# Patient Record
Sex: Male | Born: 1935 | Race: Black or African American | Hispanic: No | Marital: Married | Smoking: Never smoker
Health system: Southern US, Community
[De-identification: ages and names within clinical notes are randomized; demographics above are authoritative.]

## PROBLEM LIST (undated history)

## (undated) DIAGNOSIS — E119 Type 2 diabetes mellitus without complications: Secondary | ICD-10-CM

## (undated) DIAGNOSIS — K439 Ventral hernia without obstruction or gangrene: Secondary | ICD-10-CM

## (undated) DIAGNOSIS — E782 Mixed hyperlipidemia: Secondary | ICD-10-CM

## (undated) DIAGNOSIS — R809 Proteinuria, unspecified: Secondary | ICD-10-CM

## (undated) DIAGNOSIS — F329 Major depressive disorder, single episode, unspecified: Secondary | ICD-10-CM

## (undated) DIAGNOSIS — I2510 Atherosclerotic heart disease of native coronary artery without angina pectoris: Secondary | ICD-10-CM

## (undated) DIAGNOSIS — I639 Cerebral infarction, unspecified: Secondary | ICD-10-CM

## (undated) DIAGNOSIS — E039 Hypothyroidism, unspecified: Secondary | ICD-10-CM

## (undated) DIAGNOSIS — N400 Enlarged prostate without lower urinary tract symptoms: Secondary | ICD-10-CM

## (undated) DIAGNOSIS — F32A Depression, unspecified: Secondary | ICD-10-CM

## (undated) DIAGNOSIS — I10 Essential (primary) hypertension: Secondary | ICD-10-CM

## (undated) DIAGNOSIS — K279 Peptic ulcer, site unspecified, unspecified as acute or chronic, without hemorrhage or perforation: Secondary | ICD-10-CM

## (undated) DIAGNOSIS — K420 Umbilical hernia with obstruction, without gangrene: Secondary | ICD-10-CM

## (undated) HISTORY — DX: Ventral hernia without obstruction or gangrene: K43.9

## (undated) HISTORY — DX: Depression, unspecified: F32.A

## (undated) HISTORY — DX: Mixed hyperlipidemia: E78.2

## (undated) HISTORY — DX: Proteinuria, unspecified: R80.9

## (undated) HISTORY — DX: Essential (primary) hypertension: I10

## (undated) HISTORY — DX: Major depressive disorder, single episode, unspecified: F32.9

## (undated) HISTORY — DX: Benign prostatic hyperplasia without lower urinary tract symptoms: N40.0

## (undated) HISTORY — DX: Umbilical hernia with obstruction, without gangrene: K42.0

## (undated) HISTORY — DX: Peptic ulcer, site unspecified, unspecified as acute or chronic, without hemorrhage or perforation: K27.9

## (undated) HISTORY — PX: MULTIPLE TOOTH EXTRACTIONS: SHX2053

## (undated) HISTORY — DX: Type 2 diabetes mellitus without complications: E11.9

## (undated) HISTORY — DX: Atherosclerotic heart disease of native coronary artery without angina pectoris: I25.10

## (undated) HISTORY — DX: Cerebral infarction, unspecified: I63.9

## (undated) HISTORY — DX: Hypothyroidism, unspecified: E03.9

---

## 2007-01-15 ENCOUNTER — Encounter: Payer: Self-pay | Admitting: Cardiology

## 2008-09-16 ENCOUNTER — Encounter (INDEPENDENT_AMBULATORY_CARE_PROVIDER_SITE_OTHER): Payer: Self-pay | Admitting: Emergency Medicine

## 2008-09-16 ENCOUNTER — Emergency Department (HOSPITAL_COMMUNITY): Admission: EM | Admit: 2008-09-16 | Discharge: 2008-09-16 | Payer: Self-pay | Admitting: Emergency Medicine

## 2008-09-16 ENCOUNTER — Ambulatory Visit: Payer: Self-pay | Admitting: Surgery

## 2008-09-29 ENCOUNTER — Ambulatory Visit: Payer: Self-pay | Admitting: Family Medicine

## 2008-09-29 DIAGNOSIS — E1122 Type 2 diabetes mellitus with diabetic chronic kidney disease: Secondary | ICD-10-CM

## 2008-09-29 DIAGNOSIS — I10 Essential (primary) hypertension: Secondary | ICD-10-CM | POA: Insufficient documentation

## 2008-09-29 DIAGNOSIS — N400 Enlarged prostate without lower urinary tract symptoms: Secondary | ICD-10-CM | POA: Insufficient documentation

## 2008-09-29 DIAGNOSIS — Z794 Long term (current) use of insulin: Secondary | ICD-10-CM

## 2008-09-29 DIAGNOSIS — N183 Chronic kidney disease, stage 3 (moderate): Secondary | ICD-10-CM

## 2008-09-29 DIAGNOSIS — E6609 Other obesity due to excess calories: Secondary | ICD-10-CM

## 2008-09-29 DIAGNOSIS — Z6833 Body mass index (BMI) 33.0-33.9, adult: Secondary | ICD-10-CM

## 2008-09-29 DIAGNOSIS — M109 Gout, unspecified: Secondary | ICD-10-CM | POA: Insufficient documentation

## 2008-09-29 HISTORY — DX: Other obesity due to excess calories: E66.09

## 2008-09-29 HISTORY — DX: Type 2 diabetes mellitus with diabetic chronic kidney disease: E11.22

## 2008-09-29 LAB — CONVERTED CEMR LAB
Hgb A1c MFr Bld: 7.9 %
Protein, U semiquant: 30
Urobilinogen, UA: 1
pH: 5.5

## 2008-10-04 ENCOUNTER — Encounter (INDEPENDENT_AMBULATORY_CARE_PROVIDER_SITE_OTHER): Payer: Self-pay | Admitting: Family Medicine

## 2008-10-06 ENCOUNTER — Encounter (INDEPENDENT_AMBULATORY_CARE_PROVIDER_SITE_OTHER): Payer: Self-pay | Admitting: Family Medicine

## 2008-10-09 LAB — CONVERTED CEMR LAB
BUN: 24 mg/dL — ABNORMAL HIGH (ref 6–23)
CO2: 23 meq/L (ref 19–32)
Calcium: 9.1 mg/dL (ref 8.4–10.5)
Chloride: 105 meq/L (ref 96–112)
Cholesterol: 216 mg/dL — ABNORMAL HIGH (ref 0–200)
Creatinine, Ser: 1.26 mg/dL (ref 0.40–1.50)
Eosinophils Absolute: 0.1 10*3/uL (ref 0.0–0.7)
Eosinophils Relative: 2 % (ref 0–5)
Glucose, Bld: 159 mg/dL — ABNORMAL HIGH (ref 70–99)
HCT: 39.3 % (ref 39.0–52.0)
HDL: 56 mg/dL (ref >39)
Lymphs Abs: 1.2 10*3/uL (ref 0.7–4.0)
MCV: 86.4 fL (ref 78.0–100.0)
Monocytes Absolute: 0.2 10*3/uL (ref 0.1–1.0)
Monocytes Relative: 5 % (ref 3–12)
Platelets: 195 10*3/uL (ref 150–400)
RBC: 4.55 M/uL (ref 4.22–5.81)
Total CHOL/HDL Ratio: 3.9
WBC: 3.8 10*3/uL — ABNORMAL LOW (ref 4.0–10.5)

## 2008-10-13 ENCOUNTER — Ambulatory Visit: Payer: Self-pay | Admitting: Family Medicine

## 2008-10-13 DIAGNOSIS — E782 Mixed hyperlipidemia: Secondary | ICD-10-CM | POA: Insufficient documentation

## 2008-10-13 LAB — CONVERTED CEMR LAB: HDL goal, serum: 40 mg/dL

## 2008-10-30 ENCOUNTER — Ambulatory Visit: Payer: Self-pay | Admitting: Internal Medicine

## 2008-10-30 LAB — CONVERTED CEMR LAB
Bilirubin Urine: NEGATIVE
Blood Glucose, Fingerstick: 147
Ketones, urine, test strip: NEGATIVE
Nitrite: NEGATIVE
Specific Gravity, Urine: 1.025

## 2008-11-09 ENCOUNTER — Encounter (INDEPENDENT_AMBULATORY_CARE_PROVIDER_SITE_OTHER): Payer: Self-pay | Admitting: Family Medicine

## 2008-11-21 ENCOUNTER — Ambulatory Visit: Payer: Self-pay | Admitting: Family Medicine

## 2008-11-21 DIAGNOSIS — E1149 Type 2 diabetes mellitus with other diabetic neurological complication: Secondary | ICD-10-CM | POA: Insufficient documentation

## 2008-11-21 LAB — CONVERTED CEMR LAB: Glucose, Bld: 223 mg/dL

## 2008-11-22 ENCOUNTER — Encounter (INDEPENDENT_AMBULATORY_CARE_PROVIDER_SITE_OTHER): Payer: Self-pay | Admitting: Family Medicine

## 2008-11-22 LAB — CONVERTED CEMR LAB: TSH: 8.147 microintl units/mL — ABNORMAL HIGH (ref 0.350–4.500)

## 2008-11-29 ENCOUNTER — Ambulatory Visit: Payer: Self-pay | Admitting: Family Medicine

## 2008-11-30 ENCOUNTER — Encounter (INDEPENDENT_AMBULATORY_CARE_PROVIDER_SITE_OTHER): Payer: Self-pay | Admitting: *Deleted

## 2008-12-02 ENCOUNTER — Inpatient Hospital Stay (HOSPITAL_COMMUNITY): Admission: EM | Admit: 2008-12-02 | Discharge: 2008-12-04 | Payer: Self-pay | Admitting: Emergency Medicine

## 2008-12-02 ENCOUNTER — Telehealth (INDEPENDENT_AMBULATORY_CARE_PROVIDER_SITE_OTHER): Payer: Self-pay | Admitting: Family Medicine

## 2008-12-10 ENCOUNTER — Emergency Department (HOSPITAL_COMMUNITY): Admission: EM | Admit: 2008-12-10 | Discharge: 2008-12-10 | Payer: Self-pay

## 2008-12-13 ENCOUNTER — Encounter (INDEPENDENT_AMBULATORY_CARE_PROVIDER_SITE_OTHER): Payer: Self-pay | Admitting: Family Medicine

## 2008-12-21 ENCOUNTER — Telehealth (INDEPENDENT_AMBULATORY_CARE_PROVIDER_SITE_OTHER): Payer: Self-pay | Admitting: *Deleted

## 2008-12-25 ENCOUNTER — Telehealth (INDEPENDENT_AMBULATORY_CARE_PROVIDER_SITE_OTHER): Payer: Self-pay | Admitting: *Deleted

## 2009-01-02 ENCOUNTER — Ambulatory Visit: Payer: Self-pay | Admitting: Family Medicine

## 2009-01-11 ENCOUNTER — Encounter (INDEPENDENT_AMBULATORY_CARE_PROVIDER_SITE_OTHER): Payer: Self-pay | Admitting: *Deleted

## 2009-01-11 LAB — CONVERTED CEMR LAB
Albumin: 4 g/dL (ref 3.5–5.2)
Alkaline Phosphatase: 67 units/L (ref 39–117)
BUN: 20 mg/dL (ref 6–23)
CO2: 26 meq/L (ref 19–32)
Cholesterol: 156 mg/dL (ref 0–200)
Glucose, Bld: 117 mg/dL — ABNORMAL HIGH (ref 70–99)
HDL: 52 mg/dL (ref >39)
LDL Cholesterol: 94 mg/dL (ref 0–99)
Potassium: 5.7 meq/L — ABNORMAL HIGH (ref 3.5–5.3)
Triglycerides: 52 mg/dL (ref <150)

## 2009-01-16 ENCOUNTER — Ambulatory Visit: Payer: Self-pay | Admitting: Family Medicine

## 2009-01-16 ENCOUNTER — Ambulatory Visit (HOSPITAL_COMMUNITY): Admission: RE | Admit: 2009-01-16 | Discharge: 2009-01-16 | Payer: Self-pay | Admitting: Family Medicine

## 2009-01-16 LAB — CONVERTED CEMR LAB
Cholesterol, target level: 200 mg/dL
HDL goal, serum: 40 mg/dL
LDL Goal: 70 mg/dL

## 2009-01-17 ENCOUNTER — Encounter (INDEPENDENT_AMBULATORY_CARE_PROVIDER_SITE_OTHER): Payer: Self-pay | Admitting: Family Medicine

## 2009-01-17 LAB — CONVERTED CEMR LAB
Potassium: 5.3 meq/L (ref 3.5–5.3)
Sed Rate: 24 mm/hr — ABNORMAL HIGH (ref 0–16)

## 2009-01-22 ENCOUNTER — Telehealth (INDEPENDENT_AMBULATORY_CARE_PROVIDER_SITE_OTHER): Payer: Self-pay | Admitting: *Deleted

## 2009-01-23 ENCOUNTER — Encounter (INDEPENDENT_AMBULATORY_CARE_PROVIDER_SITE_OTHER): Payer: Self-pay | Admitting: Family Medicine

## 2009-01-24 ENCOUNTER — Telehealth (INDEPENDENT_AMBULATORY_CARE_PROVIDER_SITE_OTHER): Payer: Self-pay | Admitting: *Deleted

## 2009-01-29 ENCOUNTER — Telehealth (INDEPENDENT_AMBULATORY_CARE_PROVIDER_SITE_OTHER): Payer: Self-pay | Admitting: *Deleted

## 2009-02-15 ENCOUNTER — Encounter (INDEPENDENT_AMBULATORY_CARE_PROVIDER_SITE_OTHER): Payer: Self-pay | Admitting: Family Medicine

## 2009-02-19 ENCOUNTER — Ambulatory Visit: Payer: Self-pay | Admitting: Family Medicine

## 2009-02-27 ENCOUNTER — Ambulatory Visit: Payer: Self-pay | Admitting: Family Medicine

## 2009-02-27 ENCOUNTER — Telehealth (INDEPENDENT_AMBULATORY_CARE_PROVIDER_SITE_OTHER): Payer: Self-pay | Admitting: *Deleted

## 2009-02-28 ENCOUNTER — Encounter (INDEPENDENT_AMBULATORY_CARE_PROVIDER_SITE_OTHER): Payer: Self-pay | Admitting: Family Medicine

## 2009-02-28 LAB — CONVERTED CEMR LAB
Calcium: 8.7 mg/dL (ref 8.4–10.5)
Creatinine, Ser: 1.34 mg/dL (ref 0.40–1.50)
Sodium: 140 meq/L (ref 135–145)

## 2009-03-01 LAB — CONVERTED CEMR LAB: TSH: 2.161 microintl units/mL (ref 0.350–4.500)

## 2009-03-20 ENCOUNTER — Ambulatory Visit (HOSPITAL_COMMUNITY): Admission: RE | Admit: 2009-03-20 | Discharge: 2009-03-20 | Payer: Self-pay | Admitting: Ophthalmology

## 2009-04-25 ENCOUNTER — Encounter: Payer: Self-pay | Admitting: Cardiology

## 2010-04-15 ENCOUNTER — Encounter: Payer: Self-pay | Admitting: Cardiology

## 2010-04-16 ENCOUNTER — Ambulatory Visit: Payer: Self-pay | Admitting: Cardiology

## 2010-07-17 ENCOUNTER — Encounter: Payer: Self-pay | Admitting: Cardiology

## 2010-08-08 NOTE — Letter (Signed)
Summary: External Correspondence/ [facility]  External Correspondence/ [facility]   Imported By: Bartholomew Boards 07/25/2010 16:09:29  _____________________________________________________________________  External Attachment:    Type:   Image     Comment:   External Document

## 2010-08-30 ENCOUNTER — Encounter: Payer: Self-pay | Admitting: Physician Assistant

## 2010-08-30 ENCOUNTER — Ambulatory Visit (INDEPENDENT_AMBULATORY_CARE_PROVIDER_SITE_OTHER): Payer: MEDICARE | Admitting: Cardiology

## 2010-08-30 DIAGNOSIS — R0609 Other forms of dyspnea: Secondary | ICD-10-CM | POA: Insufficient documentation

## 2010-08-30 DIAGNOSIS — R0602 Shortness of breath: Secondary | ICD-10-CM

## 2010-08-30 DIAGNOSIS — I10 Essential (primary) hypertension: Secondary | ICD-10-CM

## 2010-08-30 DIAGNOSIS — R0989 Other specified symptoms and signs involving the circulatory and respiratory systems: Secondary | ICD-10-CM | POA: Insufficient documentation

## 2010-09-03 ENCOUNTER — Encounter (INDEPENDENT_AMBULATORY_CARE_PROVIDER_SITE_OTHER): Payer: MEDICARE

## 2010-09-03 DIAGNOSIS — R002 Palpitations: Secondary | ICD-10-CM

## 2010-09-03 NOTE — Assessment & Plan Note (Signed)
Summary: np6 hypertension cardiomy low et/ac   Visit Type:  Initial Consult Primary Provider:  Judd Lien, MD   History of Present Illness: 77 year old male presents for initial consultation, for evaluation of chronic DOE.  Patient has never had a formal cardiac evaluation, but did have an exercise stress Cardiolite, here at [facility], in October 2011, per Dr. Lizbeth Bark request. This was reviewed by Dr. Dannielle Burn, who concluded that this was most likely consistent with hypertensive, dilated cardio myopathy. The study yielded moderate LVD (EF 40%), with moderate global HK; a small, fixed inferobasal defect, associated with moderate HK, consistent with attenuation. there was also severe hypertensive response (233/108), at peak stress.  Clinically, the patient denies any history of exertional chest discomfort. What he has is significant DOE with prolonged walking, which is then associated with nausea and vomiting. He denies any shortness of breath at rest, or orthopnea, PND, or significant lower extremity edema. He also denies any significant progression of these symptoms, over the past 6 months.  Preventive Screening-Counseling & Management  Alcohol-Tobacco     Smoking Status: never  Current Medications (verified): 1)  Metformin Hcl 1000 Mg Tabs (Metformin Hcl) .... Take 1 Tablet By Mouth Two Times A Day 2)  Citalopram Hydrobromide 20 Mg Tabs (Citalopram Hydrobromide) .... One Daily For Anxiety/depression 3)  Lisinopril 20 Mg Tabs (Lisinopril) .... One Daily 4)  Multivitamins  Tabs (Multiple Vitamin) .... Once Daily 5)  Aspirin 81 Mg Tbec (Aspirin) .... Once Daily 6)  Norvasc 10 Mg Tabs (Amlodipine Besylate) .... One Daily 7)  Levothyroxine Sodium 50 Mcg Tabs (Levothyroxine Sodium) .... Take 1 Tablet By Mouth Once A Day 8)  Allopurinol 300 Mg Tabs (Allopurinol) .... One Daily 9)  Lantus Solostar 100 Unit/ml Soln (Insulin Glargine) .... 20 Units Subcutaneously At Bedtime 10)  Pen Needles  29g X 80mm Misc (Insulin Pen Needle) .... Use As Directed 11)  Pravachol 20 Mg Tabs (Pravastatin Sodium) .... One Daily 12)  Colchicine 0.6 Mg Tabs (Colchicine) .... Two Times A Day 13)  Tamsulosin Hcl 0.4 Mg Caps (Tamsulosin Hcl) .... Take 1 Tablet By Mouth Once A Day  Allergies (verified): No Known Drug Allergies  Past History:  Past Medical History: Last updated: 10/30/2008 UNSPECIFIED HYPOTHYROIDISM (ICD-244.9) HYPERLIPIDEMIA (ICD-272.4) OBESITY (ICD-278.00) MEMORY LOSS (ICD-780.93) VENTRAL HERNIA (ICD-553.20) HEARING LOSS (ICD-389.9) PROTEINURIA, MILD (ICD-791.0) GOUT (ICD-274.9) HYPERTROPHY PROSTATE W/O UR OBST & OTH LUTS (ICD-600.00) PUD (ICD-533.90) CEREBROVASCULAR ACCIDENT, HX OF 2008 (ICD-V12.50) DEPRESSION (ICD-311) HYPERTENSION (ICD-401.9) DIABETES MELLITUS, TYPE II, UNCONTROLLED (ICD-250.02)  Review of Systems       No fevers, chills, hemoptysis, dysphagia, melena, hematocheezia, hematuria, rash, orthopnea, pnd, pedal edema. Complains of occasional palpitations. Denies intermittent claudication. All other systems negative.   Vital Signs:  Patient profile:   77 year old male Height:      74 inches Weight:      275.50 pounds BMI:     35.50 Pulse rate:   69 / minute BP sitting:   179 / 83  (left arm) Cuff size:   regular  Vitals Entered By: Lovina Reach, LPN (February 24, [id number] 1:10 PM) Is Patient Diabetic? Yes   Physical Exam  Additional Exam:  GEN: 38er old male, morbidly obese, no distress HEENT: NCAT,PERRLA,EOMI NECK: palpable pulses, no bruits; unable to assess JVD, secondary to neck girth LUNGS: CTA bilaterally HEART: RRR (S1S2); no significant murmurs; premature beats ABD: soft, NT; intact BS EXT: nonpalpable peripheral pulses; no significant edema MUSC: no obvious deformity NEURO: A/O (x3)  EKG  Procedure date:  08/30/2010  Findings:      a 12-lead EKG, per Dr. Lizbeth Bark office, indicates normal sinus rhythm with frequent single PVCs;  LAD; borderline first degree AV block  Impression & Recommendations:  Problem # 1:  DYSPNEA ON EXERTION (ICD-786.09)  we'll order a 2-D echocardiogram for reassessment of LV function, and rule out underlying structural abnormalities. We suspected that based on his previous stress test result, he may, in fact, have a nonischemic cardiomyopathy, secondary to hypertension. he will ultimately require coronary angiography to exclude significant CAD, and will address this issue when he returns to [place] for early followup. With respect to medications, we will start carvedilol 3.125 mg b.i.d., for aggressive BP control, as well as initial treatment for his cardiomyopathy. Of note, he is already on an ACE inhibitor.  Orders: T-TSH [phone]) Holter Monitor (Holter Monitor) 2-D Echocardiogram (2D Echo)  Problem # 2:  PALPITATIONS (ICD-785.1)  we'll further evaluate with a 48 hour Holter monitor. Check TSH level.  Orders: T-TSH [phone]) Holter Monitor (Holter Monitor)  Problem # 3:  HYPERTENSION (ICD-401.9)  have initiated carvedilol for aggressive management.  Problem # 4:  DIABETES MELLITUS, TYPE II, UNCONTROLLED (ICD-250.02) Assessment: Comment Only  Problem # 5:  HYPERLIPIDEMIA (ICD-272.4) Assessment: Comment Only  His updated medication list for this problem includes:    Pravachol 20 Mg Tabs (Pravastatin sodium) ..... One daily  His updated medication list for this problem includes:    Pravachol 20 Mg Tabs (Pravastatin sodium) ..... One daily  Patient Instructions: 1)  Follow up with Dr. Domenic Polite on  FRIDAY October 04, 2010 AT [place]. 2)  Your physician has requested that you have an echocardiogram.  Echocardiography is a painless test that uses sound waves to create images of your heart. It provides your doctor with information about the size and shape of your heart and how well your heart's chambers and valves are working.  This procedure takes approximately one hour. There are no  restrictions for this procedure. 3)  Your physician has recommended that you wear a holter monitor.  Holter monitors are medical devices that record the heart's electrical activity. Doctors most often use these monitors to diagnose arrhythmias. Arrhythmias are problems with the speed or rhythm of the heartbeat. The monitor is a small, portable device. You can wear one while you do your normal daily activities. This is usually used to diagnose what is causing palpitations/syncope (passing out). 4)  Your physician recommends that you go to the [facility] for lab work: TSH. 5)  Start Coreg (carvedilol) 3.125mg  by mouth two times a day. Prescriptions: CARVEDILOL 3.125 MG TABS (CARVEDILOL) Take one tablet by mouth twice a day  #60 x 6   Entered by:   Gurney Maxin, RN, BSN   Authorized by:   Maryjane Hurter, PA-C   Signed by:   Gurney Maxin, RN, BSN on 08/30/2010   Method used:   Electronically to        [facility] (retail)       [address]       [place], [place]  [address]       Ph: [phone]       Fax: [phone]   RxIDGO:[id number]   Handout requested.  I have reviewed and approved all prescriptions at the time of the office visit. Maryjane Hurter, PA-C  August 30, 2010 3:12 PM

## 2010-09-05 ENCOUNTER — Encounter: Payer: Self-pay | Admitting: Physician Assistant

## 2010-09-05 DIAGNOSIS — R0602 Shortness of breath: Secondary | ICD-10-CM

## 2010-09-09 ENCOUNTER — Encounter: Payer: Self-pay | Admitting: Physician Assistant

## 2010-09-12 NOTE — Letter (Signed)
Summary: Internal Other/ PATIENT HISTORY FORM  Internal Other/ PATIENT HISTORY FORM   Imported By: Bartholomew Boards 09/02/2010 15:18:05  _____________________________________________________________________  External Attachment:    Type:   Image     Comment:   External Document

## 2010-09-17 NOTE — Procedures (Addendum)
Summary: Holter Final Summary  Holter Final Summary   Imported By: Gurney Maxin, RN, BSN 09/11/2010 11:21:13  _____________________________________________________________________  External Attachment:    Type:   Image     Comment:   External Document  Appended Document: Holter Final Summary Left message to call back on voicemail.  Appended Document: Holter Final Summary Pt notified of results and verbalized understanding.

## 2010-09-20 ENCOUNTER — Encounter: Payer: Self-pay | Admitting: Physician Assistant

## 2010-09-23 ENCOUNTER — Encounter: Payer: Self-pay | Admitting: *Deleted

## 2010-10-02 ENCOUNTER — Encounter: Payer: Self-pay | Admitting: *Deleted

## 2010-10-03 ENCOUNTER — Encounter: Payer: Self-pay | Admitting: Cardiology

## 2010-10-03 NOTE — Letter (Signed)
Summary: Risk analyst at [facility]. [address]   [place], [place] [address]   Phone: [phone]  Fax: [phone]        September 23, 2010 MRN: [id number]    [facility] [address] [place], [place]  [address]    Dear Mr. Rodick,  Your test ordered by Rande Lawman has been reviewed by your physician (or physician assistant) and was found to be normal or stable. Your physician (or physician assistant) felt no changes were needed at this time.  ____ Echocardiogram  ____ Cardiac Stress Test  __X__ Lab Work-T3 NORMAL-FURTHER THYROID F/U WITH DR. Pleas Koch  ____ Peripheral vascular study of arms, legs or neck  ____ CT scan or X-ray  ____ Lung or Breathing test  ____ Other:   Thank you.   Gurney Maxin, RN, BSN    Bryon Lions, M.D., F.A.C.C. Maceo Pro, M.D., F.A.C.C. Cammy Copa, M.D., F.A.C.C. Vonda Antigua, M.D., F.A.C.C. Vita Barley, M.D., F.A.C.C. Mare Ferrari, M.D., F.A.C.C. Emi Belfast, PA-C

## 2010-10-04 ENCOUNTER — Encounter: Payer: Self-pay | Admitting: Cardiology

## 2010-10-04 ENCOUNTER — Encounter: Payer: Self-pay | Admitting: *Deleted

## 2010-10-04 ENCOUNTER — Ambulatory Visit (INDEPENDENT_AMBULATORY_CARE_PROVIDER_SITE_OTHER): Payer: MEDICARE | Admitting: Cardiology

## 2010-10-04 VITALS — BP 161/82 | HR 71 | Ht 74.0 in | Wt 271.0 lb

## 2010-10-04 DIAGNOSIS — R0989 Other specified symptoms and signs involving the circulatory and respiratory systems: Secondary | ICD-10-CM

## 2010-10-04 DIAGNOSIS — I10 Essential (primary) hypertension: Secondary | ICD-10-CM

## 2010-10-04 DIAGNOSIS — R0602 Shortness of breath: Secondary | ICD-10-CM

## 2010-10-04 DIAGNOSIS — R002 Palpitations: Secondary | ICD-10-CM

## 2010-10-04 DIAGNOSIS — R9430 Abnormal result of cardiovascular function study, unspecified: Secondary | ICD-10-CM

## 2010-10-04 NOTE — Assessment & Plan Note (Signed)
Holter monitor reviewed, showing both ventricular and atrial ectopy, some frequent ventricular ectopy with bigeminy and couplets. No sustained events.

## 2010-10-04 NOTE — Assessment & Plan Note (Signed)
Blood pressure trend is better, although not yet optimal. Coreg was most recent addition to therapy.

## 2010-10-04 NOTE — Progress Notes (Signed)
Clinical Summary Paul Wood is a 77 y.o.male presenting for routine followup. He was seen recently by Paul Wood in late February with history of chronic dyspnea on exertion. Prior exercise Cardiolite from October 2011 had indicated possible nonischemic cardiomyopathy with LVEF 40% and global hypokinesis. Followup echocardiogram however revealed an LVEF of 55-60% with mild LVH, mild left atrial enlargement, normal right ventricular function, and mild aortic root dilatation.  Followup lab work included a TSH found to be 0.10 and normal T3 uptake of 45. Holter monitor showed sinus rhythm ranging from 45 beats per minute to 124 beats per minute, frequent PVCs with bigeminy and some couplets, brief PAT.  He continues to report NYHA class III dyspnea on exertion. This has been functionally limiting over the last several months. He denies any exertional chest pain at this point. No dizziness or syncope. He states that these symptoms have been getting worse over the last few years, and that he used to walk regularly, up to several miles at a time with no limitation.   No Known Allergies   Current outpatient prescriptions:allopurinol (ZYLOPRIM) 300 MG tablet, Take 300 mg by mouth daily.  , Disp: , Rfl: ;  amLODipine (NORVASC) 10 MG tablet, Take 10 mg by mouth daily.  , Disp: , Rfl: ;  aspirin 81 MG tablet, Take 81 mg by mouth daily.  , Disp: , Rfl: ;  carvedilol (COREG) 3.125 MG tablet, Take 3.125 mg by mouth 2 (two) times daily with a meal.  , Disp: , Rfl:  citalopram (CELEXA) 20 MG tablet, Take 20 mg by mouth daily.  , Disp: , Rfl: ;  colchicine 0.6 MG tablet, Take one by mouth twice daily  , Disp: , Rfl: ;  insulin glargine (LANTUS SOLOSTAR) 100 UNIT/ML injection, Inject into the skin at bedtime.  , Disp: , Rfl: ;  levothyroxine (SYNTHROID, LEVOTHROID) 50 MCG tablet, Take 50 mcg by mouth daily.  , Disp: , Rfl: ;  lisinopril (PRINIVIL,ZESTRIL) 20 MG tablet, Take 20 mg by mouth daily.  , Disp: , Rfl:  metFORMIN  (GLUCOPHAGE) 1000 MG tablet, Take 1,000 mg by mouth 2 (two) times daily with a meal.  , Disp: , Rfl: ;  Multiple Vitamin (MULTIVITAMIN) tablet, Take 1 tablet by mouth daily.  , Disp: , Rfl: ;  pravastatin (PRAVACHOL) 20 MG tablet, Take 20 mg by mouth daily.  , Disp: , Rfl: ;  Tamsulosin HCl (FLOMAX) 0.4 MG CAPS, Take one by mouth daily  , Disp: , Rfl:    Past Medical History  Diagnosis Date  . Hypothyroidism   . Mixed hyperlipidemia   . Ventral hernia   . Hearing loss   . Proteinuria     Mild  . Gout   . Hypertrophy of prostate without urinary obstruction and other lower urinary tract symptoms (LUTS)   . PUD (peptic ulcer disease)   . Stroke     2008  . Depression   . Essential hypertension, benign   . Type 2 diabetes mellitus     Past Surgical History  Procedure Date  . Multiple tooth extractions     HX DENTAL CARIES/ALMOST ALL REMOVED    Family History  Problem Relation Age of Onset  . Gout Father     DIED AGE 87-HOUSE FIRE  . Alzheimer's disease Mother     DIED AGE 65/uncertain hx  . Other Brother     2 brothers deceased:1-gun shot wound & 1-hypothermia(froze to death)  . Colon cancer Sister  56 sisters:1-colon cancer age 46's  . Gout Son     age 20  . Hypertension Son     age 14  . Hyperlipidemia Son     age 68  . Other Daughter     age 59:leg problems,Knee OA    Social History Paul Wood reports that he has never smoked. He has never used smokeless tobacco. Paul Wood reports that he does not drink alcohol.  Review of Systems No fevers, chills, or unusual weight change. No chest pain, cough, hemoptysis, or wheezing. No syncope. No dysphasia or odynophagia. Stable appetite with no abdominal pain, melena, or hematochezia. No orthopnea, PND, or lower extremity edema. No focal motor weakness, memory problems, or speech deficits. Otherwise systems reviewed and negative except as already outlined.   Physical Examination Filed Vitals:   10/04/10 1104  BP:  161/82  Pulse: 71  GEN: 78er old male, morbidly obese, no distress HEENT: NCAT,PERRLA,EOMI NECK: palpable pulses, no bruits; unable to assess JVD, secondary to neck girth LUNGS: CTA bilaterally HEART: RRR (S1S2); no significant murmurs; premature beats ABD: soft, NT; intact BS EXT: nonpalpable peripheral pulses; no significant edema MUSC: no obvious deformity NEURO: A/O (x3)    Problem List and Plan

## 2010-10-04 NOTE — Assessment & Plan Note (Signed)
As noted below, plan is to pursue further testing, specifically a left and right heart catheterization for definitive ischemic evaluation and hemodynamic workup. LVEF is normal by followup echocardiogram.

## 2010-10-04 NOTE — Patient Instructions (Signed)
Follow up as scheduled. Your physician recommends that you go to the [facility] for lab work and a chest x-ray. Do on Monday or Tuesday (4/2-4/3). Your physician has requested that you have a cardiac catheterization. Cardiac catheterization is used to diagnose and/or treat various heart conditions. Doctors may recommend this procedure for a number of different reasons. The most common reason is to evaluate chest pain. Chest pain can be a symptom of coronary artery disease (CAD), and cardiac catheterization can show whether plaque is narrowing or blocking your heart's arteries. This procedure is also used to evaluate the valves, as well as measure the blood flow and oxygen levels in different parts of your heart. For further information please visit [email or web address]. Please follow instruction sheet, as given.

## 2010-10-04 NOTE — Assessment & Plan Note (Signed)
Exercise Cardiolite from October 2011 suggested the possibility of a dilated cardiomyopathy with LVEF 40%, however recent echocardiogram shows normal function. Fixed inferobasal defect was also described, possibly attenuation. Patient was significantly hypertensive at that point. TID Ratio was somewhat increased at 1.17. In light of continued dyspnea on exertion, plan is to proceed with a left and right heart catheterization to clearly assess coronary anatomy, exclude potential for balanced ischemia, and also assess hemodynamics. Risks and benefits were discussed, and patient is in agreement to proceed.

## 2010-10-07 LAB — PROTIME-INR

## 2010-10-11 LAB — BASIC METABOLIC PANEL
Calcium: 9.2 mg/dL (ref 8.4–10.5)
Creatinine, Ser: 1.4 mg/dL (ref 0.4–1.5)
GFR calc Af Amer: 60 mL/min — ABNORMAL LOW (ref >60)
GFR calc non Af Amer: 49 mL/min — ABNORMAL LOW (ref >60)
Sodium: 143 mEq/L (ref 135–145)

## 2010-10-11 LAB — GLUCOSE, CAPILLARY: Glucose-Capillary: 86 mg/dL (ref 70–99)

## 2010-10-11 LAB — HEMOGLOBIN AND HEMATOCRIT, BLOOD: HCT: 34.2 % — ABNORMAL LOW (ref 39.0–52.0)

## 2010-10-14 ENCOUNTER — Inpatient Hospital Stay (HOSPITAL_BASED_OUTPATIENT_CLINIC_OR_DEPARTMENT_OTHER)
Admission: RE | Admit: 2010-10-14 | Discharge: 2010-10-14 | Disposition: A | Discharge status: Other Acute Inpatient Hospital | Payer: MEDICARE | Source: Ambulatory Visit | Attending: Cardiovascular Disease | Admitting: Cardiovascular Disease

## 2010-10-14 ENCOUNTER — Observation Stay (HOSPITAL_COMMUNITY)
Admission: RE | Admit: 2010-10-14 | Discharge: 2010-10-15 | Disposition: A | Discharge status: Home / Self Care | Payer: MEDICARE | Source: Ambulatory Visit | Attending: Cardiovascular Disease | Admitting: Cardiovascular Disease

## 2010-10-14 DIAGNOSIS — E785 Hyperlipidemia, unspecified: Secondary | ICD-10-CM | POA: Insufficient documentation

## 2010-10-14 DIAGNOSIS — Z8711 Personal history of peptic ulcer disease: Secondary | ICD-10-CM | POA: Insufficient documentation

## 2010-10-14 DIAGNOSIS — E119 Type 2 diabetes mellitus without complications: Secondary | ICD-10-CM | POA: Insufficient documentation

## 2010-10-14 DIAGNOSIS — R0609 Other forms of dyspnea: Secondary | ICD-10-CM | POA: Insufficient documentation

## 2010-10-14 DIAGNOSIS — N289 Disorder of kidney and ureter, unspecified: Secondary | ICD-10-CM | POA: Insufficient documentation

## 2010-10-14 DIAGNOSIS — Z01810 Encounter for preprocedural cardiovascular examination: Secondary | ICD-10-CM | POA: Insufficient documentation

## 2010-10-14 DIAGNOSIS — F3289 Other specified depressive episodes: Secondary | ICD-10-CM | POA: Insufficient documentation

## 2010-10-14 DIAGNOSIS — I2510 Atherosclerotic heart disease of native coronary artery without angina pectoris: Secondary | ICD-10-CM

## 2010-10-14 DIAGNOSIS — I428 Other cardiomyopathies: Secondary | ICD-10-CM | POA: Insufficient documentation

## 2010-10-14 DIAGNOSIS — R0989 Other specified symptoms and signs involving the circulatory and respiratory systems: Secondary | ICD-10-CM | POA: Insufficient documentation

## 2010-10-14 DIAGNOSIS — F329 Major depressive disorder, single episode, unspecified: Secondary | ICD-10-CM | POA: Insufficient documentation

## 2010-10-14 DIAGNOSIS — I10 Essential (primary) hypertension: Secondary | ICD-10-CM | POA: Insufficient documentation

## 2010-10-14 DIAGNOSIS — E039 Hypothyroidism, unspecified: Secondary | ICD-10-CM | POA: Insufficient documentation

## 2010-10-14 DIAGNOSIS — M109 Gout, unspecified: Secondary | ICD-10-CM | POA: Insufficient documentation

## 2010-10-14 DIAGNOSIS — Z8673 Personal history of transient ischemic attack (TIA), and cerebral infarction without residual deficits: Secondary | ICD-10-CM | POA: Insufficient documentation

## 2010-10-14 DIAGNOSIS — R9439 Abnormal result of other cardiovascular function study: Secondary | ICD-10-CM | POA: Insufficient documentation

## 2010-10-15 DIAGNOSIS — I200 Unstable angina: Secondary | ICD-10-CM

## 2010-10-15 LAB — GLUCOSE, CAPILLARY
Glucose-Capillary: 102 mg/dL — ABNORMAL HIGH (ref 70–99)
Glucose-Capillary: 145 mg/dL — ABNORMAL HIGH (ref 70–99)
Glucose-Capillary: 184 mg/dL — ABNORMAL HIGH (ref 70–99)
Glucose-Capillary: 216 mg/dL — ABNORMAL HIGH (ref 70–99)
Glucose-Capillary: 228 mg/dL — ABNORMAL HIGH (ref 70–99)
Glucose-Capillary: 241 mg/dL — ABNORMAL HIGH (ref 70–99)
Glucose-Capillary: 286 mg/dL — ABNORMAL HIGH (ref 70–99)

## 2010-10-15 LAB — COMPREHENSIVE METABOLIC PANEL
ALT: 16 U/L (ref 0–53)
AST: 18 U/L (ref 0–37)
CO2: 28 mEq/L (ref 19–32)
Calcium: 9.2 mg/dL (ref 8.4–10.5)
Creatinine, Ser: 1.18 mg/dL (ref 0.4–1.5)
GFR calc Af Amer: >60 mL/min (ref >60)
GFR calc non Af Amer: >60 mL/min (ref >60)
Sodium: 138 mEq/L (ref 135–145)
Total Protein: 7.4 g/dL (ref 6.0–8.3)

## 2010-10-15 LAB — DIFFERENTIAL
Basophils Absolute: 0 10*3/uL (ref 0.0–0.1)
Basophils Relative: 0 % (ref 0–1)
Eosinophils Absolute: 0 10*3/uL (ref 0.0–0.7)
Eosinophils Absolute: 0 10*3/uL (ref 0.0–0.7)
Eosinophils Absolute: 0.1 10*3/uL (ref 0.0–0.7)
Eosinophils Relative: 1 % (ref 0–5)
Eosinophils Relative: 1 % (ref 0–5)
Lymphocytes Relative: 22 % (ref 12–46)
Lymphs Abs: 1.2 10*3/uL (ref 0.7–4.0)
Lymphs Abs: 1.3 10*3/uL (ref 0.7–4.0)
Lymphs Abs: 1.4 10*3/uL (ref 0.7–4.0)
Monocytes Absolute: 0.3 10*3/uL (ref 0.1–1.0)
Monocytes Absolute: 0.4 10*3/uL (ref 0.1–1.0)
Monocytes Relative: 6 % (ref 3–12)
Monocytes Relative: 6 % (ref 3–12)
Monocytes Relative: 6 % (ref 3–12)
Neutro Abs: 4.8 10*3/uL (ref 1.7–7.7)
Neutrophils Relative %: 65 % (ref 43–77)
Neutrophils Relative %: 72 % (ref 43–77)

## 2010-10-15 LAB — BASIC METABOLIC PANEL
CO2: 25 mEq/L (ref 19–32)
CO2: 28 mEq/L (ref 19–32)
Calcium: 8.6 mg/dL (ref 8.4–10.5)
Chloride: 102 mEq/L (ref 96–112)
Chloride: 105 mEq/L (ref 96–112)
Creatinine, Ser: 1.69 mg/dL — ABNORMAL HIGH (ref 0.4–1.5)
GFR calc Af Amer: 48 mL/min — ABNORMAL LOW (ref >60)
GFR calc Af Amer: >60 mL/min (ref >60)
GFR calc Af Amer: >60 mL/min (ref >60)
Potassium: 4.7 mEq/L (ref 3.5–5.1)
Potassium: 5 mEq/L (ref 3.5–5.1)

## 2010-10-15 LAB — CBC
HCT: 36.3 % — ABNORMAL LOW (ref 39.0–52.0)
HCT: 36.7 % — ABNORMAL LOW (ref 39.0–52.0)
HCT: 37.7 % — ABNORMAL LOW (ref 39.0–52.0)
Hemoglobin: 11.1 g/dL — ABNORMAL LOW (ref 13.0–17.0)
MCH: 27.5 pg (ref 26.0–34.0)
MCHC: 32.8 g/dL (ref 30.0–36.0)
MCHC: 34.2 g/dL (ref 30.0–36.0)
MCV: 85.1 fL (ref 78.0–100.0)
MCV: 85.2 fL (ref 78.0–100.0)
MCV: 85.4 fL (ref 78.0–100.0)
Platelets: 195 10*3/uL (ref 150–400)
RBC: 4.27 MIL/uL (ref 4.22–5.81)
RBC: 4.29 MIL/uL (ref 4.22–5.81)
RDW: 14.1 % (ref 11.5–15.5)
RDW: 14.6 % (ref 11.5–15.5)
WBC: 4.6 10*3/uL (ref 4.0–10.5)
WBC: 5.6 10*3/uL (ref 4.0–10.5)

## 2010-10-15 LAB — HEMOGLOBIN A1C
Hgb A1c MFr Bld: 9.2 % — ABNORMAL HIGH (ref 4.6–6.1)
Mean Plasma Glucose: 217 mg/dL

## 2010-10-15 LAB — CULTURE, BLOOD (ROUTINE X 2)
Culture: NO GROWTH
Report Status: 6032010

## 2010-10-15 LAB — POCT I-STAT 3, VENOUS BLOOD GAS (G3P V)
pCO2, Ven: 48.7 mmHg (ref 45.0–50.0)
pH, Ven: 7.362 — ABNORMAL HIGH (ref 7.250–7.300)

## 2010-10-15 LAB — POCT I-STAT 3, ART BLOOD GAS (G3+)
Bicarbonate: 25.7 mEq/L — ABNORMAL HIGH (ref 20.0–24.0)
O2 Saturation: 96 %
TCO2: 27 mmol/L (ref 0–100)
pCO2 arterial: 43.1 mmHg (ref 35.0–45.0)
pH, Arterial: 7.382 (ref 7.350–7.450)
pO2, Arterial: 85 mmHg (ref 80.0–100.0)

## 2010-10-15 LAB — URIC ACID: Uric Acid, Serum: 4.2 mg/dL (ref 4.0–7.8)

## 2010-10-15 LAB — POCT I-STAT GLUCOSE: Operator id: 221371

## 2010-10-15 NOTE — Procedures (Signed)
NAME:  Paul Wood, Paul Wood                ACCOUNT NO.:  [id number]  MEDICAL RECORD NO.:  [id number]           PATIENT TYPE:  O  LOCATION:  [id number]                         FACILITY:  [facility]  PHYSICIAN:  Lauree Chandler, MDDATE OF BIRTH:  16-Aug-1931  DATE OF PROCEDURE:  10/14/2010 DATE OF DISCHARGE:                           CARDIAC CATHETERIZATION   PROCEDURE PERFORMED: 1. Left heart catheterization. 2. Right heart catheterization. 3. Selective coronary angiography. 4. Left ventricular angiogram.  OPERATOR:  Lauree Chandler, MD  INDICATIONS:  This is a 77 year old African American male with a history of hypertension, diabetes mellitus, and hyperlipidemia, who was recently evaluated by Dr. Domenic Polite in the cardiology office with complaints of dyspnea.  The patient had nonischemic cardiomyopathy diagnosed last year; however, recent echocardiogram showed normal left ventricular systolic function.  Exercise Cardiolite in October 2011 showed the possibility of a dilated cardiomyopathy with EF of 40%.  There was a fixed inferobasal defect.  The patient has continued to have dyspnea, and because of this a diagnostic right and left heart catheterization was arranged.  PROCEDURE IN DETAIL:  The patient was brought to the outpatient cardiac catheterization laboratory after signing informed consent for the procedure.  The right groin was prepped and draped in sterile fashion. Lidocaine 1% was used for local anesthesia.  A 4-French sheath was inserted into the right femoral artery without difficulty.  A 6-French sheath was inserted into the right femoral vein without difficulty.  A right heart catheterization was performed with a multipurpose catheter. Selective coronary angiography was then performed.  Initially, a JL-4 catheter was used to engage the left main; however, this was not a good fit for the vessel.  I upsized to a JL-5 catheter which fit much better into the left main;  however, I felt that the catheter size was too small.  We then imaged the right coronary artery with a 3D RC catheter. We then upsized the sheath in the right femoral artery to a 5-French sheath.  We then used a 5-French JL-5 catheter to selectively engage and inject the left coronary system.  A pigtail catheter was used to perform a left ventricular angiogram.  The patient tolerated the procedure well.  HEMODYNAMIC FINDINGS:  Central aortic pressure 199/80, left ventricular pressure 197/21/32, right atrial pressure 4, right ventricular pressure 36/104, pulmonary artery pressure 41/11 with a mean of 24, pulmonary capillary wedge pressure mean of 10, central aortic saturation 96%, pulmonary artery saturation 61%, cardiac output 5.5 L per minute, cardiac index 2.2 L per minute per meter squared.  ANGIOGRAPHIC FINDINGS: 1. The left main artery was free of disease. 2. The left anterior descending was a large vessel that coursed to the     apex and gave off several diagonal branches.  The midportion of the     vessel appeared to have 90% stenosis. 3. The circumflex artery had moderate plaque throughout with a 60%     stenosis in the distal portion of an obtuse marginal branch. 4. The right coronary artery is a large dominant vessel with a 99% mid     stenosis. 5. Left ventricular angiogram was performed in the  RAO projection, it     showed global hypokinesis with ejection fraction of 40% to 45%.  IMPRESSION: 1. Severe double-vessel coronary artery disease. 2. Mild-to-moderate left ventricular systolic dysfunction.  RECOMMENDATIONS:  We will move the patient upstairs to the main cath lab.  We will perform percutaneous coronary intervention of the native right coronary artery as well as the native mid left anterior descending artery.  The patient will be loaded with 600 mg of Plavix here in the outpatient cath lab.  We will treat him with 20 mg of intravenous hydralazine for his elevated  blood pressure.  We will also give 3500 units of intravenous heparin since the patient has a sheath in place and will be waiting several hours for his intervention.     Lauree Chandler, MD     CM/MEDQ  D:  10/14/2010  T:  10/14/2010  Job:  [id number]  Electronically Signed by Lauree Chandler MD on 10/15/2010 10:01:31 AM

## 2010-10-15 NOTE — Procedures (Signed)
NAME:  Paul Wood, Paul Wood NO.:  [id number]  MEDICAL RECORD NO.:  [id number]           PATIENT TYPE:  O  LOCATION:  [facility]                         FACILITY:  [facility]  PHYSICIAN:  Lauree Chandler, MDDATE OF BIRTH:  March 27, 1932  DATE OF PROCEDURE:  10/14/2010 DATE OF DISCHARGE:                           CARDIAC CATHETERIZATION   PRIMARY CARDIOLOGIST:  Satira Sark, MD  PROCEDURES PERFORMED: 1. Percutaneous transluminal coronary angioplasty with placement of     drug-eluting stent in the mid right coronary artery. 2. Percutaneous transluminal coronary angioplasty with placement of a     drug-eluting stent in the distal left anterior descending artery. 3. Percutaneous transluminal coronary angioplasty with placement of     two overlapping drug-eluting stents in the mid left anterior     descending artery.  OPERATOR:  Lauree Chandler, MD  INDICATIONS:  This is a 77 year old African American male with hypertension, hyperlipidemia, diabetes mellitus who underwent a diagnostic catheterization earlier this morning in the outpatient cath lab.  The patient has recently had dyspnea and had an abnormal Myoview. The diagnostic catheterization revealed two areas of severe disease in the left anterior descending artery and severe disease in the mid right coronary artery.  He was brought to the main cath lab for the intervention.  PROCEDURE IN DETAIL:  The patient arrived in the main cath lab with 5- French sheath present in the right femoral artery and a 6-French sheath present in the right femoral vein.  Both of these sheath were changed out for fresh 6-French sheaths.  The area was prepped and draped in a sterile fashion.  The patient was given additional sedation.  The patient was then given a bolus of Angiomax and drip was started.  He had been loaded with 600 mg of Plavix downstairs in the outpatient cath lab. When the ACT was greater than 200, we used a  6-French JR-4 guiding catheter to selectively engage the native right coronary artery.  A Cougar intracoronary wire was passed down the length of the right coronary artery without difficulty.  A 2.5 x 12 mm balloon was used to predilate the lesion.  A 2.5 x 16 mm Promus element drug-eluting stent was carefully positioned in the midvessel and was deployed without difficulty.  A 2.75 x 12 mm noncompliant balloon was inflated inside the stent.  There was an excellent angiographic result.  The stenosis was taken from 99% down to 0%.  At this point, we turned our attention toward the severely diseased left anterior descending artery.  This vessel had a tight stenosis in the midportion followed by a long tubular segment of disease.  Further away past in an area of healthy vessel and the distal vessel, there was another severe stenosis.  I felt that these areas were far enough of a part that we would not be to place overlapping stents between the mid and distal vessel.  Initially, we took a Cougar wire down the LAD into the distal vessel.  We then used a 2.5 x 12 mm balloon to predilate the mid lesion.  Two balloon inflations were performed in the mid lesion.  We then took the balloon down to the distal lesion and predilated this lesion.  A 2.25 x 16 mm Promus Element drug-eluting stent was deployed in the distal lesion.  A 2.25 x 12 mm noncompliant balloon was carefully positioned inside the stent and was inflated.  The stenosis was taken from 90% down to 0%.  We then attempted to pass a long 28-mm stent into the mid vessel.  However, secondary to tortuosity of the vessel calcification and the disease, we were unable to deliver the stent.  We did place a second Cougar wire in an attempt to gain more support in the vessel.  We were unable to pass the stent.  After excessive fluoro time and multiple attempts, we finally elected to place two shorter stents. A 2.75 x 16 mm Promus element  drug-eluting stent was deployed in the distal portion of the mid vessel in the area of severe disease.  A 3.0 x 12 mm Promus element drug-eluting stent was overlapped in the proximal segment of the mid vessel.  Once again, both of these stents were overlapped.  A 3.25 x 20 mm noncompliant balloon was inflated twice inside the stented segments including an area of overlap.  The stenosis was taken from 90% to 0%.  The patient tolerated the procedure well.  Of note, the patient was agitated on the table secondary to oversedation. There were no immediate complications with the procedure.  IMPRESSION: 1. Successful percutaneous transluminal coronary angioplasty with     placement of a drug-eluting stent in the mid right coronary artery. 2. Successful percutaneous transluminal coronary angioplasty with     placement of two overlapping drug-eluting stents in the mid left     anterior descending artery and one stent in the distal left     anterior descending artery.  RECOMMENDATIONS:  The patient will be continued on beta-blocker and a statin.  We will continue his aspirin and Plavix for at least 1 year. He will be watched closely tonight and discharged home tomorrow if he is stable.     Lauree Chandler, MD     CM/MEDQ  D:  10/14/2010  T:  10/15/2010  Job:  [id number]  cc:   Satira Sark, MD  Electronically Signed by Lauree Chandler MD on 10/15/2010 10:01:37 AM

## 2010-10-17 LAB — URINALYSIS, ROUTINE W REFLEX MICROSCOPIC
Bilirubin Urine: NEGATIVE
Nitrite: NEGATIVE
Specific Gravity, Urine: 1.02 (ref 1.005–1.030)
pH: 5.5 (ref 5.0–8.0)

## 2010-10-17 LAB — POCT I-STAT, CHEM 8
Chloride: 107 mEq/L (ref 96–112)
Glucose, Bld: 124 mg/dL — ABNORMAL HIGH (ref 70–99)
HCT: 42 % (ref 39.0–52.0)
Potassium: 4.8 mEq/L (ref 3.5–5.1)
Sodium: 140 mEq/L (ref 135–145)

## 2010-10-18 ENCOUNTER — Encounter: Payer: Self-pay | Admitting: Cardiovascular Disease

## 2010-10-23 NOTE — Discharge Summary (Signed)
Paul Wood, Paul Wood                ACCOUNT NO.:  [id number]  MEDICAL RECORD NO.:  [id number]           PATIENT TYPE:  O  LOCATION:  [id number]                         FACILITY:  [facility]  PHYSICIAN:  Lauree Chandler, MDDATE OF BIRTH:  [birth year]  DATE OF ADMISSION:  10/14/2010 DATE OF DISCHARGE:  10/15/2010                              DISCHARGE SUMMARY   PRIMARY CARDIOLOGIST:  Satira Sark, MD  PRIMARY CARE PROVIDER:  Dr. Penni Bombard.  DISCHARGE DIAGNOSIS:  Unstable angina.  SECONDARY DIAGNOSES: 1. Coronary artery disease, status post percutaneous coronary     intervention and drug-eluting stent placement to the mid and distal     left anterior descending along with the right coronary artery this     admission. 2. Hypertension. 3. Hyperlipidemia. 4. Type 2 diabetes mellitus. 5. Acute renal insufficiency. 6. History of cerebrovascular accident in 2008. 7. Peptic ulcer disease. 8. Gout. 9. Hypothyroidism. 10.Depression. 11.Status post multiple tooth extractions.  ALLERGIES:  No known drug allergies.  PROCEDURES: 1. Left heart cardiac catheterization on October 14, 2010, left main     normal, left anterior descending 90% mid, left circumflex 60% mid,     right coronary artery 99% mid, ejection fraction 40-45% with global     hypokinesis. 2. Successful percutaneous coronary intervention and stenting of the     right coronary artery with placement of a 2.5 x 16 mm Promus     Element Plus drug-eluting stent.  Percutaneous coronary     intervention of the distal left anterior descending with placement     of 2.25 x 16 mm Promus Element Plus drug-eluting stent.     Percutaneous coronary intervention of the mid left anterior     descending with placement of 2.75 x 16 mm and also a 3.0 x 12 mm     Promus Element Plus drug-eluting stent.  HISTORY OF PRESENT ILLNESS:  This is a 77 year old male who was recently seen in clinic by Dr. Domenic Polite on October 04, 2010, with  complaints of ongoing class 3 dyspnea on exertion.  The patient had previously had a nonischemic Myoview in October 2011 and was being treated for nonischemic cardiomyopathy.  Because of persistent progression of symptoms, decision was made to perform cardiac catheterization.  HOSPITAL COURSE:  The patient presented to [facility] on October 14, 2010, for diagnostic catheterization.  Left heart cath revealed a 90% mid stenosis in the LAD as well as a 99% mid RCA stenosis.  Otherwise, he had nonobstructive disease with an EF of 40-45% and global hypokinesis.  Right heart catheterization revealed an elevated pulmonary arterial pressure 41/11 with a mean of 24 and a wedge of 10.  His cardiac output index was preserved at 5.5 liters per minute and 2.2 liters per minute per meter squared respectively.  Decision was made to pursue PCI of his RCA and LAD.  The RCA was successfully stented with a 2.75 x 16 mm Promus Element Plus drug-eluting stent.  The distal LAD received a 2.25 x 16 mm Promus Plus drug-eluting stent while the mid LAD was treated with 2 additional Promus Element Plus  drug-eluting stents as outlined above.  The patient has been maintained on aspirin, Plavix, beta-blocker, and statin therapy and was hypertensive on the day of his catheterization as well as this morning requiring titration of his beta-blocker.  The patient's creatinine had bumped from 1.32 on October 07, 2010, to 1.7 following the cath and PCI.  As such, we are holding his ACE inhibitor and plan to follow up with basic metabolic panel in 3 days.  He is follow up with Dr. Domenic Polite in approximately 2 weeks.  DISCHARGE LABORATORY DATA:  Hemoglobin 11.1, hematocrit 33.8, WBC 7.8, and platelets 218.  Sodium 140, potassium 4.1, chloride 107, CO2 of 25, BUN 25, creatinine 1.69, glucose 135, and calcium 8.6.  DISPOSITION:  The patient will be discharged home today in good condition.  FOLLOWUP PLANS AND  APPOINTMENTS:  The patient will have a basic metabolic panel checked at the [facility] in [place] on October 18, 2010. He will follow up with Dr. Domenic Polite on November 01, 2010, at 8:40 a.m.  He will follow up with Dr. Encarnacion Slates as previously scheduled.  DISCHARGE MEDICATIONS: 1. Amlodipine 10 mg daily. 2. Plavix 75 mg daily. 3. Lipitor 80 mg nightly. 4. Nitroglycerin 0.4 mg sublingual p.r.n. chest pain. 5. Carvedilol 12.5 mg b.i.d. 6. Allopurinol 300 mg daily. 7. Aspirin 81 mg daily. 8. Citalopram 20 mg daily. 9. Colchicine 0.6 mg daily. 10.Lantus 15 units nightly. 11.Levothyroxine 88 mcg daily. 12.Metformin 1000 mg b.i.d. to be resumed on October 17, 2010. 13.Tamsulosin 0.4 mg daily. 14.Lisinopril 20 mg daily is currently on hold.  OUTSTANDING LABORATORY DATA AND STUDIES:  Followup basic metabolic panel on October 18, 2010.  DURATION OF DISCHARGE ENCOUNTER:  45 minutes including physician time.     Murray Hodgkins, ANP   ______________________________ Lauree Chandler, MD    CB/MEDQ  D:  10/15/2010  T:  10/15/2010  Job:  [id number]  cc:   Dr. Penni Bombard  Electronically Signed by Murray Hodgkins ANP on 10/21/2010 03:04:23 PM Electronically Signed by Lauree Chandler MD on 10/23/2010 01:58:20 PM

## 2010-10-30 ENCOUNTER — Telehealth: Payer: Self-pay | Admitting: *Deleted

## 2010-10-30 NOTE — Telephone Encounter (Signed)
Pt notified of lab results and verbalized understanding.

## 2010-10-30 NOTE — Telephone Encounter (Signed)
Message copied by Gurney Maxin on Wed Oct 30, 2010  4:53 PM ------      Message from: MCDOWELL, Mikeal Hawthorne      Created: Fri Oct 25, 2010  4:08 PM       Labs were forwarded to me following patient's discharge from the hospital. His creatinine was 1.6 at discharge, now down to 1.3.

## 2010-10-31 ENCOUNTER — Encounter: Payer: Self-pay | Admitting: Cardiology

## 2010-11-01 ENCOUNTER — Encounter: Payer: Self-pay | Admitting: Cardiology

## 2010-11-01 ENCOUNTER — Ambulatory Visit (INDEPENDENT_AMBULATORY_CARE_PROVIDER_SITE_OTHER): Payer: MEDICARE | Admitting: Cardiology

## 2010-11-01 VITALS — BP 148/78 | HR 56 | Ht 74.0 in | Wt 271.0 lb

## 2010-11-01 DIAGNOSIS — I10 Essential (primary) hypertension: Secondary | ICD-10-CM

## 2010-11-01 DIAGNOSIS — E785 Hyperlipidemia, unspecified: Secondary | ICD-10-CM

## 2010-11-01 DIAGNOSIS — N189 Chronic kidney disease, unspecified: Secondary | ICD-10-CM | POA: Insufficient documentation

## 2010-11-01 DIAGNOSIS — R0989 Other specified symptoms and signs involving the circulatory and respiratory systems: Secondary | ICD-10-CM

## 2010-11-01 DIAGNOSIS — I2510 Atherosclerotic heart disease of native coronary artery without angina pectoris: Secondary | ICD-10-CM | POA: Insufficient documentation

## 2010-11-01 DIAGNOSIS — IMO0001 Reserved for inherently not codable concepts without codable children: Secondary | ICD-10-CM

## 2010-11-01 DIAGNOSIS — N289 Disorder of kidney and ureter, unspecified: Secondary | ICD-10-CM

## 2010-11-01 HISTORY — DX: Chronic kidney disease, unspecified: N18.9

## 2010-11-01 MED ORDER — PRAVASTATIN SODIUM 20 MG PO TABS: 20.0000 mg | ORAL_TABLET | Freq: Every evening | ORAL | Status: DC

## 2010-11-01 MED ORDER — LISINOPRIL 20 MG PO TABS: 20.0000 mg | ORAL_TABLET | Freq: Every day | ORAL | Status: DC

## 2010-11-01 NOTE — Assessment & Plan Note (Signed)
Continue followup with Dr. Pleas Koch.

## 2010-11-01 NOTE — Assessment & Plan Note (Signed)
Blood pressure is up. Plan to resume lisinopril at prior dose of 20 mg daily, followup [facility]ET for his next office visit.

## 2010-11-01 NOTE — Assessment & Plan Note (Signed)
Documented above, now status post coronary revascularization with drug-eluting stents. Symptomatically he has improved. I discussed the critical importance of uninterrupted dual anti-platelet therapy, at least for a year. I encouraged him to continue walking. Followup arranged.

## 2010-11-01 NOTE — Assessment & Plan Note (Signed)
This has improved following coronary revascularization with recent stent placement.

## 2010-11-01 NOTE — Assessment & Plan Note (Signed)
Creatinine improved by followup lab work as noted above. Plan to resume ACE inhibitor for renal protective effects particularly in light of diabetes, and the fact that he tolerated this previously, and needs additional blood pressure control.

## 2010-11-01 NOTE — Progress Notes (Signed)
Clinical Summary [name] is a 77 y.o.male presenting for followup. He was seen in late March for further evaluation of progressive exertional shortness of breath. Prior nonischemic testing suggested the possibility of a nonischemic cardiomyopathy with a fixed inferobasal defect, and TID ratio of 1.17, however followup echocardiography demonstrated LVEF of 55-60%. He was ultimately referred for a diagnostic cardiac catheterization, noted below. He ultimately underwent placement of a DES to the mid RCA, DES in the distal LAD, and overlapping DES x2 in the mid LAD by Dr. Angelena Form. Creatinine did increase to 1.7 following the procedure.  Followup labs from April 13 showed BUN 26, creatinine 1.3, potassium 4.6, sodium 141.  He states that he is feeling better, shortness of breath has improved. He is going back to walking regularly, plans to start preaching again. He is not reporting any chest pain, has no reported bleeding problems at this point on dual antiplatelet therapy. We discussed the importance of continuing aspirin and Plavix uninterrupted, at least for a year. We also reviewed his medications. He was taken off of ACE inhibitor, Norvasc, also Pravachol it seems. Blood pressure has increased.   No Known Allergies  Current outpatient prescriptions:allopurinol (ZYLOPRIM) 300 MG tablet, Take 300 mg by mouth daily.  , Disp: , Rfl: ;  aspirin 81 MG tablet, Take 81 mg by mouth daily.  , Disp: , Rfl: ;  carvedilol (COREG) 12.5 MG tablet, Take 12.5 mg by mouth 2 (two) times daily with a meal.  , Disp: , Rfl: ;  citalopram (CELEXA) 20 MG tablet, Take 20 mg by mouth daily.  , Disp: , Rfl: ;  clopidogrel (PLAVIX) 75 MG tablet, Take 75 mg by mouth daily.  , Disp: , Rfl:  colchicine 0.6 MG tablet, Take one by mouth twice daily  , Disp: , Rfl: ;  insulin glargine (LANTUS SOLOSTAR) 100 UNIT/ML injection, Inject into the skin at bedtime.  , Disp: , Rfl: ;  levothyroxine (SYNTHROID, LEVOTHROID) 88 MCG tablet, Take 88  mcg by mouth daily.  , Disp: , Rfl: ;  metFORMIN (GLUCOPHAGE) 1000 MG tablet, Take 1,000 mg by mouth 2 (two) times daily with a meal.  , Disp: , Rfl:  Multiple Vitamin (MULTIVITAMIN) tablet, Take 1 tablet by mouth daily.  , Disp: , Rfl: ;  Tamsulosin HCl (FLOMAX) 0.4 MG CAPS, Take one by mouth daily  , Disp: , Rfl: ;  DISCONTD: amLODipine (NORVASC) 10 MG tablet, Take 10 mg by mouth daily.  , Disp: , Rfl: ;  DISCONTD: carvedilol (COREG) 3.125 MG tablet, Take 3.125 mg by mouth 2 (two) times daily with a meal.  , Disp: , Rfl:  DISCONTD: levothyroxine (SYNTHROID, LEVOTHROID) 50 MCG tablet, Take 50 mcg by mouth daily.  , Disp: , Rfl: ;  DISCONTD: lisinopril (PRINIVIL,ZESTRIL) 20 MG tablet, Take 20 mg by mouth daily.  , Disp: , Rfl: ;  DISCONTD: pravastatin (PRAVACHOL) 20 MG tablet, Take 20 mg by mouth daily.  , Disp: , Rfl:   Past Medical History  Diagnosis Date  . Hypothyroidism   . Mixed hyperlipidemia   . Ventral hernia   . Hearing loss   . Proteinuria     Mild  . Gout   . Hypertrophy of prostate without urinary obstruction and other lower urinary tract symptoms (LUTS)   . PUD (peptic ulcer disease)   . Stroke     2008  . Depression   . Essential hypertension, benign   . Type 2 diabetes mellitus   . Coronary  atherosclerosis of native coronary artery     4/12 - DES mid RCA, DES distal LAD, DES x2 mid LAD    Social History Paul Wood reports that he has never smoked. He has never used smokeless tobacco. Paul Wood reports that he does not drink alcohol.  Review of Systems Shortness of breath improved, no chest pain. No bleeding problems. Reports compliance with medications. Otherwise reviewed and negative.  Physical Examination Filed Vitals:   11/01/10 0857  BP: 148/78  Pulse: 56   GEN: Morbidly obese, no distress  HEENT: NCAT,PERRLA,EOMI  NECK: palpable pulses, no bruits; unable to assess JVD, secondary to neck girth  LUNGS: CTA bilaterally  HEART: RRR (S1S2); no significant  murmurs; premature beats  ABD: soft, NT; intact BS  EXT: nonpalpable peripheral pulses; no significant edema. MUSC: no obvious deformity  NEURO: A/O (x3)   Studies Cardiac catheterization 10/14/2010: ANGIOGRAPHIC FINDINGS: 1. The left main artery was free of disease. 2. The left anterior descending was a large vessel that coursed to the     apex and gave off several diagonal branches.  The midportion of the     vessel appeared to have 90% stenosis. 3. The circumflex artery had moderate plaque throughout with a 60%     stenosis in the distal portion of an obtuse marginal branch. 4. The right coronary artery is a large dominant vessel with a 99% mid     stenosis. 5. Left ventricular angiogram was performed in the RAO projection, it     showed global hypokinesis with ejection fraction of 40% to 45%.  Central aortic pressure 199/80, left ventricular pressure 197/21/32, right atrial pressure 4, right ventricular pressure 36/104, pulmonary artery pressure 41/11 with a mean of 24, pulmonary capillary wedge pressure mean of 10, central aortic saturation 96%, pulmonary artery saturation 61%, cardiac output 5.5 L per minute, cardiac index 2.2 L per minute per meter squared.  Problem List and Plan

## 2010-11-01 NOTE — Assessment & Plan Note (Signed)
Resume Pravachol at prior dose. Will followup lipid profile down the road.

## 2010-11-01 NOTE — Patient Instructions (Addendum)
   Follow up in 1 month as scheduled. Your physician recommends that you go to the [facility] for lab work: [employer]. DO LABWORK A FEW DAYS BEFORE FOLLOW UP APPT IN MAY. Restart Lisinopril 20mg  daily. Restart Pravachol 20mg  daily.

## 2010-11-19 NOTE — H&P (Signed)
Paul Wood, Paul Wood                ACCOUNT NO.:  [id number]   MEDICAL RECORD NO.:  [id number]          PATIENT TYPE:  INP   LOCATION:  [id number]                          FACILITY:  [facility]   PHYSICIAN:  Bonnielee Haff, MD     DATE OF BIRTH:  1931-08-22   DATE OF ADMISSION:  12/02/2008  DATE OF DISCHARGE:  LH                              HISTORY & PHYSICAL   PRIMARY CARE PHYSICIAN:  Dr. Weston Settle.   ADMISSION DIAGNOSIS:  1. Acute gout versus osteomyelitis of the right first      metatarsophalangeal joint.  2. History of gout.  3. History of insulin dependent diabetes.  4. History of hypertension.  5. History of stroke.   CHIEF COMPLAINT:  Pain in the right foot.   HISTORY OF PRESENT ILLNESS:  The patient is a 77 year old African  American male who otherwise despite his medical problems is in a usually  good state of health who about two weeks ago started experiencing pain  in the right foot, specifically in the right first toe.  This pain  gradually worsened.  He went to his doctor and was given an injection of  some sort and was prescribed some other medications that the patient is  not able to tell me.  He denied any fever or chills.  He had one episode  of nausea and vomiting in the last two weeks.  Pain was 5/5 in  intensity, throbbing pain, aching type, worsening in the night,  worsening with sleep, worsening with some kind of activity.  It also  became warm to touch and became red and his foot started swelling as  well.  These symptoms were not improving; so, he decided to come into  the hospital today.  He says he usually experiences gout attacks quite  frequently.  He is unable to tell me how frequently.   MEDICATIONS AT HOME:  Unfortunately, he does not remember his home  medications.  Family is going to bring in a list later tonight.   ALLERGIES:  No known drug allergies.   PAST MEDICAL HISTORY:  Gout for 10 years, stroke in 2009 for which he  was admitted at  [facility], diabetes for the past 15 years, hypertension.  No history of coronary artery disease or any heart disease.  No surgical  history.   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  He used to drink quite heavily but quit 15 years ago.  No smoking use currently.  No illicit drug use.  Independent with his  daily activities.   FAMILY HISTORY:  Unremarkable per the patient.   REVIEW OF SYSTEMS:  GENERAL:  Positive for weakness.  HEENT:  Unremarkable.  CARDIOVASCULAR:  Unremarkable.  RESPIRATORY:  Unremarkable.  GI:  Unremarkable.  GU:  Unremarkable.  NEUROLOGICAL:  Unremarkable.  PSYCHIATRIC:  Unremarkable.  DERMATOLOGIC:  Unremarkable.  MUSCULOSKELETAL:  As in HPI.  Other systems unremarkable.   PHYSICAL EXAMINATION:  When he was evaluated in the ED, he was found to  have a temperature of 98.3, blood pressure  was 147/78, heart rate 75,  respiratory rate 20, saturation 100% on room air.  Blood pressure here  at 4:15 was 168/101.  GENERAL:  This is an obese, elderly African American male, pleasant to  talk to, in no distress.  HEENT:  There is no pallor and no icterus.  Oral mucosal membrane is  moist.  No oral lesions are noted.  NECK:  Soft and supple.  No thyromegaly is appreciated.  LUNGS:  Clear to auscultation bilaterally.  No wheezing, rales or  rhonchi.  CARDIOVASCULAR:  S1, S2 normal, regular.  No S3, S4, no rubs, no bruits.  No pedal edema.  Peripheral pulses are palpable.  ABDOMEN:  Soft, obese, nontender, nondistended.  Bowel sounds are  present.  No masses or organomegaly is appreciated.  GU:  Examination deferred.  NEUROLOGICAL:  He is alert and oriented times three.  No focal  neurological deficits are present.  Examination of the right foot reveals swelling.  Slight erythema is  noted.  No definite warmth is present.  Range of motion is within normal  limits but the swelling is definitely more pronounced near  the first toe  area.  DERMATOLOGICAL:  Apart from the above, no other rashes are noted.   LABORATORY DATA:  His white count is normal at 6.6.  Hemoglobin is 12.8,  MCV 85, platelet count 195.  ESR is 31.  Glucose 241.  The rest of the  electrolytes are normal.  Blood cultures are pending.  X-ray of the  right foot reveals soft tissue swelling about the first MTP joint and  sclerosis of the distal metatarsal raising a question of osteomyelitis.  No radiographic evidence for gout was noted.   ASSESSMENT:  This is a 77 year old African American male with medical  problems as stated earlier who presents with pain in his right foot.  This appears to be acute gouty inflammation.  Even through x-ray looks  suspicious for osteomyelitis, I do not believe that is what is going on  here.  His other medical issues appear to be stable.  His diabetes  appears to be poorly controlled as is his hypertension.   PLAN:  1. Right foot swelling most likely gout.  We will check a uric acid      level, put him on colchicine and since his renal function is okay,      we will put him on some Indocin.  The patient tells me that he is      on prednisone at home but he is not sure.  So, I will wait for his      medication list to come by.  I have discussed this case with Dr.      Luna Glasgow who will evaluate the patient in the morning.  I do not      want to subject the patient to an MRI because he has severe      claustrophobia until he is seen by Dr. Luna Glasgow.  For now, I will      continue with antibiotics but I suspect this can be discontinued if      the uric acid level comes out high.  Also we will get a PT      evaluation.  Pain control will be provided.  Since he is getting      frequent episodes of gout, he probably is a candidate for      allopurinol if he is not on it already.  2. Diabetes, insulin dependent,  poorly controlled.  Hemoglobin A1c      will be checked.  CBGs will be checked.  Sliding scale  will be      provided and we will await his dose of Lantus and other medications      that he may be on.  3. Hypertension.  Will await his medication list.  We will recheck his      blood pressure and bring his blood pressure down quickly.  4. DVT prophylaxis will be provided.  5. He is a full code.   Further management decisions will depend on results of further testing  and the patient's response to treatment.      Bonnielee Haff, MD  Electronically Signed     GK/MEDQ  D:  12/02/2008  T:  12/02/2008  Job:  [id number]   cc:   Weston Settle, M.D.   Iona Hansen, M.D.  Fax: [phone]

## 2010-11-19 NOTE — Consult Note (Signed)
Paul Wood, Paul Wood                ACCOUNT NO.:  [id number]   MEDICAL RECORD NO.:  [id number]          PATIENT TYPE:  INP   LOCATION:  A312                          FACILITY:  [facility]   PHYSICIAN:  J. Sanjuana Kava, M.D. DATE OF BIRTH:  08/24/1931   DATE OF CONSULTATION:  12/03/2008  DATE OF DISCHARGE:                                 CONSULTATION   The patient was seen at the request of [facility].   The patient is a 77 year old male with pain and tenderness in his right  great toe.  It began slowly over the last 10 days to 2 weeks.  It has  been painful.  It has been sore.  Denies any trauma.  X-rays were done,  which showed changes to the great toe, consider possible early  osteomyelitis.  The patient's uric acid was drawn and was 4.2.  His sed  rate was drawn, it is 31.  Rest of the labs are negative.  He was placed  on antibiotics.  He was concerned about gout.   The patient gives a history of gout over the last 20-30 years.  His  father had gout ruled out, his grandfather had gout ruled out, and he  understands that other relatives had it and his family members, brother  did have it.   The patient denies again any direct trauma to the foot.  He has had pain  and tenderness.  He has had pain, just minimal trauma below and over his  foot.  I have talked to Dr. Maryland Pink yesterday about this.  I asked the  uric acid to be drawn.  Again it is 4.2, sed rate is normal.  I reviewed  the x-rays and it is really nonconclusive.  I do not think he has  osteomyelitis.  It is not consistent with it.  It is more persistent  with mild degenerative joint disease of the great toe area.  There are  no bone erosions and there is no cutout consistent with gout either.  The patient's foot is swollen.  It is tender.  It is not warm.  He has  been on colchicine, used yesterday per my request with Dr. Maryland Pink.  He  is also placed on Indocin.  The patient's kidney functions are normal,  motion of the great toe is tender, but you can touch the foot, yesterday  we could barely touch it.  There is diffuse swelling over the great toe.  It is not red.  Other extremities are negative.   IMPRESSION:  Acute gout, chronic great gout.   I do not think this is an osteomyelitis infection.  I think, his  antibiotic can be stopped.  He responded well to the colchicine.  His  sed rate is 31.  If this was an infection, his sed rate would be much  higher.  There is really no fluid there to aspirate and if we did so, it  would be very painful.  Uric acid level can be normal and this did have  gout.  He has a long history of gout.  He has got a familial history of  gout.  He has been on allopurinol in the past, but stopped taking it and  as most people do, they stop taking it after a while and they will get  an another attack.   I spent some time talking to the patient and his wife about gout, about  the hereditary nature, and the need to be on allopurinol on a regular  basis.  We will start him on 100 mg a day for a week and then 200 mg a  day for a week and  then 300 mg a day thereafter.  I can see him in the office in 3 weeks.  I have written a prescription and placed it on the chart.  He has a son  also that has gout.   I will see him in the office as stated.  Continue the medication.           ______________________________  Lenna Sciara. Sanjuana Kava, M.D.     JWK/MEDQ  D:  12/03/2008  T:  12/04/2008  Job:  [id number]

## 2010-11-28 ENCOUNTER — Encounter: Payer: Self-pay | Admitting: Cardiology

## 2010-12-03 ENCOUNTER — Ambulatory Visit: Payer: MEDICARE | Admitting: Cardiology

## 2011-01-10 ENCOUNTER — Ambulatory Visit (INDEPENDENT_AMBULATORY_CARE_PROVIDER_SITE_OTHER): Payer: Medicare Other | Admitting: Cardiology

## 2011-01-10 ENCOUNTER — Encounter: Payer: Self-pay | Admitting: Cardiology

## 2011-01-10 DIAGNOSIS — I2510 Atherosclerotic heart disease of native coronary artery without angina pectoris: Secondary | ICD-10-CM

## 2011-01-10 DIAGNOSIS — E782 Mixed hyperlipidemia: Secondary | ICD-10-CM

## 2011-01-10 DIAGNOSIS — N189 Chronic kidney disease, unspecified: Secondary | ICD-10-CM

## 2011-01-10 DIAGNOSIS — Z79899 Other long term (current) drug therapy: Secondary | ICD-10-CM

## 2011-01-10 MED ORDER — PRAVASTATIN SODIUM 20 MG PO TABS: 20.0000 mg | ORAL_TABLET | Freq: Every evening | ORAL | Status: DC

## 2011-01-10 NOTE — Patient Instructions (Signed)
Your physician wants you to follow-up in: 3 months. You will receive a reminder letter in the mail one-two months in advance. If you don't receive a letter, please call our office to schedule the follow-up appointment. Stop Lipitor. Resume Pravachol 20 mg every night. Your physician recommends that you go to the [facility] for a FASTING lipid profile and liver function labs. Do not eat or drink after midnight. DO IN 4 WEEKS.

## 2011-01-10 NOTE — Assessment & Plan Note (Signed)
Followup creatinine stable, tolerating ACE inhibitor therapy.

## 2011-01-10 NOTE — Assessment & Plan Note (Signed)
Stable symptomatically on medical therapy. No changes made today.

## 2011-01-10 NOTE — Progress Notes (Signed)
Please clarify which cholesterol med he needs to be taking.  Has Pravastatin & Atorvastatin.

## 2011-01-10 NOTE — Progress Notes (Signed)
Clinical Summary Mr. Baray is a 77 y.o.male presenting for followup. He was seen in April following coronary intervention, noted below. He is here with his wife.  Followup lab work from May 25 showed BUN 20, creatinine 1.1, sodium 144, potassium 4.5. He has tolerated higher dose ACE inhibitor therapy. Back on Pravachol for approximately one month.  Reports no angina, stable dyspnea on exertion. Has more stamina in general. Reports compliance with dual antiplatelet therapy. Denies any unusual bleeding problems.   No Known Allergies  Current outpatient prescriptions:allopurinol (ZYLOPRIM) 300 MG tablet, Take 300 mg by mouth daily.  , Disp: , Rfl: ;  aspirin 81 MG tablet, Take 81 mg by mouth daily.  , Disp: , Rfl: ;  carvedilol (COREG) 12.5 MG tablet, Take 12.5 mg by mouth 2 (two) times daily with a meal.  , Disp: , Rfl: ;  citalopram (CELEXA) 20 MG tablet, Take 20 mg by mouth daily.  , Disp: , Rfl: ;  clopidogrel (PLAVIX) 75 MG tablet, Take 75 mg by mouth daily.  , Disp: , Rfl:  colchicine 0.6 MG tablet, Take one by mouth twice daily  , Disp: , Rfl: ;  insulin glargine (LANTUS SOLOSTAR) 100 UNIT/ML injection, Inject 12 Units into the skin at bedtime. , Disp: , Rfl: ;  levothyroxine (SYNTHROID, LEVOTHROID) 75 MCG tablet, Take 75 mcg by mouth daily.  , Disp: , Rfl: ;  lisinopril (PRINIVIL,ZESTRIL) 20 MG tablet, Take 1 tablet (20 mg total) by mouth daily., Disp: 30 tablet, Rfl: 6 metFORMIN (GLUCOPHAGE) 1000 MG tablet, Take 1,000 mg by mouth 2 (two) times daily with a meal.  , Disp: , Rfl: ;  Multiple Vitamin (MULTIVITAMIN) tablet, Take 1 tablet by mouth daily.  , Disp: , Rfl: ;  Tamsulosin HCl (FLOMAX) 0.4 MG CAPS, Take one by mouth daily  , Disp: , Rfl: ;  DISCONTD: atorvastatin (LIPITOR) 80 MG tablet, Take 80 mg by mouth daily.  , Disp: , Rfl:  DISCONTD: levothyroxine (SYNTHROID, LEVOTHROID) 88 MCG tablet, Take 88 mcg by mouth daily.  , Disp: , Rfl: ;  pravastatin (PRAVACHOL) 20 MG tablet, Take 1 tablet  (20 mg total) by mouth every evening., Disp: 30 tablet, Rfl: 6;  DISCONTD: pravastatin (PRAVACHOL) 20 MG tablet, Take 1 tablet (20 mg total) by mouth every evening., Disp: 30 tablet, Rfl: 6  Past Medical History  Diagnosis Date  . Hypothyroidism   . Mixed hyperlipidemia   . Ventral hernia   . Hearing loss   . Proteinuria     Mild  . Gout   . Hypertrophy of prostate without urinary obstruction and other lower urinary tract symptoms (LUTS)   . PUD (peptic ulcer disease)   . Stroke     2008  . Depression   . Essential hypertension, benign   . Type 2 diabetes mellitus   . Coronary atherosclerosis of native coronary artery     4/12 - DES mid RCA, DES distal LAD, DES x2 mid LAD    Past Surgical History  Procedure Date  . Multiple tooth extractions     HX DENTAL CARIES/ALMOST ALL REMOVED    Family History  Problem Relation Age of Onset  . Gout Father     DIED AGE 53-HOUSE FIRE  . Alzheimer's disease Mother     DIED AGE 87/uncertain hx  . Other Brother     2 brothers deceased:1-gun shot wound & 1-hypothermia(froze to death)  . Colon cancer Sister     71 sisters:1-colon cancer age 22's  .  Gout Son     age 42  . Hypertension Son     age 88  . Hyperlipidemia Son     age 40  . Other Daughter     age 60:leg problems,Knee OA    Social History Mr. Fouty reports that he has never smoked. He has never used smokeless tobacco. Mr. Litterio reports that he does not drink alcohol.  Review of Systems Uses cane to ambulate. No falls. Otherwise negative.  Physical Examination Filed Vitals:   01/10/11 1421  BP: 149/76  Pulse: 60   GEN: Morbidly obese, no distress  HEENT: NCAT,PERRLA,EOMI  NECK: palpable pulses, no bruits; unable to assess JVD, secondary to neck girth  LUNGS: CTA bilaterally  HEART: RRR (S1S2); no significant murmurs; premature beats  ABD: soft, NT; intact BS  EXT: nonpalpable peripheral pulses; no significant edema.  MUSC: no obvious deformity  NEURO: A/O  (x3)    Problem List and Plan

## 2011-01-10 NOTE — Assessment & Plan Note (Signed)
Plan followup fasting lipid profile and liver function tests over the next 4 weeks on Pravachol.

## 2011-02-13 ENCOUNTER — Telehealth: Payer: Self-pay | Admitting: *Deleted

## 2011-02-13 DIAGNOSIS — E785 Hyperlipidemia, unspecified: Secondary | ICD-10-CM

## 2011-02-13 DIAGNOSIS — Z79899 Other long term (current) drug therapy: Secondary | ICD-10-CM

## 2011-02-13 MED ORDER — PRAVASTATIN SODIUM 40 MG PO TABS: 40.0000 mg | ORAL_TABLET | Freq: Every evening | ORAL | Status: DC

## 2011-02-13 NOTE — Telephone Encounter (Signed)
Patient informed of the below. New prescription sent to [facility] for pravastatin 40mg . Patient advised to take 2 of his 20mg  until they are finished. Lab order faxed to [facility] for FLP/LFT. Patient verbalized understanding of plan.

## 2011-02-13 NOTE — Telephone Encounter (Signed)
Message copied by [name] on Thu Feb 13, 2011  9:40 AM ------      Message from: Donney Dice      Created: Mon Feb 10, 2011  4:24 PM       LDL 84 ...increase Pravachol to 40 mg qhs. Repeat FLP/LFTs in 12 weeks

## 2011-05-21 ENCOUNTER — Encounter: Payer: Self-pay | Admitting: Cardiology

## 2011-05-27 ENCOUNTER — Ambulatory Visit (INDEPENDENT_AMBULATORY_CARE_PROVIDER_SITE_OTHER): Payer: Medicare Other | Admitting: Cardiology

## 2011-05-27 ENCOUNTER — Encounter: Payer: Self-pay | Admitting: Cardiology

## 2011-05-27 VITALS — BP 180/84 | HR 60 | Ht 74.0 in | Wt 267.0 lb

## 2011-05-27 DIAGNOSIS — I2510 Atherosclerotic heart disease of native coronary artery without angina pectoris: Secondary | ICD-10-CM

## 2011-05-27 DIAGNOSIS — E785 Hyperlipidemia, unspecified: Secondary | ICD-10-CM

## 2011-05-27 DIAGNOSIS — E669 Obesity, unspecified: Secondary | ICD-10-CM

## 2011-05-27 DIAGNOSIS — I10 Essential (primary) hypertension: Secondary | ICD-10-CM

## 2011-05-27 NOTE — Assessment & Plan Note (Signed)
Blood pressure elevated today, although patient had not yet taken his medications. Discussed with Paul Wood and his wife.

## 2011-05-27 NOTE — Assessment & Plan Note (Signed)
LDL control is reasonable on current dose of Pravachol. No changes made.

## 2011-05-27 NOTE — Patient Instructions (Signed)
Your physician wants you to follow-up in: 6 months. You will receive a reminder letter in the mail one-two months in advance. If you don't receive a letter, please call our office to schedule the follow-up appointment. Your physician recommends that you continue on your current medications as directed. Please refer to the Current Medication list given to you today. 

## 2011-05-27 NOTE — Assessment & Plan Note (Signed)
Weight loss indicated. We have discussed diet and exercise.

## 2011-05-27 NOTE — Progress Notes (Signed)
Clinical Summary [name] is a 77 y.o.male presenting for followup. He was seen in July. He is here with his wife. Reports no angina or progressive shortness of breath. States that he is able to ambulate stairs at his home, also has done some wood splitting outside.  Recent followup lab work showed AST 36, ALT 58, cholesterol 145, triglycerides 36, HDL 56, LDL 82. We reviewed these today.  He reports compliance with medications, although did not bring them in for review, has not yet taken them today. We discussed diet, sodium restriction, weight loss, and exercise.  Followup ECG is reviewed.  No Known Allergies  Medication list reviewed.  Past Medical History  Diagnosis Date  . Hypothyroidism   . Mixed hyperlipidemia   . Ventral hernia   . Hearing loss   . Proteinuria     Mild  . Gout   . Hypertrophy of prostate without urinary obstruction and other lower urinary tract symptoms (LUTS)   . PUD (peptic ulcer disease)   . Stroke     2008  . Depression   . Essential hypertension, benign   . Type 2 diabetes mellitus   . Coronary atherosclerosis of native coronary artery     4/12 - DES mid RCA, DES distal LAD, DES x2 mid LAD    Past Surgical History  Procedure Date  . Multiple tooth extractions     HX DENTAL CARIES/ALMOST ALL REMOVED    Family History  Problem Relation Age of Onset  . Gout Father     DIED AGE 46-HOUSE FIRE  . Alzheimer's disease Mother     DIED AGE 68/uncertain hx  . Other Brother     2 brothers deceased:1-gun shot wound & 1-hypothermia(froze to death)  . Colon cancer Sister     71 sisters:1-colon cancer age 37's  . Gout Son     age 19  . Hypertension Son     age 3  . Hyperlipidemia Son     age 65  . Other Daughter     age 16:leg problems,Knee OA    Social History [name] reports that he has never smoked. He has never used smokeless tobacco. [name] reports that he does not drink alcohol.  Review of Systems No palpitations or syncope. No  orthopnea or PND. No reported bleeding problems. Otherwise negative.  Physical Examination Filed Vitals:   05/27/11 1306  BP: 180/84  Pulse: 75   Morbidly obese male in no acute distress. HEENT: Conjunctiva and lids normal, oropharynx clear with moist mucosa. Neck: Supple, increased girth, no elevated JVP or carotid bruits. Lungs: Clear to auscultation, nonlabored breathing at rest. Cardiac: Regular rate and rhythm with ectopy, no S3 or significant systolic murmur, no pericardial rub. Abdomen: Obese, nontender, bowel sounds present. Extremities: No pitting edema, distal pulses 2+. Skin: Warm and dry. Musculoskeletal: No kyphosis. Neuropsychiatric: Alert and oriented x3, affect grossly appropriate.  ECG Sinus bradycardia with ventricular bigeminy.   Problem List and Plan

## 2011-05-27 NOTE — Assessment & Plan Note (Signed)
Symptomatically stable. Plan to continue medical therapy and observation. Discussed warning signs. Also reviewed diet and exercise.

## 2011-07-10 ENCOUNTER — Other Ambulatory Visit: Payer: Self-pay | Admitting: *Deleted

## 2011-07-10 MED ORDER — LISINOPRIL 20 MG PO TABS: 20.0000 mg | ORAL_TABLET | Freq: Every day | ORAL | Status: DC

## 2011-07-28 ENCOUNTER — Telehealth: Payer: Self-pay | Admitting: *Deleted

## 2011-07-28 NOTE — Telephone Encounter (Signed)
Received notice from pt's insurance [facility]) polypharmacy program that pt was having both atorvastatin and pravastatin filled. According to our medication list from most recent OV, pt was only taking pravastatin. Spoke with pt's pharmacy [facility]) who states pt has been having them both filled. Spoke with pt's wife who confirms pt has both bottles and is taking them both. Discussed with Gene who states pt should d/c atorvastatin, continue pravastatin 40 mg every night and have FLP/LFT done in 12 weeks. Pt's wife and pharmacy notified.

## 2011-09-05 ENCOUNTER — Other Ambulatory Visit: Payer: Self-pay | Admitting: Cardiovascular Disease

## 2011-09-05 NOTE — Telephone Encounter (Signed)
Refilled generic plavix

## 2011-09-06 ENCOUNTER — Other Ambulatory Visit: Payer: Self-pay | Admitting: Emergency Medicine

## 2011-10-07 ENCOUNTER — Encounter: Payer: Self-pay | Admitting: *Deleted

## 2011-10-07 ENCOUNTER — Other Ambulatory Visit: Payer: Self-pay | Admitting: *Deleted

## 2011-10-07 DIAGNOSIS — Z79899 Other long term (current) drug therapy: Secondary | ICD-10-CM

## 2011-10-07 DIAGNOSIS — E785 Hyperlipidemia, unspecified: Secondary | ICD-10-CM

## 2011-10-16 ENCOUNTER — Telehealth: Payer: Self-pay | Admitting: *Deleted

## 2011-10-16 MED ORDER — PRAVASTATIN SODIUM 80 MG PO TABS: 80.0000 mg | ORAL_TABLET | Freq: Every evening | ORAL | Status: DC

## 2011-10-16 NOTE — Telephone Encounter (Signed)
Pt notified of results and verbalized understanding  

## 2011-10-16 NOTE — Telephone Encounter (Signed)
Message copied by Marcille Buffy on Thu Oct 16, 2011  4:41 PM ------      Message from: Donney Dice      Created: Tue Oct 14, 2011 10:58 AM       LDL increase to 143. Therefore, increase Pravachol to 80 mg daily, and repeat FLP/LFTs in 12 weeks.

## 2011-10-31 ENCOUNTER — Other Ambulatory Visit: Payer: Self-pay | Admitting: Cardiology

## 2011-12-03 ENCOUNTER — Ambulatory Visit: Payer: Medicare Other | Admitting: Cardiology

## 2011-12-11 ENCOUNTER — Other Ambulatory Visit: Payer: Self-pay | Admitting: Cardiovascular Disease

## 2011-12-26 ENCOUNTER — Telehealth: Payer: Self-pay | Admitting: *Deleted

## 2011-12-26 ENCOUNTER — Encounter: Payer: Self-pay | Admitting: *Deleted

## 2011-12-26 DIAGNOSIS — Z79899 Other long term (current) drug therapy: Secondary | ICD-10-CM

## 2011-12-26 DIAGNOSIS — E785 Hyperlipidemia, unspecified: Secondary | ICD-10-CM

## 2011-12-26 NOTE — Telephone Encounter (Signed)
Message copied by [name] on Fri Dec 26, 2011  2:58 PM ------      Message from: Marcille Buffy      Created: Thu Oct 16, 2011  4:42 PM      Regarding: MAIL REMINDER LETTER       PT NEEDS LETTER MAILED FOR FLP/LFT 272.4,V58.69---WAS TO REPEAT LABS 12 WKS AFTER INCREASING PRAVACHOL TO 80

## 2011-12-27 ENCOUNTER — Other Ambulatory Visit: Payer: Self-pay | Admitting: Physician Assistant

## 2012-01-02 ENCOUNTER — Ambulatory Visit (INDEPENDENT_AMBULATORY_CARE_PROVIDER_SITE_OTHER): Payer: Medicare Other | Admitting: Cardiology

## 2012-01-02 ENCOUNTER — Encounter: Payer: Self-pay | Admitting: Cardiology

## 2012-01-02 VITALS — BP 136/73 | HR 60 | Ht 75.0 in | Wt 261.0 lb

## 2012-01-02 DIAGNOSIS — I2510 Atherosclerotic heart disease of native coronary artery without angina pectoris: Secondary | ICD-10-CM

## 2012-01-02 DIAGNOSIS — Z136 Encounter for screening for cardiovascular disorders: Secondary | ICD-10-CM

## 2012-01-02 DIAGNOSIS — I10 Essential (primary) hypertension: Secondary | ICD-10-CM

## 2012-01-02 DIAGNOSIS — E669 Obesity, unspecified: Secondary | ICD-10-CM

## 2012-01-02 DIAGNOSIS — E785 Hyperlipidemia, unspecified: Secondary | ICD-10-CM

## 2012-01-02 NOTE — Assessment & Plan Note (Signed)
Symptomatically stable without active angina. Followup ECG is also stable. Continue medical therapy and observation.

## 2012-01-02 NOTE — Progress Notes (Signed)
Clinical Summary Paul Wood is a 78 y.o.male presenting for followup. He was seen in November 2012. He is here with his wife. He reports decreased energy, but no angina or progressive shortness of breath. He admits that he has not been exercising. We discussed a walking regimen today.  Lab work from March reviewed finding BUN 24, creatinine 1.3, potassium 4.0, hemoglobin 12.2, platelets 157. Recent lipid panel shows cholesterol 108, HDL 45, LDL 54, triglycerides 45, ALT 25, AST 27. He has been tolerating Lipitor.  We reviewed his medication today. His wife keeps track of these.   No Known Allergies  Current Outpatient Prescriptions  Medication Sig Dispense Refill  . allopurinol (ZYLOPRIM) 300 MG tablet Take 300 mg by mouth daily.        Marland Kitchen aspirin 81 MG tablet Take 81 mg by mouth daily.        Marland Kitchen atorvastatin (LIPITOR) 80 MG tablet TAKE 1 TABLET AT BEDTIME  30 tablet  6  . carvedilol (COREG) 12.5 MG tablet Take 12.5 mg by mouth 2 (two) times daily with a meal.        . citalopram (CELEXA) 20 MG tablet Take 20 mg by mouth daily.        . clopidogrel (PLAVIX) 75 MG tablet TAKE 1 TABLET BY MOUTH DAILY  30 tablet  6  . colchicine 0.6 MG tablet Take one by mouth twice daily        . insulin glargine (LANTUS SOLOSTAR) 100 UNIT/ML injection Inject 12 Units into the skin at bedtime.       Marland Kitchen levothyroxine (SYNTHROID, LEVOTHROID) 75 MCG tablet Take 75 mcg by mouth daily.        Marland Kitchen lisinopril (PRINIVIL,ZESTRIL) 20 MG tablet Take 1 tablet (20 mg total) by mouth daily.  30 tablet  6  . metFORMIN (GLUCOPHAGE) 1000 MG tablet Take 1,000 mg by mouth 2 (two) times daily with a meal.        . Tamsulosin HCl (FLOMAX) 0.4 MG CAPS Take one by mouth daily        . traMADol (ULTRAM) 50 MG tablet Take 50 mg by mouth 2 (two) times daily as needed.        Past Medical History  Diagnosis Date  . Hypothyroidism   . Mixed hyperlipidemia   . Ventral hernia   . Hearing loss   . Proteinuria     Mild  . Gout   .  Hypertrophy of prostate without urinary obstruction and other lower urinary tract symptoms (LUTS)   . PUD (peptic ulcer disease)   . Stroke     2008  . Depression   . Essential hypertension, benign   . Type 2 diabetes mellitus   . Coronary atherosclerosis of native coronary artery     4/12 - DES mid RCA, DES distal LAD, DES x2 mid LAD    Social History Paul Wood reports that he has never smoked. He has never used smokeless tobacco. Paul Wood reports that he does not drink alcohol.  Review of Systems No palpitations, falls, syncope. Stable appetite. No bleeding problems. Otherwise negative.  Physical Examination Filed Vitals:   01/02/12 0913  BP: 136/73  Pulse: 17    Morbidly obese male in no acute distress.  HEENT: Conjunctiva and lids normal, oropharynx clear with moist mucosa.  Neck: Supple, increased girth, no elevated JVP or carotid bruits.  Lungs: Clear to auscultation, nonlabored breathing at rest.  Cardiac: Regular rate and rhythm with ectopy, no S3 or  significant systolic murmur, no pericardial rub.  Abdomen: Obese, nontender, bowel sounds present.  Extremities: No pitting edema, distal pulses 2+.  Skin: Warm and dry.  Musculoskeletal: No kyphosis.  Neuropsychiatric: Alert and oriented x3, affect grossly appropriate.   ECG Reviewed in EMR.  Problem List and Plan   Coronary atherosclerosis of native coronary artery Symptomatically stable without active angina. Followup ECG is also stable. Continue medical therapy and observation.  Essential hypertension, benign Blood pressure is reasonable today. No changes made.  HYPERLIPIDEMIA Lipid numbers look good, LDL much better controlled on Lipitor. Followup FLP and LFT for next visit.  OBESITY Discussed diet and a walking regimen.    Satira Sark, M.D., F.A.C.C.

## 2012-01-02 NOTE — Assessment & Plan Note (Signed)
Lipid numbers look good, LDL much better controlled on Lipitor. Followup FLP and LFT for next visit.

## 2012-01-02 NOTE — Assessment & Plan Note (Signed)
Discussed diet and a walking regimen.

## 2012-01-02 NOTE — Patient Instructions (Addendum)
Your physician recommends that you schedule a follow-up appointment in: 6 months with Dr. Domenic Polite. You will receive a reminder letter in the mail in about 4 months reminding you to call and schedule your appointment. If you don't receive this letter, please contact our office.   Your physician recommends that you continue on your current medications as directed. Please refer to the Current Medication list given to you today.   Your physician recommends that you return for a FASTING lipid/liver profile: in 6 months just before your next visit at [place] will receive a reminder letter in the mail in about 17months reminding you to have this done. If you don't receive this letter, please contact our office.

## 2012-01-02 NOTE — Assessment & Plan Note (Signed)
Blood pressure is reasonable today. No changes made. 

## 2012-02-23 ENCOUNTER — Other Ambulatory Visit: Payer: Self-pay | Admitting: Cardiovascular Disease

## 2012-03-30 ENCOUNTER — Other Ambulatory Visit: Payer: Self-pay | Admitting: *Deleted

## 2012-03-30 ENCOUNTER — Telehealth: Payer: Self-pay | Admitting: *Deleted

## 2012-03-30 DIAGNOSIS — Z79899 Other long term (current) drug therapy: Secondary | ICD-10-CM

## 2012-03-30 DIAGNOSIS — E785 Hyperlipidemia, unspecified: Secondary | ICD-10-CM

## 2012-03-30 NOTE — Telephone Encounter (Signed)
Called and informed son and patient that lab orders will be sent in mail for fasting lab work that's due before his appointment.

## 2012-03-30 NOTE — Telephone Encounter (Signed)
Message copied by [name] on Tue Mar 30, 2012  9:25 AM ------      Message from: Delfino Lovett T      Created: Thu Mar 25, 2012 12:57 PM      Regarding: NEEDS LAB ORDERS WRITTEN AND MAILED TO PATIENT       Patient has appt. In Dec. Needs labs before she sees Doctor.      Thank you.

## 2012-05-12 ENCOUNTER — Other Ambulatory Visit: Payer: Self-pay | Admitting: Cardiology

## 2012-06-17 ENCOUNTER — Ambulatory Visit (INDEPENDENT_AMBULATORY_CARE_PROVIDER_SITE_OTHER): Payer: Medicare Other | Admitting: Cardiology

## 2012-06-17 ENCOUNTER — Encounter: Payer: Self-pay | Admitting: Cardiology

## 2012-06-17 VITALS — BP 184/75 | HR 54 | Ht 74.0 in | Wt 259.0 lb

## 2012-06-17 DIAGNOSIS — I10 Essential (primary) hypertension: Secondary | ICD-10-CM

## 2012-06-17 DIAGNOSIS — E785 Hyperlipidemia, unspecified: Secondary | ICD-10-CM

## 2012-06-17 DIAGNOSIS — I2510 Atherosclerotic heart disease of native coronary artery without angina pectoris: Secondary | ICD-10-CM

## 2012-06-17 NOTE — Assessment & Plan Note (Signed)
Symptomatically stable on medical therapy. No changes made today. I recommended he continue his walking regimen, continue to work on weight loss and his sodium intake. Routine followup arranged.

## 2012-06-17 NOTE — Assessment & Plan Note (Signed)
On statin therapy, followed by Dr. Pleas Koch. Goal LDL should be close to 70 if possible.

## 2012-06-17 NOTE — Progress Notes (Signed)
Clinical Summary Mr. Loo is a 78 y.o.male presenting for followup. He was last seen in June. He is here with his wife. Moses no specific complaints. Feels his breathing status is stable, he has not had any angina. Reports compliance with his medications. Lipids are followed by Dr. Pleas Koch.  Echocardiogram from last year showed LVEF 0000000 with diastolic dysfunction, mild left atrial enlargement.   Blood pressure is elevated today. We did discuss this. It has been up and down over time, was previously much better controlled. We discussed his sodium intake which is not optimal at this point. He does state that he is walking up to 3 miles at a time for exercise. His wife has an automated blood pressure cuff at home, but needs to get new batteries.  No Known Allergies  Current Outpatient Prescriptions  Medication Sig Dispense Refill  . allopurinol (ZYLOPRIM) 300 MG tablet Take 300 mg by mouth daily.        Marland Kitchen aspirin 81 MG tablet Take 81 mg by mouth daily.        Marland Kitchen atorvastatin (LIPITOR) 80 MG tablet TAKE 1 TABLET AT BEDTIME  30 tablet  6  . carvedilol (COREG) 12.5 MG tablet TAKE 1 TABLET TWICE DAILY EMERGENCY REFILL FAXED DR  60 tablet  6  . citalopram (CELEXA) 20 MG tablet Take 20 mg by mouth daily.        . clopidogrel (PLAVIX) 75 MG tablet TAKE 1 TABLET BY MOUTH DAILY  30 tablet  6  . colchicine 0.6 MG tablet Take one by mouth twice daily        . insulin glargine (LANTUS SOLOSTAR) 100 UNIT/ML injection Inject 12 Units into the skin at bedtime.       Marland Kitchen levothyroxine (SYNTHROID, LEVOTHROID) 75 MCG tablet Take 75 mcg by mouth daily.        Marland Kitchen lisinopril (PRINIVIL,ZESTRIL) 20 MG tablet TAKE 1 TABLET BY MOUTH EVERY DAY  30 tablet  6  . metFORMIN (GLUCOPHAGE) 1000 MG tablet Take 1,000 mg by mouth 2 (two) times daily with a meal.        . Tamsulosin HCl (FLOMAX) 0.4 MG CAPS Take one by mouth daily        . traMADol (ULTRAM) 50 MG tablet Take 50 mg by mouth 2 (two) times daily as needed.         Past Medical History  Diagnosis Date  . Hypothyroidism   . Mixed hyperlipidemia   . Ventral hernia   . Hearing loss   . Proteinuria     Mild  . Gout   . Hypertrophy of prostate without urinary obstruction and other lower urinary tract symptoms (LUTS)   . PUD (peptic ulcer disease)   . Stroke     2008  . Depression   . Essential hypertension, benign   . Type 2 diabetes mellitus   . Coronary atherosclerosis of native coronary artery     4/12 - DES mid RCA, DES distal LAD, DES x2 mid LAD    Social History Mr. Reville reports that he has never smoked. He has never used smokeless tobacco. Mr. Cortez reports that he does not drink alcohol.  Review of Systems No palpitations. No reported bleeding problems. No orthopnea or PND. Otherwise negative  Physical Examination Filed Vitals:   06/17/12 1024  BP: 184/75  Pulse: 54   Filed Weights   06/17/12 1024  Weight: 259 lb (117.482 kg)    No acute distress.  HEENT: Conjunctiva and  lids normal, oropharynx clear with moist mucosa.  Neck: Supple, increased girth, no elevated JVP or carotid bruits.  Lungs: Clear to auscultation, nonlabored breathing at rest.  Cardiac: Regular rate and rhythm, no S3 or significant systolic murmur, no pericardial rub.  Abdomen: Obese, nontender, bowel sounds present.  Extremities: No pitting edema, distal pulses 2+.  Skin: Warm and dry.  Musculoskeletal: No kyphosis.  Neuropsychiatric: Alert and oriented x3, affect grossly appropriate.   Problem List and Plan   Coronary atherosclerosis of native coronary artery Symptomatically stable on medical therapy. No changes made today. I recommended he continue his walking regimen, continue to work on weight loss and his sodium intake. Routine followup arranged.  Essential hypertension, benign Blood pressure up significantly today. He reports compliance with medications, although salt intake seems excessive. Reviewed diet, exercise, also checking blood  pressure at home with there automated cuff. If trend is truly remaining elevated, he will need further medication adjustments, and can followup with Dr. Pleas Koch in the interim.  HYPERLIPIDEMIA On statin therapy, followed by Dr. Pleas Koch. Goal LDL should be close to 70 if possible.    Satira Sark, M.D., F.A.C.C.

## 2012-06-17 NOTE — Assessment & Plan Note (Signed)
Blood pressure up significantly today. He reports compliance with medications, although salt intake seems excessive. Reviewed diet, exercise, also checking blood pressure at home with there automated cuff. If trend is truly remaining elevated, he will need further medication adjustments, and can followup with Dr. Pleas Koch in the interim.

## 2012-06-17 NOTE — Patient Instructions (Addendum)

## 2012-07-09 ENCOUNTER — Other Ambulatory Visit: Payer: Self-pay | Admitting: Cardiology

## 2012-09-03 ENCOUNTER — Other Ambulatory Visit: Payer: Self-pay | Admitting: Cardiovascular Disease

## 2012-11-02 ENCOUNTER — Encounter: Payer: Self-pay | Admitting: Cardiology

## 2013-01-10 ENCOUNTER — Ambulatory Visit (INDEPENDENT_AMBULATORY_CARE_PROVIDER_SITE_OTHER): Payer: Medicare Other | Admitting: Cardiology

## 2013-01-10 ENCOUNTER — Encounter: Payer: Self-pay | Admitting: Cardiology

## 2013-01-10 VITALS — BP 170/77 | HR 46 | Ht 74.0 in | Wt 253.0 lb

## 2013-01-10 DIAGNOSIS — I10 Essential (primary) hypertension: Secondary | ICD-10-CM

## 2013-01-10 DIAGNOSIS — E785 Hyperlipidemia, unspecified: Secondary | ICD-10-CM

## 2013-01-10 DIAGNOSIS — I2510 Atherosclerotic heart disease of native coronary artery without angina pectoris: Secondary | ICD-10-CM

## 2013-01-10 NOTE — Patient Instructions (Signed)
Your physician recommends that you continue on your current medications as directed. Please refer to the Current Medication list given to you today.  Your physician wants you to follow-up in: 6 month. You will receive a reminder letter in the mail two months in advance. If you don't receive a letter, please call our office to schedule the follow-up appointment.  

## 2013-01-10 NOTE — Progress Notes (Signed)
Clinical Summary Mr. Branton is a 79 y.o.male last seen in December 2013.  He is here with his wife. Reports no angina symptoms, stable dyspnea on exertion. ECG shows sinus rhythm with left anterior fascicular block.  CYP2C19 testing was done to the patient's pharmacy, adequate metabolizer of Plavix. Lab work from last June showed AST 27, ALT 25, triglycerides 45, cholesterol 108, HDL 45, LDL 54.  He reports compliance with his medications including antihypertensives. Due for a physical and lab work with Dr. Pleas Koch in the next month.  No Known Allergies  Current Outpatient Prescriptions  Medication Sig Dispense Refill  . allopurinol (ZYLOPRIM) 300 MG tablet Take 300 mg by mouth daily.        Marland Kitchen aspirin 81 MG tablet Take 81 mg by mouth daily.        Marland Kitchen atorvastatin (LIPITOR) 80 MG tablet TAKE 1 TABLET AT BEDTIME EMERGENCY REFILL FAXED DR  30 tablet  3  . carvedilol (COREG) 12.5 MG tablet TAKE 1 TABLET TWICE DAILY EMERGENCY REFILL FAXED DR  60 tablet  6  . citalopram (CELEXA) 20 MG tablet Take 20 mg by mouth daily.        . clopidogrel (PLAVIX) 75 MG tablet TAKE 1 TABLET BY MOUTH DAILY  30 tablet  6  . colchicine 0.6 MG tablet Take one by mouth twice daily        . insulin glargine (LANTUS SOLOSTAR) 100 UNIT/ML injection Inject 12 Units into the skin at bedtime.       Marland Kitchen levothyroxine (SYNTHROID, LEVOTHROID) 88 MCG tablet Take 88 mcg by mouth daily before breakfast.      . lisinopril (PRINIVIL,ZESTRIL) 20 MG tablet TAKE 1 TABLET BY MOUTH EVERY DAY  30 tablet  6  . metFORMIN (GLUCOPHAGE) 1000 MG tablet Take 1,000 mg by mouth 2 (two) times daily with a meal.        . traMADol (ULTRAM) 50 MG tablet Take 50 mg by mouth 2 (two) times daily as needed.       No current facility-administered medications for this visit.    Past Medical History  Diagnosis Date  . Hypothyroidism   . Mixed hyperlipidemia   . Ventral hernia   . Hearing loss   . Proteinuria     Mild  . Gout   . Hypertrophy of  prostate without urinary obstruction and other lower urinary tract symptoms (LUTS)   . PUD (peptic ulcer disease)   . Stroke     2008  . Depression   . Essential hypertension, benign   . Type 2 diabetes mellitus   . Coronary atherosclerosis of native coronary artery     4/12 - DES mid RCA, DES distal LAD, DES x2 mid LAD    Social History Mr. Kuehler reports that he has never smoked. He has never used smokeless tobacco. Mr. Pleva reports that he does not drink alcohol.  Review of Systems No palpitations, no syncope, no claudication. Otherwise negative.  Physical Examination Filed Vitals:   01/10/13 0906  BP: 170/77  Pulse: 46   Filed Weights   01/10/13 0906  Weight: 253 lb (114.76 kg)    No acute distress.  HEENT: Conjunctiva and lids normal, oropharynx clear with moist mucosa.  Neck: Supple, increased girth, no elevated JVP or carotid bruits.  Lungs: Clear to auscultation, nonlabored breathing at rest.  Cardiac: Regular rate and rhythm, no S3 or significant systolic murmur, no pericardial rub.  Abdomen: Obese, nontender, bowel sounds present.  Extremities: No  pitting edema, distal pulses 2+.  Skin: Warm and dry.  Musculoskeletal: No kyphosis.  Neuropsychiatric: Alert and oriented x3, affect grossly appropriate.   Problem List and Plan   Coronary atherosclerosis of native coronary artery Symptomatically stable medical therapy, ECG reviewed. No changes made today.  Essential hypertension, benign Blood pressure is elevated. He reports medication compliance. We discussed sodium restriction. He will be following up with lab work (history of renal insufficiency) and office visit with Dr. Pleas Koch soon. May need to have lisinopril advanced, or possibly the addition of a diuretic.  HYPERLIPIDEMIA Continues on Lipitor, has had good control of LDL over time.    Satira Sark, M.D., F.A.C.C. \

## 2013-01-10 NOTE — Assessment & Plan Note (Signed)
Continues on Lipitor, has had good control of LDL over time.

## 2013-01-10 NOTE — Assessment & Plan Note (Signed)
Blood pressure is elevated. He reports medication compliance. We discussed sodium restriction. He will be following up with lab work (history of renal insufficiency) and office visit with Dr. Pleas Koch soon. May need to have lisinopril advanced, or possibly the addition of a diuretic.

## 2013-01-10 NOTE — Assessment & Plan Note (Signed)
Symptomatically stable medical therapy, ECG reviewed. No changes made today.

## 2013-01-13 ENCOUNTER — Other Ambulatory Visit: Payer: Self-pay | Admitting: Cardiology

## 2013-02-04 ENCOUNTER — Other Ambulatory Visit: Payer: Self-pay | Admitting: Cardiovascular Disease

## 2013-02-04 NOTE — Telephone Encounter (Signed)
RX request, Carvedilol

## 2013-02-17 ENCOUNTER — Other Ambulatory Visit: Payer: Self-pay | Admitting: Cardiology

## 2013-04-25 ENCOUNTER — Other Ambulatory Visit: Payer: Self-pay | Admitting: Cardiology

## 2013-04-25 ENCOUNTER — Other Ambulatory Visit: Payer: Self-pay | Admitting: *Deleted

## 2013-04-25 MED ORDER — CLOPIDOGREL BISULFATE 75 MG PO TABS: 75.0000 mg | ORAL_TABLET | Freq: Every day | ORAL | Status: DC

## 2013-05-30 ENCOUNTER — Other Ambulatory Visit: Payer: Self-pay | Admitting: Cardiology

## 2013-06-14 ENCOUNTER — Other Ambulatory Visit: Payer: Self-pay | Admitting: Cardiology

## 2013-08-01 ENCOUNTER — Encounter: Payer: Self-pay | Admitting: Cardiology

## 2013-08-01 ENCOUNTER — Ambulatory Visit (INDEPENDENT_AMBULATORY_CARE_PROVIDER_SITE_OTHER): Payer: Medicare Other | Admitting: Cardiology

## 2013-08-01 VITALS — BP 181/78 | HR 52 | Ht 74.0 in | Wt 257.0 lb

## 2013-08-01 DIAGNOSIS — E785 Hyperlipidemia, unspecified: Secondary | ICD-10-CM

## 2013-08-01 DIAGNOSIS — I10 Essential (primary) hypertension: Secondary | ICD-10-CM

## 2013-08-01 DIAGNOSIS — I2510 Atherosclerotic heart disease of native coronary artery without angina pectoris: Secondary | ICD-10-CM

## 2013-08-01 NOTE — Patient Instructions (Signed)

## 2013-08-01 NOTE — Assessment & Plan Note (Signed)
Continues on high-dose Lipitor. Interval lab work being requested.

## 2013-08-01 NOTE — Assessment & Plan Note (Signed)
Continue medical therapy and observation. Followup arranged in 6 months.

## 2013-08-01 NOTE — Progress Notes (Signed)
Clinical Summary Paul Wood is a 80 y.o.male last seen in July 2014. He reports no angina symptoms on medical therapy. States that he is walking 3 miles at a time in the mornings with his son. Reports compliance with his medications.  Blood pressure is elevated today. I asked him to followup with Dr. Pleas Koch as he may need further medication adjustments. He did have interval lab work which will be requested for review.   No Known Allergies  Current Outpatient Prescriptions  Medication Sig Dispense Refill  . allopurinol (ZYLOPRIM) 300 MG tablet Take 300 mg by mouth daily.        Marland Kitchen aspirin 81 MG tablet Take 81 mg by mouth daily.        Marland Kitchen atorvastatin (LIPITOR) 80 MG tablet TAKE 1 TABLET BY MOUTH AT BEDTIME  30 tablet  3  . carvedilol (COREG) 12.5 MG tablet TAKE 1 TABLET TWICE DAILY  60 tablet  6  . citalopram (CELEXA) 20 MG tablet Take 20 mg by mouth daily.        . clopidogrel (PLAVIX) 75 MG tablet Take 1 tablet (75 mg total) by mouth daily.  30 tablet  6  . colchicine 0.6 MG tablet Take one by mouth twice daily        . insulin glargine (LANTUS SOLOSTAR) 100 UNIT/ML injection Inject 12 Units into the skin at bedtime.       Marland Kitchen levothyroxine (SYNTHROID, LEVOTHROID) 88 MCG tablet Take 88 mcg by mouth daily before breakfast.      . lisinopril (PRINIVIL,ZESTRIL) 20 MG tablet TAKE 1 TABLET BY MOUTH EVERY DAY  30 tablet  6  . metFORMIN (GLUCOPHAGE) 1000 MG tablet Take 1,000 mg by mouth 2 (two) times daily with a meal.        . traMADol (ULTRAM) 50 MG tablet Take 50 mg by mouth 2 (two) times daily as needed.      . tamsulosin (FLOMAX) 0.4 MG CAPS capsule Take 0.4 mg by mouth daily.       No current facility-administered medications for this visit.    Past Medical History  Diagnosis Date  . Hypothyroidism   . Mixed hyperlipidemia   . Ventral hernia   . Hearing loss   . Proteinuria     Mild  . Gout   . Hypertrophy of prostate without urinary obstruction and other lower urinary tract  symptoms (LUTS)   . PUD (peptic ulcer disease)   . Stroke     2008  . Depression   . Essential hypertension, benign   . Type 2 diabetes mellitus   . Coronary atherosclerosis of native coronary artery     4/12 - DES mid RCA, DES distal LAD, DES x2 mid LAD    Social History Paul Wood reports that he has never smoked. He has never used smokeless tobacco. Paul Wood reports that he does not drink alcohol.  Review of Systems No palpitations, dizziness, syncope. No bleeding problems.  Physical Examination Filed Vitals:   08/01/13 1605  BP: 181/78  Pulse: 52   Filed Weights   08/01/13 1605  Weight: 257 lb (116.574 kg)    No acute distress.  HEENT: Conjunctiva and lids normal, oropharynx clear with moist mucosa.  Neck: Supple, increased girth, no elevated JVP or carotid bruits.  Lungs: Clear to auscultation, nonlabored breathing at rest.  Cardiac: Regular rate and rhythm, no S3 or significant systolic murmur, no pericardial rub.  Abdomen: Obese, nontender, bowel sounds present.  Extremities: No  pitting edema, distal pulses 2+.  Skin: Warm and dry.  Musculoskeletal: No kyphosis.  Neuropsychiatric: Alert and oriented x3, affect grossly appropriate.   Problem List and Plan   Coronary atherosclerosis of native coronary artery Continue medical therapy and observation. Followup arranged in 6 months.  Essential hypertension, benign Blood pressure remains elevated. Requesting interval lab work done by Dr. Pleas Koch. I recommended that he followup with primary care for further medication adjustments.  HYPERLIPIDEMIA Continues on high-dose Lipitor. Interval lab work being requested.    Satira Sark, M.D., F.A.C.C.

## 2013-08-01 NOTE — Assessment & Plan Note (Signed)
Blood pressure remains elevated. Requesting interval lab work done by Dr. Pleas Koch. I recommended that he followup with primary care for further medication adjustments.

## 2013-11-01 ENCOUNTER — Other Ambulatory Visit: Payer: Self-pay | Admitting: Cardiology

## 2013-11-05 ENCOUNTER — Other Ambulatory Visit: Payer: Self-pay | Admitting: Cardiology

## 2013-11-18 ENCOUNTER — Other Ambulatory Visit: Payer: Self-pay | Admitting: Cardiology

## 2013-12-01 ENCOUNTER — Other Ambulatory Visit: Payer: Self-pay | Admitting: Cardiology

## 2013-12-01 ENCOUNTER — Other Ambulatory Visit: Payer: Self-pay | Admitting: Cardiovascular Disease

## 2013-12-17 ENCOUNTER — Encounter: Payer: Self-pay | Admitting: Cardiology

## 2013-12-28 ENCOUNTER — Other Ambulatory Visit: Payer: Self-pay | Admitting: Cardiology

## 2013-12-29 ENCOUNTER — Encounter: Payer: Medicare Other | Admitting: Cardiology

## 2014-01-04 HISTORY — PX: HERNIA REPAIR: SHX51

## 2014-01-19 ENCOUNTER — Encounter: Payer: Self-pay | Admitting: *Deleted

## 2014-01-20 ENCOUNTER — Ambulatory Visit (INDEPENDENT_AMBULATORY_CARE_PROVIDER_SITE_OTHER): Payer: Medicare Other | Admitting: Cardiology

## 2014-01-20 ENCOUNTER — Other Ambulatory Visit: Payer: Self-pay | Admitting: *Deleted

## 2014-01-20 ENCOUNTER — Encounter: Payer: Self-pay | Admitting: Cardiology

## 2014-01-20 VITALS — BP 143/70 | HR 59 | Ht 74.0 in | Wt 256.0 lb

## 2014-01-20 DIAGNOSIS — I2510 Atherosclerotic heart disease of native coronary artery without angina pectoris: Secondary | ICD-10-CM

## 2014-01-20 DIAGNOSIS — N182 Chronic kidney disease, stage 2 (mild): Secondary | ICD-10-CM

## 2014-01-20 DIAGNOSIS — R0602 Shortness of breath: Secondary | ICD-10-CM

## 2014-01-20 HISTORY — DX: Shortness of breath: R06.02

## 2014-01-20 MED ORDER — FUROSEMIDE 20 MG PO TABS: 20.0000 mg | ORAL_TABLET | Freq: Every day | ORAL | Status: DC

## 2014-01-20 NOTE — Patient Instructions (Signed)
Your physician recommends that you schedule a follow-up appointment in: 1 month. Your physician has recommended you make the following change in your medication:  Start furosemide 20 mg daily. Continue all other medications the same. Your physician has requested that you have an echocardiogram. Echocardiography is a painless test that uses sound waves to create images of your heart. It provides your doctor with information about the size and shape of your heart and how well your heart's chambers and valves are working. This procedure takes approximately one hour. There are no restrictions for this procedure. Your physician recommends that you have lab work in 1 month just before your next visit to check your BMET.

## 2014-01-20 NOTE — Assessment & Plan Note (Signed)
Creatinine 1.1 in June.

## 2014-01-20 NOTE — Progress Notes (Signed)
Clinical Summary [name] is an 80 y.o.male last seen in January. He is here with his wife today. He tells me that he underwent hernia surgery approximately one month ago. From a cardiac perspective he reports no angina symptoms. He does describe a sense of orthopnea and shortness of breath at nighttime, also anxiety. Feels like he is "closed in." He has also had leg edema.  Lab work from June showed BUN 19, creatinine 1.1, potassium 4.0, normal AST and ALT, cholesterol 102, triglycerides 34, HDL 47, LDL 48, hemoglobin A1c 6.5.  Echocardiogram from March 2012 showed mild LVH with LVEF 0000000, diastolic dysfunction with increased left atrial pressure, mildly dilated left atrium, normal RV function, trace aortic regurgitation.   No Known Allergies  Current Outpatient Prescriptions  Medication Sig Dispense Refill  . allopurinol (ZYLOPRIM) 300 MG tablet Take 300 mg by mouth daily.        Marland Kitchen aspirin 81 MG tablet Take 81 mg by mouth daily.        Marland Kitchen atorvastatin (LIPITOR) 80 MG tablet TAKE 1 TABLET BY MOUTH AT BEDTIME (PATIENT NEEDS OFFICE VISIT)  30 tablet  0  . carvedilol (COREG) 12.5 MG tablet TAKE 1 TABLET BY MOUTH TWICE DAILY  60 tablet  0  . citalopram (CELEXA) 20 MG tablet Take 20 mg by mouth daily.        . clopidogrel (PLAVIX) 75 MG tablet TAKE 1 TABLET BY MOUTH EVERY DAY (PATIENT NEEDS OFFICE VISIT)  30 tablet  0  . insulin glargine (LANTUS SOLOSTAR) 100 UNIT/ML injection Inject 12 Units into the skin at bedtime.       Marland Kitchen levothyroxine (SYNTHROID, LEVOTHROID) 88 MCG tablet Take 88 mcg by mouth daily before breakfast.      . lisinopril (PRINIVIL,ZESTRIL) 20 MG tablet TAKE 1 TABLET BY MOUTH EVERY DAY  30 tablet  0  . metFORMIN (GLUCOPHAGE) 1000 MG tablet Take 1,000 mg by mouth 2 (two) times daily with a meal.        . tamsulosin (FLOMAX) 0.4 MG CAPS capsule Take 0.4 mg by mouth daily.      . traMADol (ULTRAM) 50 MG tablet Take 50 mg by mouth 2 (two) times daily as needed.      .  colchicine 0.6 MG tablet Take one by mouth twice daily        . furosemide (LASIX) 20 MG tablet Take 1 tablet (20 mg total) by mouth daily.  90 tablet  3   No current facility-administered medications for this visit.    Past Medical History  Diagnosis Date  . Hypothyroidism   . Mixed hyperlipidemia   . Ventral hernia   . Hearing loss   . Proteinuria     Mild  . Gout   . Hypertrophy of prostate without urinary obstruction and other lower urinary tract symptoms (LUTS)   . PUD (peptic ulcer disease)   . Stroke     2008  . Depression   . Essential hypertension, benign   . Type 2 diabetes mellitus   . Coronary atherosclerosis of native coronary artery     4/12 - DES mid RCA, DES distal LAD, DES x2 mid LAD  . Incarcerated umbilical hernia     Social History Paul Wood reports that he has never smoked. He has never used smokeless tobacco. Paul Wood reports that he does not drink alcohol.  Review of Systems No palpitations or syncope. Appetite is good. Other systems reviewed and negative except as outlined.  Physical Examination Filed Vitals:   01/20/14 1440  BP: 143/70  Pulse: 59   Filed Weights   01/20/14 1440  Weight: 256 lb (116.121 kg)    No acute distress.  HEENT: Conjunctiva and lids normal, oropharynx clear with moist mucosa.  Neck: Supple, increased girth, no elevated JVP or carotid bruits.  Lungs: Clear to auscultation, decreased at bases, nonlabored breathing at rest.  Cardiac: Regular rate and rhythm, no S3 or significant systolic murmur.  Abdomen: Obese, nontender, bowel sounds present.  Extremities: 2+ edema, distal pulses 2+.  Skin: Warm and dry.  Musculoskeletal: No kyphosis.  Neuropsychiatric: Alert and oriented x3, affect grossly appropriate.   Problem List and Plan   Shortness of breath Also orthopnea and leg edema. Plan is to obtain an echocardiogram to reassess cardiac structure and function compared to 2012. We will also place him on Lasix 20  mg daily for now. Followup visit scheduled in the next month with BMET.  Coronary atherosclerosis of native coronary artery No active angina symptoms. Continue medical therapy and observation.  Chronic renal insufficiency Creatinine 1.1 in June.    Satira Sark, M.D., F.A.C.C.

## 2014-01-20 NOTE — Assessment & Plan Note (Signed)
No active angina symptoms. Continue medical therapy and observation. 

## 2014-01-20 NOTE — Assessment & Plan Note (Signed)
Also orthopnea and leg edema. Plan is to obtain an echocardiogram to reassess cardiac structure and function compared to 2012. We will also place him on Lasix 20 mg daily for now. Followup visit scheduled in the next month with BMET.

## 2014-01-27 ENCOUNTER — Other Ambulatory Visit: Payer: Self-pay | Admitting: Cardiology

## 2014-02-21 ENCOUNTER — Telehealth: Payer: Self-pay | Admitting: *Deleted

## 2014-02-21 MED ORDER — FUROSEMIDE 20 MG PO TABS: 20.0000 mg | ORAL_TABLET | ORAL | Status: DC

## 2014-02-21 NOTE — Telephone Encounter (Signed)
Message copied by [name] on Tue Feb 21, 2014  4:03 PM ------      Message from: Satira Sark      Created: Tue Feb 21, 2014  2:43 PM       Reviewed. Creatinine has increased from 1.1-1.6 since institution of Lasix 20 mg daily. Would ask that he cut it back to every other day for now. We can discuss further followup. ------

## 2014-02-21 NOTE — Telephone Encounter (Signed)
Patient informed. 

## 2014-02-22 ENCOUNTER — Other Ambulatory Visit: Payer: Self-pay

## 2014-02-22 ENCOUNTER — Other Ambulatory Visit (INDEPENDENT_AMBULATORY_CARE_PROVIDER_SITE_OTHER): Payer: Medicare Other

## 2014-02-22 DIAGNOSIS — I359 Nonrheumatic aortic valve disorder, unspecified: Secondary | ICD-10-CM

## 2014-02-22 DIAGNOSIS — I2510 Atherosclerotic heart disease of native coronary artery without angina pectoris: Secondary | ICD-10-CM

## 2014-02-22 DIAGNOSIS — R0602 Shortness of breath: Secondary | ICD-10-CM

## 2014-02-27 ENCOUNTER — Other Ambulatory Visit: Payer: Self-pay | Admitting: Cardiology

## 2014-02-28 ENCOUNTER — Telehealth: Payer: Self-pay | Admitting: *Deleted

## 2014-02-28 NOTE — Telephone Encounter (Signed)
Message copied by Laurine Blazer on Tue Feb 28, 2014 10:16 AM ------      Message from: Satira Sark      Created: Fri Feb 24, 2014  9:38 AM       Reviewed report. Patient has significant LVH, however normal LVEF of 60-65%, no deterioration since previous study. Recommend continue medical therapy, recently added Lasix for leg edema. Can discuss further office followup ------

## 2014-02-28 NOTE — Telephone Encounter (Signed)
Notes Recorded by Laurine Blazer, LPN on [id number] at [id number] AM Patient notified. Currently admitted to [facility] for dizziness. Has been since last pm, not sure when he will go home. Reminded patient that he has OV scheduled this Thursday with Dr. Domenic Polite. Advised to call if need to change. Patient verbalized understanding. ------

## 2014-03-02 ENCOUNTER — Ambulatory Visit: Payer: Medicare Other | Admitting: Cardiology

## 2014-03-21 ENCOUNTER — Encounter (INDEPENDENT_AMBULATORY_CARE_PROVIDER_SITE_OTHER): Payer: Self-pay

## 2014-03-21 ENCOUNTER — Encounter: Payer: Self-pay | Admitting: Neurology

## 2014-03-21 ENCOUNTER — Ambulatory Visit (INDEPENDENT_AMBULATORY_CARE_PROVIDER_SITE_OTHER): Payer: Medicare Other | Admitting: Neurology

## 2014-03-21 VITALS — BP 134/72 | HR 77 | Ht 71.5 in | Wt 242.4 lb

## 2014-03-21 DIAGNOSIS — I63219 Cerebral infarction due to unspecified occlusion or stenosis of unspecified vertebral arteries: Secondary | ICD-10-CM

## 2014-03-21 DIAGNOSIS — R413 Other amnesia: Secondary | ICD-10-CM

## 2014-03-21 NOTE — Patient Instructions (Signed)
I had a long discussion with the patient and his son regarding his recent stroke, discussed his neurological deficits on my examination findings and the need for aggressive risk factor modification and answered questions. Continue aspirin 81 mg and Plavix 75 mg daily for 3 months and then discontinue aspirin and stay on Plavix alone after that. Aggressive control of diabetes with insulin A1c goal below 6.5%, lipids with LDL cholesterol goal below 70 mg percent, hypertension with blood pressure goal below 120/80. Polysomnogram for sleep apnea. Check vitamin B12, TSH, RPR and EEG for treatable causes of memory loss. He was  advised not to drive because of significant peripheral vision loss Return for followup in 2 months or earlier if necessary.  Stroke Prevention Some medical conditions and behaviors are associated with an increased chance of having a stroke. You may prevent a stroke by making healthy choices and managing medical conditions. HOW CAN I REDUCE MY RISK OF HAVING A STROKE?   Stay physically active. Get at least 30 minutes of activity on most or all days.  Do not smoke. It may also be helpful to avoid exposure to secondhand smoke.  Limit alcohol use. Moderate alcohol use is considered to be:  No more than 2 drinks per day for men.  No more than 1 drink per day for nonpregnant women.  Eat healthy foods. This involves:  Eating 5 or more servings of fruits and vegetables a day.  Making dietary changes that address high blood pressure (hypertension), high cholesterol, diabetes, or obesity.  Manage your cholesterol levels.  Making food choices that are high in fiber and low in saturated fat, trans fat, and cholesterol may control cholesterol levels.  Take any prescribed medicines to control cholesterol as directed by your health care provider.  Manage your diabetes.  Controlling your carbohydrate and sugar intake is recommended to manage diabetes.  Take any prescribed medicines  to control diabetes as directed by your health care provider.  Control your hypertension.  Making food choices that are low in salt (sodium), saturated fat, trans fat, and cholesterol is recommended to manage hypertension.  Take any prescribed medicines to control hypertension as directed by your health care provider.  Maintain a healthy weight.  Reducing calorie intake and making food choices that are low in sodium, saturated fat, trans fat, and cholesterol are recommended to manage weight.  Stop drug abuse.  Avoid taking birth control pills.  Talk to your health care provider about the risks of taking birth control pills if you are over 11 years old, smoke, get migraines, or have ever had a blood clot.  Get evaluated for sleep disorders (sleep apnea).  Talk to your health care provider about getting a sleep evaluation if you snore a lot or have excessive sleepiness.  Take medicines only as directed by your health care provider.  For some people, aspirin or blood thinners (anticoagulants) are helpful in reducing the risk of forming abnormal blood clots that can lead to stroke. If you have the irregular heart rhythm of atrial fibrillation, you should be on a blood thinner unless there is a good reason you cannot take them.  Understand all your medicine instructions.  Make sure that other conditions (such as anemia or atherosclerosis) are addressed. SEEK IMMEDIATE MEDICAL CARE IF:   You have sudden weakness or numbness of the face, arm, or leg, especially on one side of the body.  Your face or eyelid droops to one side.  You have sudden confusion.  You have  trouble speaking (aphasia) or understanding.  You have sudden trouble seeing in one or both eyes.  You have sudden trouble walking.  You have dizziness.  You have a loss of balance or coordination.  You have a sudden, severe headache with no known cause.  You have new chest pain or an irregular heartbeat. Any of  these symptoms may represent a serious problem that is an emergency. Do not wait to see if the symptoms will go away. Get medical help at once. Call your local emergency services (911 in [place] Do not drive yourself to the hospital. Document Released: 07/31/2004 Document Revised: 11/07/2013 Document Reviewed: 12/24/2012 [facility] Patient Information 2015 [place], [place]. This information is not intended to replace advice given to you by your health care provider. Make sure you discuss any questions you have with your health care provider.

## 2014-03-21 NOTE — Progress Notes (Signed)
[facility] [address] [place]. [place] [address] [phone]       OFFICE CONSULT NOTE  [name]. Date of Birth:  [birth year] Medical Record Number:  [id number]   Referring MD:  Paul Wood  Reason for Referral:  Stroke  HPI: 80 year African American gentleman who is accompanied by his son who provides most of the history. The patient noticed some peripheral vision loss as well as confusion and difficulty driving for couple of days prior to being admitted to [facility] on 03/05/14. On exam there was found to have right-sided homonymous hemianopsia and CT scan of the brain suggested left occipital infarct. Subsequently an MRI scan of the brain was done which confirmed this. I have personally reviewed his records through care everywhere. Echocardiogram was unremarkable. Carotid Dopplers and Transcranial Doppler studies did not show any large vessel stenosis. Patient was felt to have left PCA infarct due to intracranial atherosclerosis and started on aspirin and Plavix which he seems to be tolerating well with only minor bruising. Patient did have a prior history of stroke 5 years ago in which he had some transient facial weakness which recovered within a few days. He does have multiple vascular factors in the form of coronary artery disease, diabetes, hypertension, hyperlipidemia, obesity and suspected sleep apnea. He has not yet had a polysomnogram or tried CPAP. He has noticed slight improvement in his confusion though his memory loss and cognitive difficulties persist as well as peripheral vision loss is yet unchanged.  ROS:   14 system review of systems is positive for memory loss, confusion, headache, dizziness, shortness of breath, blurred vision and all other systems negative  PMH:  Past Medical History  Diagnosis Date  . Hypothyroidism   . Mixed hyperlipidemia   . Ventral hernia   . Hearing loss   . Proteinuria     Mild  . Gout   .  Hypertrophy of prostate without urinary obstruction and other lower urinary tract symptoms (LUTS)   . PUD (peptic ulcer disease)   . Stroke     2008  . Depression   . Essential hypertension, benign   . Type 2 diabetes mellitus   . Coronary atherosclerosis of native coronary artery     4/12 - DES mid RCA, DES distal LAD, DES x2 mid LAD  . Incarcerated umbilical hernia     Social History:  History   Social History  . Marital Status: Married    Spouse Name: N/A    Number of Children: 4  . Years of Education: 9th   Occupational History  . Retired    Social History Main Topics  . Smoking status: Never Smoker   . Smokeless tobacco: Never Used  . Alcohol Use: No  . Drug Use: No  . Sexual Activity: Yes   Other Topics Concern  . 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    Medications:   Current Outpatient Prescriptions on File Prior to Visit  Medication Sig Dispense Refill  . allopurinol (ZYLOPRIM) 300 MG tablet Take 300 mg by mouth daily.        Marland Kitchen aspirin 81 MG tablet Take 81 mg by mouth daily.        Marland Kitchen atorvastatin (LIPITOR) 80 MG tablet TAKE 1 TABLET BY MOUTH AT BEDTIME (PATIENT NEEDS OFFICE VISIT)  30 tablet  3  . carvedilol (COREG) 12.5 MG tablet  TAKE 1 TABLET BY MOUTH TWICE DAILY  60 tablet  3  . citalopram (CELEXA) 20 MG tablet Take 20 mg by mouth daily.        . clopidogrel (PLAVIX) 75 MG tablet TAKE 1 TABLET BY MOUTH EVERY DAY (PATIENT NEEDS OFFICE VISIT)  30 tablet  3  . colchicine 0.6 MG tablet Take one by mouth twice daily        . furosemide (LASIX) 20 MG tablet Take 1 tablet (20 mg total) by mouth every other day.  45 tablet  3  . insulin glargine (LANTUS SOLOSTAR) 100 UNIT/ML injection Inject 12 Units into the skin at bedtime.       Marland Kitchen levothyroxine (SYNTHROID, LEVOTHROID) 88 MCG tablet Take 88 mcg by mouth daily before breakfast.      . lisinopril (PRINIVIL,ZESTRIL) 20 MG tablet  TAKE 1 TABLET BY MOUTH EVERY DAY  30 tablet  3  . metFORMIN (GLUCOPHAGE) 1000 MG tablet Take 1,000 mg by mouth 2 (two) times daily with a meal.        . tamsulosin (FLOMAX) 0.4 MG CAPS capsule Take 0.4 mg by mouth daily.      . traMADol (ULTRAM) 50 MG tablet Take 50 mg by mouth 2 (two) times daily as needed.       No current facility-administered medications on file prior to visit.    Allergies:  No Known Allergies  Physical Exam General: well developed, well nourished, seated, in no evident distress Head: head normocephalic and atraumatic. Orohparynx benign Neck: supple with no carotid or supraclavicular bruits Cardiovascular: regular rate and rhythm, no murmurs Musculoskeletal: no deformity Skin:  no rash/petichiae Vascular:  Normal pulses all extremities Filed Vitals:   03/21/14 1351  BP: 134/72  Pulse: 77    Neurologic Exam Mental Status: Awake and fully alert. Oriented to place and time. Recent and remote memory poor Attention span, concentration and fund of knowledge slightly diminished. . Mood and affect appropriate. Mini-Mental status exam score 15/30 with deficits in orientation, attention, recall, drawing. Unable to copy intersecting pentagons. Clock drawing 1/4. Cranial Nerves: Fundoscopic exam reveals sharp disc margins. Pupils equal, briskly reactive to light. Extraocular movements full without nystagmus. Visual fields show dense right homonymous hemianopsia to confrontation. Hearing intact. Facial sensation intact. Face, tongue, palate moves normally and symmetrically.  Motor: Normal bulk and tone. Normal strength in all tested extremity muscles. Sensory.: intact to tough and pinprick and vibratory.  Coordination: Rapid alternating movements normal in all extremities. Finger-to-nose and heel-to-shin performed accurately bilaterally. Gait and Station: Arises from chair without difficulty. Stance is normal. Gait demonstrates normal stride length and balance . Able to heel,  toe and tandem walk without difficulty.  Reflexes: 1+ and symmetric. Toes downgoing.   NIHSS  2 Modified Rankin  2   ASSESSMENT: 80 year African American male with left posterior cerebral artery infarct in August 2015 etiology intracranial atherosclerosis likely. He has significant residual visual field loss as well as memory loss and cognitive impairment. Multiple vascular risk factors of diabetes, hypertension, hyperlipidemia, ischemic heart disease, or the city and suspected sleep apnea    PLAN: I had a long discussion with the patient and his son regarding his recent stroke, discussed his neurological deficits on my examination findings and the need for aggressive risk factor modification and answered questions. Continue aspirin 81 mg and Plavix 75 mg daily for 3 months and then discontinue aspirin and stay on Plavix alone after that. Aggressive control of diabetes with insulin A1c goal  below 6.5%, lipids with LDL cholesterol goal below 70 mg percent, hypertension with blood pressure goal below 120/80. Polysomnogram for sleep apnea. Check vitamin B12, TSH, RPR and EEG for treatable causes of memory loss. He was  advised not to drive because of significant peripheral vision loss Return for followup in 2 months or earlier if necessary.    Note: This document was prepared with digital dictation and possible smart phrase technology. Any transcriptional errors that result from this process are unintentional.

## 2014-03-22 LAB — TSH: TSH: 1.22 u[IU]/mL (ref 0.450–4.500)

## 2014-03-22 LAB — RPR: RPR: NONREACTIVE

## 2014-03-22 LAB — VITAMIN B12: Vitamin B-12: 266 pg/mL (ref 211–946)

## 2014-03-27 ENCOUNTER — Telehealth: Payer: Self-pay | Admitting: Neurology

## 2014-03-27 NOTE — Telephone Encounter (Signed)
Dr.Sethi,refers patient for attended sleep study.  Height: 5'11.5  Weight: 242 lbs.  BMI: 33.34  Past Medical History:  Hypothyroidism  .  Mixed hyperlipidemia  .  Ventral hernia  .  Hearing loss  .  Proteinuria  Mild  .  Gout  .  Hypertrophy of prostate without urinary obstruction and other lower urinary tract symptoms (LUTS)  .  PUD (peptic ulcer disease)  .  Stroke  2008  .  Depression  .  Essential hypertension, benign  .  Type 2 diabetes mellitus  .  Coronary atherosclerosis of native coronary artery  4/12 - DES mid RCA, DES distal LAD, DES x2 mid LAD  .  Incarcerated umbilical hernia    Sleep Symptoms: hypertension, hyperlipidemia, obesity and suspected sleep apnea. He has not yet had a polysomnogram or tried CPAP   Medications:  Allopurinol (Tab) ZYLOPRIM 300 MG Take 300 mg by mouth daily. Aspirin (Tab) aspirin 81 MG Take 81 mg by mouth daily. Atorvastatin Calcium (Tab) LIPITOR 80 MG TAKE 1 TABLET BY MOUTH AT BEDTIME (PATIENT NEEDS OFFICE VISIT) Carvedilol (Tab) COREG 12.5 MG TAKE 1 TABLET BY MOUTH TWICE DAILY Citalopram Hydrobromide (Tab) CELEXA 20 MG Take 20 mg by mouth daily. Clopidogrel Bisulfate (Tab) PLAVIX 75 MG TAKE 1 TABLET BY MOUTH EVERY DAY (PATIENT NEEDS OFFICE VISIT) Colchicine (Tab) colchicine 0.6 MG Take one by mouth twice daily  Furosemide (Tab) LASIX 20 MG Take 1 tablet (20 mg total) by mouth every other day. Insulin Glargine (Solution) LANTUS 100 UNIT/ML Inject 12 Units into the skin at bedtime. Levothyroxine Sodium (Tab) SYNTHROID, LEVOTHROID 88 MCG Take 88 mcg by mouth daily before breakfast. Lisinopril (Tab) PRINIVIL,ZESTRIL 20 MG TAKE 1 TABLET BY MOUTH EVERY DAY MetFORMIN HCl (Tab) GLUCOPHAGE 1000 MG Take 1,000 mg by mouth 2 (two) times daily with a meal. Tamsulosin HCl (Cap) FLOMAX 0.4 MG Take 0.4 mg by mouth daily. TraMADol HCl (Tab) ULTRAM 50 MG Take 50 mg by mouth 2 (two) times daily as needed.    Insurance: [employer]-Medicare   Dr.  Clydene Fake Assessment and Plan:  45 year African American male with left posterior cerebral artery infarct in August 2015 etiology intracranial atherosclerosis likely. He has significant residual visual field loss as well as memory loss and cognitive impairment. Multiple vascular risk factors of diabetes, hypertension, hyperlipidemia, ischemic heart disease, or the city and suspected sleep apnea  PLAN:  I had a long discussion with the patient and his son regarding his recent stroke, discussed his neurological deficits on my examination findings and the need for aggressive risk factor modification and answered questions. Continue aspirin 81 mg and Plavix 75 mg daily for 3 months and then discontinue aspirin and stay on Plavix alone after that. Aggressive control of diabetes with insulin A1c goal below 6.5%, lipids with LDL cholesterol goal below 70 mg percent, hypertension with blood pressure goal below 120/80. Polysomnogram for sleep apnea. Check vitamin B12, TSH, RPR and EEG for treatable causes of memory loss. He was advised not to drive because of significant peripheral vision loss Return for followup in 2 months or earlier if necessary.   Please review patient information and submit instructions for scheduling and orders for sleep technologist.  Thank you!

## 2014-03-28 ENCOUNTER — Other Ambulatory Visit: Payer: Medicare Other | Admitting: Radiology

## 2014-03-28 ENCOUNTER — Other Ambulatory Visit: Payer: Self-pay | Admitting: Cardiology

## 2014-03-29 NOTE — Telephone Encounter (Signed)
Ok to schedule with me. - LL

## 2014-03-29 NOTE — Telephone Encounter (Signed)
Patient with cognitive decline and visual impairment, obesity and very remote stroke. 2008.  There is neither EDS ,nor fatigue documented, snoring or apneas being witnessed. Recommend a follow up visit with NP to address these questions and to see if sleep study is needed.   Riley Hallum., MD   PCP Judd Lien.

## 2014-04-03 ENCOUNTER — Other Ambulatory Visit (INDEPENDENT_AMBULATORY_CARE_PROVIDER_SITE_OTHER): Payer: Medicare Other

## 2014-04-03 DIAGNOSIS — I63219 Cerebral infarction due to unspecified occlusion or stenosis of unspecified vertebral arteries: Secondary | ICD-10-CM

## 2014-04-03 DIAGNOSIS — R413 Other amnesia: Secondary | ICD-10-CM

## 2014-05-01 ENCOUNTER — Other Ambulatory Visit: Payer: Self-pay | Admitting: Cardiology

## 2014-05-29 ENCOUNTER — Other Ambulatory Visit: Payer: Self-pay | Admitting: Cardiology

## 2014-06-26 ENCOUNTER — Other Ambulatory Visit: Payer: Self-pay | Admitting: Cardiology

## 2014-07-12 ENCOUNTER — Ambulatory Visit: Payer: Medicare Other | Admitting: Neurology

## 2014-07-13 ENCOUNTER — Ambulatory Visit: Payer: Medicare Other | Admitting: Neurology

## 2014-07-25 ENCOUNTER — Other Ambulatory Visit: Payer: Self-pay | Admitting: Cardiology

## 2014-08-08 NOTE — Patient Instructions (Signed)
Your procedure is scheduled on: 08/15/2014  Report to [facility] at  61  AM.  Call this number if you have problems the morning of surgery: [phone]   Do not eat food or drink liquids :After Midnight.      Take these medicines the morning of surgery with A SIP OF WATER: carvedilol(coreg), levothyroxine (synthroid), lisinipril( prinivil), tramdol(ultram). Take 1/2  Of your ususal insulin dosage the night before your surgery and no diabetic medicine the morning of your surgery.   Do not wear jewelry, make-up or nail polish.  Do not wear lotions, powders, or perfumes.   Do not shave 48 hours prior to surgery.  Do not bring valuables to the hospital.  Contacts, dentures or bridgework may not be worn into surgery.  Leave suitcase in the car. After surgery it may be brought to your room.  For patients admitted to the hospital, checkout time is 11:00 AM the day of discharge.   Patients discharged the day of surgery will not be allowed to drive home.  :     Please read over the following fact sheets that you were given: Coughing and Deep Breathing, Surgical Site Infection Prevention, Anesthesia Post-op Instructions and Care and Recovery After Surgery    Cataract A cataract is a clouding of the lens of the eye. When a lens becomes cloudy, vision is reduced based on the degree and nature of the clouding. Many cataracts reduce vision to some degree. Some cataracts make people more near-sighted as they develop. Other cataracts increase glare. Cataracts that are ignored and become worse can sometimes look white. The white color can be seen through the pupil. CAUSES   Aging. However, cataracts may occur at any age, even in newborns.   Certain drugs.   Trauma to the eye.   Certain diseases such as diabetes.   Specific eye diseases such as chronic inflammation inside the eye or a sudden attack of a rare form of glaucoma.   Inherited or acquired medical problems.  SYMPTOMS   Gradual,  progressive drop in vision in the affected eye.   Severe, rapid visual loss. This most often happens when trauma is the cause.  DIAGNOSIS  To detect a cataract, an eye doctor examines the lens. Cataracts are best diagnosed with an exam of the eyes with the pupils enlarged (dilated) by drops.  TREATMENT  For an early cataract, vision may improve by using different eyeglasses or stronger lighting. If that does not help your vision, surgery is the only effective treatment. A cataract needs to be surgically removed when vision loss interferes with your everyday activities, such as driving, reading, or watching TV. A cataract may also have to be removed if it prevents examination or treatment of another eye problem. Surgery removes the cloudy lens and usually replaces it with a substitute lens (intraocular lens, IOL).  At a time when both you and your doctor agree, the cataract will be surgically removed. If you have cataracts in both eyes, only one is usually removed at a time. This allows the operated eye to heal and be out of danger from any possible problems after surgery (such as infection or poor wound healing). In rare cases, a cataract may be doing damage to your eye. In these cases, your caregiver may advise surgical removal right away. The vast majority of people who have cataract surgery have better vision afterward. HOME CARE INSTRUCTIONS  If you are not planning surgery, you may be asked to do the  following:  Use different eyeglasses.   Use stronger or brighter lighting.   Ask your eye doctor about reducing your medicine dose or changing medicines if it is thought that a medicine caused your cataract. Changing medicines does not make the cataract go away on its own.   Become familiar with your surroundings. Poor vision can lead to injury. Avoid bumping into things on the affected side. You are at a higher risk for tripping or falling.   Exercise extreme care when driving or operating  machinery.   Wear sunglasses if you are sensitive to bright light or experiencing problems with glare.  SEEK IMMEDIATE MEDICAL CARE IF:   You have a worsening or sudden vision loss.   You notice redness, swelling, or increasing pain in the eye.   You have a fever.  Document Released: 06/23/2005 Document Revised: 06/12/2011 Document Reviewed: 02/14/2011 [facility] Patient Information 2012 [email or web address].PATIENT INSTRUCTIONS POST-ANESTHESIA  IMMEDIATELY FOLLOWING SURGERY:  Do not drive or operate machinery for the first twenty four hours after surgery.  Do not make any important decisions for twenty four hours after surgery or while taking narcotic pain medications or sedatives.  If you develop intractable nausea and vomiting or a severe headache please notify your doctor immediately.  FOLLOW-UP:  Please make an appointment with your surgeon as instructed. You do not need to follow up with anesthesia unless specifically instructed to do so.  WOUND CARE INSTRUCTIONS (if applicable):  Keep a dry clean dressing on the anesthesia/puncture wound site if there is drainage.  Once the wound has quit draining you may leave it open to air.  Generally you should leave the bandage intact for twenty four hours unless there is drainage.  If the epidural site drains for more than 36-48 hours please call the anesthesia department.  QUESTIONS?:  Please feel free to call your physician or the hospital operator if you have any questions, and they will be happy to assist you.

## 2014-08-09 ENCOUNTER — Encounter (HOSPITAL_COMMUNITY)
Admission: RE | Admit: 2014-08-09 | Discharge: 2014-08-09 | Disposition: A | Discharge status: Home / Self Care | Payer: Medicare HMO | Source: Ambulatory Visit | Attending: Ophthalmology | Admitting: Ophthalmology

## 2014-08-09 ENCOUNTER — Encounter (HOSPITAL_COMMUNITY): Payer: Self-pay

## 2014-08-09 DIAGNOSIS — H2511 Age-related nuclear cataract, right eye: Secondary | ICD-10-CM | POA: Insufficient documentation

## 2014-08-09 DIAGNOSIS — Z01818 Encounter for other preprocedural examination: Secondary | ICD-10-CM | POA: Diagnosis not present

## 2014-08-09 LAB — CBC
HEMATOCRIT: 27.7 % — AB (ref 39.0–52.0)
Hemoglobin: 8.3 g/dL — ABNORMAL LOW (ref 13.0–17.0)
MCH: 26.3 pg (ref 26.0–34.0)
MCHC: 30 g/dL (ref 30.0–36.0)
MCV: 87.9 fL (ref 78.0–100.0)
Platelets: 186 10*3/uL (ref 150–400)
RBC: 3.15 MIL/uL — ABNORMAL LOW (ref 4.22–5.81)
RDW: 18.3 % — ABNORMAL HIGH (ref 11.5–15.5)
WBC: 3.1 10*3/uL — AB (ref 4.0–10.5)

## 2014-08-09 LAB — BASIC METABOLIC PANEL
Anion gap: 8 (ref 5–15)
BUN: 26 mg/dL — ABNORMAL HIGH (ref 6–23)
CHLORIDE: 117 mmol/L — AB (ref 96–112)
CO2: 21 mmol/L (ref 19–32)
Calcium: 8.7 mg/dL (ref 8.4–10.5)
Creatinine, Ser: 1.47 mg/dL — ABNORMAL HIGH (ref 0.50–1.35)
GFR calc non Af Amer: 43 mL/min — ABNORMAL LOW (ref >90)
GFR, EST AFRICAN AMERICAN: 50 mL/min — AB (ref >90)
Glucose, Bld: 138 mg/dL — ABNORMAL HIGH (ref 70–99)
POTASSIUM: 4.7 mmol/L (ref 3.5–5.1)
SODIUM: 146 mmol/L — AB (ref 135–145)

## 2014-08-09 NOTE — Pre-Procedure Instructions (Signed)
Pt in for PAT. Heartrate on monitor was 46-49. Ekg shows SB with 1 degree block. Asymptomatic. Patient on Coreg 12.5 BID. Dr Patsey Berthold aware of all information and does not want patient to take am dose of Coreg am of surgery. Patient and family given written and verbal instructions on this and all verbalize understanding of this.

## 2014-08-09 NOTE — Pre-Procedure Instructions (Signed)
Patient given information to sign up for my chart at home. 

## 2014-08-10 NOTE — Pre-Procedure Instructions (Signed)
Dr Patsey Berthold aware of H&H, wants I-stat on arrival am of surgery.

## 2014-08-14 MED ORDER — KETOROLAC TROMETHAMINE 0.5 % OP SOLN
OPHTHALMIC | Status: AC
  Filled 2014-08-14: qty 5

## 2014-08-14 MED ORDER — PHENYLEPHRINE HCL 2.5 % OP SOLN
OPHTHALMIC | Status: AC
  Filled 2014-08-14: qty 15

## 2014-08-14 MED ORDER — TETRACAINE HCL 0.5 % OP SOLN
OPHTHALMIC | Status: AC
  Filled 2014-08-14: qty 2

## 2014-08-14 MED ORDER — CYCLOPENTOLATE-PHENYLEPHRINE OP SOLN OPTIME - NO CHARGE
OPHTHALMIC | Status: AC
  Filled 2014-08-14: qty 2

## 2014-08-15 ENCOUNTER — Encounter (HOSPITAL_COMMUNITY): Payer: Self-pay | Admitting: *Deleted

## 2014-08-15 ENCOUNTER — Ambulatory Visit (HOSPITAL_COMMUNITY): Payer: Medicare HMO | Admitting: Anesthesiology

## 2014-08-15 ENCOUNTER — Encounter (HOSPITAL_COMMUNITY)
Admission: RE | Disposition: A | Discharge status: Home / Self Care | Payer: Self-pay | Source: Ambulatory Visit | Attending: Ophthalmology

## 2014-08-15 ENCOUNTER — Ambulatory Visit (HOSPITAL_COMMUNITY)
Admission: RE | Admit: 2014-08-15 | Discharge: 2014-08-15 | Disposition: A | Discharge status: Home / Self Care | Payer: Medicare HMO | Source: Ambulatory Visit | Attending: Ophthalmology | Admitting: Ophthalmology

## 2014-08-15 DIAGNOSIS — H2511 Age-related nuclear cataract, right eye: Secondary | ICD-10-CM | POA: Insufficient documentation

## 2014-08-15 HISTORY — PX: CATARACT EXTRACTION W/PHACO: SHX586

## 2014-08-15 LAB — POCT I-STAT 4, (NA,K, GLUC, HGB,HCT)
Glucose, Bld: 86 mg/dL (ref 70–99)
HEMATOCRIT: 30 % — AB (ref 39.0–52.0)
HEMOGLOBIN: 10.2 g/dL — AB (ref 13.0–17.0)
POTASSIUM: 5.1 mmol/L (ref 3.5–5.1)
SODIUM: 147 mmol/L — AB (ref 135–145)

## 2014-08-15 SURGERY — PHACOEMULSIFICATION, CATARACT, WITH IOL INSERTION
Anesthesia: Monitor Anesthesia Care | Site: Eye | Laterality: Right

## 2014-08-15 MED ORDER — FENTANYL CITRATE 0.05 MG/ML IJ SOLN
25.0000 ug | INTRAMUSCULAR | Status: AC
  Administered 2014-08-15 (×2): 25 ug via INTRAVENOUS

## 2014-08-15 MED ORDER — MIDAZOLAM HCL 2 MG/2ML IJ SOLN
INTRAMUSCULAR | Status: AC
  Filled 2014-08-15: qty 2

## 2014-08-15 MED ORDER — BSS IO SOLN
INTRAOCULAR | Status: DC | PRN
  Administered 2014-08-15: 15 mL via INTRAOCULAR

## 2014-08-15 MED ORDER — FENTANYL CITRATE 0.05 MG/ML IJ SOLN
INTRAMUSCULAR | Status: AC
  Filled 2014-08-15: qty 2

## 2014-08-15 MED ORDER — LIDOCAINE HCL (PF) 1 % IJ SOLN
INTRAMUSCULAR | Status: AC
  Filled 2014-08-15: qty 2

## 2014-08-15 MED ORDER — DEXTROSE 50 % IV SOLN: 12.5000 g | Freq: Once | INTRAVENOUS | Status: DC

## 2014-08-15 MED ORDER — MIDAZOLAM HCL 2 MG/2ML IJ SOLN
1.0000 mg | INTRAMUSCULAR | Status: DC | PRN
Start: 2014-08-15 — End: 2014-08-15
  Administered 2014-08-15: 2 mg via INTRAVENOUS

## 2014-08-15 MED ORDER — KETOROLAC TROMETHAMINE 0.5 % OP SOLN
1.0000 [drp] | OPHTHALMIC | Status: AC
Start: 2014-08-15 — End: 2014-08-15
  Administered 2014-08-15 (×3): 1 [drp] via OPHTHALMIC

## 2014-08-15 MED ORDER — PHENYLEPHRINE HCL 2.5 % OP SOLN
1.0000 [drp] | OPHTHALMIC | Status: AC
  Administered 2014-08-15 (×3): 1 [drp] via OPHTHALMIC

## 2014-08-15 MED ORDER — TETRACAINE HCL 0.5 % OP SOLN
1.0000 [drp] | OPHTHALMIC | Status: AC
  Administered 2014-08-15 (×3): 1 [drp] via OPHTHALMIC

## 2014-08-15 MED ORDER — TETRACAINE 0.5 % OP SOLN OPTIME - NO CHARGE
OPHTHALMIC | Status: DC | PRN
  Administered 2014-08-15: 1 [drp] via OPHTHALMIC

## 2014-08-15 MED ORDER — EPINEPHRINE HCL 1 MG/ML IJ SOLN
INTRAOCULAR | Status: DC | PRN
  Administered 2014-08-15: 08:00:00

## 2014-08-15 MED ORDER — FENTANYL CITRATE 0.05 MG/ML IJ SOLN: 25.0000 ug | INTRAMUSCULAR | Status: DC | PRN

## 2014-08-15 MED ORDER — PROVISC 10 MG/ML IO SOLN
INTRAOCULAR | Status: DC | PRN
  Administered 2014-08-15: 0.85 mL via INTRAOCULAR

## 2014-08-15 MED ORDER — LACTATED RINGERS IV SOLN
INTRAVENOUS | Status: DC | PRN
  Administered 2014-08-15: 07:00:00 via INTRAVENOUS

## 2014-08-15 MED ORDER — ONDANSETRON HCL 4 MG/2ML IJ SOLN: 4.0000 mg | Freq: Once | INTRAMUSCULAR | Status: DC | PRN

## 2014-08-15 MED ORDER — EPINEPHRINE HCL 1 MG/ML IJ SOLN
INTRAMUSCULAR | Status: AC
  Filled 2014-08-15: qty 1

## 2014-08-15 MED ORDER — DEXTROSE 50 % IV SOLN
12.5000 g | Freq: Once | INTRAVENOUS | Status: AC
  Administered 2014-08-15: 12.5 g via INTRAVENOUS

## 2014-08-15 MED ORDER — CYCLOPENTOLATE-PHENYLEPHRINE 0.2-1 % OP SOLN
1.0000 [drp] | OPHTHALMIC | Status: AC
Start: 2014-08-15 — End: 2014-08-15
  Administered 2014-08-15 (×3): 1 [drp] via OPHTHALMIC

## 2014-08-15 MED ORDER — FENTANYL CITRATE 0.05 MG/ML IJ SOLN
INTRAMUSCULAR | Status: AC
Start: 2014-08-15 — End: 2014-08-15
  Filled 2014-08-15: qty 2

## 2014-08-15 MED ORDER — LACTATED RINGERS IV SOLN
INTRAVENOUS | Status: DC
  Administered 2014-08-15: 07:00:00 via INTRAVENOUS

## 2014-08-15 MED ORDER — DEXTROSE 50 % IV SOLN
INTRAVENOUS | Status: AC
  Filled 2014-08-15: qty 50

## 2014-08-15 SURGICAL SUPPLY — 9 items
CLOTH BEACON ORANGE TIMEOUT ST (SAFETY) ×2 IMPLANT
EYE SHIELD UNIVERSAL CLEAR (GAUZE/BANDAGES/DRESSINGS) ×2 IMPLANT
GLOVE BIOGEL PI IND STRL 6.5 (GLOVE) ×2 IMPLANT
GLOVE BIOGEL PI INDICATOR 6.5 (GLOVE) ×2
GLOVE EXAM NITRILE MD LF STRL (GLOVE) ×2 IMPLANT
LENS ALC ACRYL/TECN (Ophthalmic Related) ×2 IMPLANT
PAD ARMBOARD 7.5X6 YLW CONV (MISCELLANEOUS) ×2 IMPLANT
TAPE TRANSPORE STRL 2 31045 (GAUZE/BANDAGES/DRESSINGS) ×2 IMPLANT
WATER STERILE IRR 250ML POUR (IV SOLUTION) ×2 IMPLANT

## 2014-08-15 NOTE — Transfer of Care (Signed)
Immediate Anesthesia Transfer of Care Note  Patient: Paul Wood.  Procedure(s) Performed: Procedure(s) with comments: CATARACT EXTRACTION PHACO AND INTRAOCULAR LENS PLACEMENT (IOC) (Right) - CDE:10.77  Patient Location: Short Stay  Anesthesia Type:MAC  Level of Consciousness: awake, alert , oriented and patient cooperative  Airway & Oxygen Therapy: Patient Spontanous Breathing  Post-op Assessment: Report given to RN and Post -op Vital signs reviewed and stable  Post vital signs: Reviewed and stable  Last Vitals:  Filed Vitals:   08/15/14 0720  BP: 162/77  Temp:   Resp: 13    Complications: No apparent anesthesia complications

## 2014-08-15 NOTE — Anesthesia Postprocedure Evaluation (Signed)
  Anesthesia Post-op Note  Patient: Paul Wood.  Procedure(s) Performed: Procedure(s) with comments: CATARACT EXTRACTION PHACO AND INTRAOCULAR LENS PLACEMENT (IOC) (Right) - CDE:10.77  Patient Location: Short Stay  Anesthesia Type:MAC  Level of Consciousness: awake, alert , oriented and patient cooperative  Airway and Oxygen Therapy: Patient Spontanous Breathing  Post-op Pain: none  Post-op Assessment: Post-op Vital signs reviewed, Patient's Cardiovascular Status Stable, Respiratory Function Stable, Patent Airway, No signs of Nausea or vomiting and Pain level controlled  Post-op Vital Signs: Reviewed and stable  Last Vitals:  Filed Vitals:   08/15/14 0720  BP: 162/77  Temp:   Resp: 13    Complications: No apparent anesthesia complications

## 2014-08-15 NOTE — Op Note (Signed)
Patient brought to the operating room and prepped and draped in the usual manner.  Lid speculum inserted in right eye.  Stab incision made at the twelve o'clock position.  Provisc instilled in the anterior chamber.   A 2.4 mm. Stab incision was made temporally.  An anterior capsulotomy was done with a bent 25 gauge needle.  The nucleus was hydrodissected.  The Phaco tip was inserted in the anterior chamber and the nucleus was emulsified.  CDE was 10.77.  The cortical material was then removed with the I and A tip.  Posterior capsule was the polished.  The anterior chamber was deepened with Provisc.  A 19.5 Alcon [id number] IOL was then inserted in the capsular bag.  Provisc was then removed with the I and A tip.  The wound was then hydrated.  Patient sent to the Recovery Room in good condition with follow up in my office.  Preoperative Diagnosis:  Nuclear Cataract OD with IOL Postoperative Diagnosis:  Same Procedure name: Kelman Phacoemulsification OD with IOL

## 2014-08-15 NOTE — Discharge Instructions (Signed)
Paul Wood.  08/15/2014           [place] Instructions [place] [id number] [address]      1. Avoid closing eyes tightly. One often closes the eye tightly when laughing, talking, sneezing, coughing or if they feel irritated. At these times, you should be careful not to close your eyes tightly.  2. Instill eye drops as instructed. To instill drops in your eye, open it, look up and have someone gently pull the lower lid down and instill a couple of drops inside the lower lid.  3. Do not touch upper lid.  4. Take Advil or Tylenol for pain.  5. You may use either eye for near work, such as reading or sewing and you may watch television.  6. You may have your hair done at the beauty parlor at any time.  7. Wear dark glasses with or without your own glasses if you are in bright light.  8. Call our office at [phone] or [phone] if you have sharp pain in your eye or unusual symptoms.  9. Do not be concerned because vision in the operative eye is not good. It will not be good, no matter how successful the operation, until you get a special lens for it. Your old glasses will not be suited to the new eye that was operated on and you will not be ready for a new lens for about a month.  10. Follow up at the [facility] office.    I have received a copy of the above instructions and will follow them.   PATIENT INSTRUCTIONS POST-ANESTHESIA  IMMEDIATELY FOLLOWING SURGERY:  Do not drive or operate machinery for the first twenty four hours after surgery.  Do not make any important decisions for twenty four hours after surgery or while taking narcotic pain medications or sedatives.  If you develop intractable nausea and vomiting or a severe headache please notify your doctor immediately.  FOLLOW-UP:  Please make an appointment with your surgeon as instructed. You do not need to follow up with anesthesia unless specifically instructed to do  so.  WOUND CARE INSTRUCTIONS (if applicable):  Keep a dry clean dressing on the anesthesia/puncture wound site if there is drainage.  Once the wound has quit draining you may leave it open to air.  Generally you should leave the bandage intact for twenty four hours unless there is drainage.  If the epidural site drains for more than 36-48 hours please call the anesthesia department.  QUESTIONS?:  Please feel free to call your physician or the hospital operator if you have any questions, and they will be happy to assist you.

## 2014-08-15 NOTE — H&P (Signed)
The patient was re examined and there is no change in the patients condition since the original H and P. 

## 2014-08-15 NOTE — Anesthesia Procedure Notes (Signed)
Procedure Name: MAC Date/Time: 08/15/2014 7:28 AM Performed by: Andree Elk, AMY A Pre-anesthesia Checklist: Patient identified, Timeout performed, Emergency Drugs available, Suction available and Patient being monitored Oxygen Delivery Method: Nasal cannula

## 2014-08-15 NOTE — Anesthesia Preprocedure Evaluation (Signed)
Anesthesia Evaluation  Patient identified by MRN, date of birth, ID band Patient awake    Reviewed: Allergy & Precautions, NPO status , Patient's Chart, lab work & pertinent test results, reviewed documented beta blocker date and time   Airway Mallampati: II  TM Distance: >3 FB Neck ROM: Full    Dental  (+) Partial Upper   Pulmonary shortness of breath and with exertion,  breath sounds clear to auscultation        Cardiovascular hypertension, Pt. on medications and Pt. on home beta blockers + CAD Rhythm:Regular Rate:Normal     Neuro/Psych PSYCHIATRIC DISORDERS Depression CVA, Residual Symptoms    GI/Hepatic PUD,   Endo/Other  diabetes, Poorly Controlled, Type 2, Oral Hypoglycemic AgentsHypothyroidism   Renal/GU Renal InsufficiencyRenal disease     Musculoskeletal   Abdominal   Peds  Hematology  (+) Blood dyscrasia, anemia ,   Anesthesia Other Findings   Reproductive/Obstetrics                             Anesthesia Physical Anesthesia Plan  ASA: III  Anesthesia Plan: MAC   Post-op Pain Management:    Induction: Intravenous  Airway Management Planned: Nasal Cannula  Additional Equipment:   Intra-op Plan:   Post-operative Plan:   Informed Consent: I have reviewed the patients History and Physical, chart, labs and discussed the procedure including the risks, benefits and alternatives for the proposed anesthesia with the patient or authorized representative who has indicated his/her understanding and acceptance.     Plan Discussed with:   Anesthesia Plan Comments:         Anesthesia Quick Evaluation

## 2014-08-16 ENCOUNTER — Encounter (HOSPITAL_COMMUNITY): Payer: Self-pay | Admitting: Ophthalmology

## 2014-08-16 LAB — GLUCOSE, CAPILLARY: GLUCOSE-CAPILLARY: 81 mg/dL (ref 70–99)

## 2014-09-06 ENCOUNTER — Other Ambulatory Visit: Payer: Self-pay | Admitting: Cardiology

## 2015-02-08 ENCOUNTER — Emergency Department (HOSPITAL_COMMUNITY): Payer: Medicare HMO

## 2015-02-08 ENCOUNTER — Observation Stay (HOSPITAL_COMMUNITY)
Admission: EM | Admit: 2015-02-08 | Discharge: 2015-02-09 | Disposition: A | Discharge status: Home / Self Care | Payer: Medicare HMO | Attending: Family Medicine | Admitting: Family Medicine

## 2015-02-08 ENCOUNTER — Encounter (HOSPITAL_COMMUNITY): Payer: Self-pay | Admitting: Emergency Medicine

## 2015-02-08 DIAGNOSIS — Z8673 Personal history of transient ischemic attack (TIA), and cerebral infarction without residual deficits: Secondary | ICD-10-CM | POA: Diagnosis not present

## 2015-02-08 DIAGNOSIS — N289 Disorder of kidney and ureter, unspecified: Secondary | ICD-10-CM | POA: Diagnosis not present

## 2015-02-08 DIAGNOSIS — E782 Mixed hyperlipidemia: Secondary | ICD-10-CM | POA: Diagnosis not present

## 2015-02-08 DIAGNOSIS — E1122 Type 2 diabetes mellitus with diabetic chronic kidney disease: Secondary | ICD-10-CM | POA: Diagnosis present

## 2015-02-08 DIAGNOSIS — D649 Anemia, unspecified: Secondary | ICD-10-CM

## 2015-02-08 DIAGNOSIS — E119 Type 2 diabetes mellitus without complications: Secondary | ICD-10-CM | POA: Insufficient documentation

## 2015-02-08 DIAGNOSIS — Z79899 Other long term (current) drug therapy: Secondary | ICD-10-CM | POA: Insufficient documentation

## 2015-02-08 DIAGNOSIS — N183 Chronic kidney disease, stage 3 unspecified: Secondary | ICD-10-CM

## 2015-02-08 DIAGNOSIS — K439 Ventral hernia without obstruction or gangrene: Secondary | ICD-10-CM | POA: Insufficient documentation

## 2015-02-08 DIAGNOSIS — Z794 Long term (current) use of insulin: Secondary | ICD-10-CM | POA: Diagnosis not present

## 2015-02-08 DIAGNOSIS — N400 Enlarged prostate without lower urinary tract symptoms: Secondary | ICD-10-CM | POA: Insufficient documentation

## 2015-02-08 DIAGNOSIS — R4182 Altered mental status, unspecified: Secondary | ICD-10-CM | POA: Diagnosis present

## 2015-02-08 DIAGNOSIS — H9190 Unspecified hearing loss, unspecified ear: Secondary | ICD-10-CM | POA: Insufficient documentation

## 2015-02-08 DIAGNOSIS — Z7982 Long term (current) use of aspirin: Secondary | ICD-10-CM | POA: Insufficient documentation

## 2015-02-08 DIAGNOSIS — I10 Essential (primary) hypertension: Secondary | ICD-10-CM | POA: Insufficient documentation

## 2015-02-08 DIAGNOSIS — M109 Gout, unspecified: Secondary | ICD-10-CM | POA: Insufficient documentation

## 2015-02-08 DIAGNOSIS — E039 Hypothyroidism, unspecified: Secondary | ICD-10-CM | POA: Insufficient documentation

## 2015-02-08 DIAGNOSIS — I2510 Atherosclerotic heart disease of native coronary artery without angina pectoris: Secondary | ICD-10-CM | POA: Diagnosis not present

## 2015-02-08 DIAGNOSIS — E118 Type 2 diabetes mellitus with unspecified complications: Secondary | ICD-10-CM

## 2015-02-08 DIAGNOSIS — G459 Transient cerebral ischemic attack, unspecified: Secondary | ICD-10-CM

## 2015-02-08 DIAGNOSIS — K219 Gastro-esophageal reflux disease without esophagitis: Secondary | ICD-10-CM

## 2015-02-08 DIAGNOSIS — Z7902 Long term (current) use of antithrombotics/antiplatelets: Secondary | ICD-10-CM | POA: Insufficient documentation

## 2015-02-08 DIAGNOSIS — K279 Peptic ulcer, site unspecified, unspecified as acute or chronic, without hemorrhage or perforation: Secondary | ICD-10-CM | POA: Insufficient documentation

## 2015-02-08 DIAGNOSIS — F329 Major depressive disorder, single episode, unspecified: Secondary | ICD-10-CM | POA: Insufficient documentation

## 2015-02-08 DIAGNOSIS — I639 Cerebral infarction, unspecified: Secondary | ICD-10-CM

## 2015-02-08 DIAGNOSIS — K420 Umbilical hernia with obstruction, without gangrene: Secondary | ICD-10-CM | POA: Insufficient documentation

## 2015-02-08 DIAGNOSIS — E785 Hyperlipidemia, unspecified: Secondary | ICD-10-CM

## 2015-02-08 HISTORY — DX: Chronic kidney disease, stage 3 unspecified: N18.30

## 2015-02-08 HISTORY — DX: Transient cerebral ischemic attack, unspecified: G45.9

## 2015-02-08 HISTORY — DX: Benign prostatic hyperplasia without lower urinary tract symptoms: N40.0

## 2015-02-08 HISTORY — DX: Gastro-esophageal reflux disease without esophagitis: K21.9

## 2015-02-08 LAB — CBC
HCT: 28.2 % — ABNORMAL LOW (ref 39.0–52.0)
HEMOGLOBIN: 8.5 g/dL — AB (ref 13.0–17.0)
MCH: 23.7 pg — AB (ref 26.0–34.0)
MCHC: 30.1 g/dL (ref 30.0–36.0)
MCV: 78.6 fL (ref 78.0–100.0)
Platelets: 184 10*3/uL (ref 150–400)
RBC: 3.59 MIL/uL — ABNORMAL LOW (ref 4.22–5.81)
RDW: 20.5 % — AB (ref 11.5–15.5)
WBC: 4.7 10*3/uL (ref 4.0–10.5)

## 2015-02-08 LAB — RAPID URINE DRUG SCREEN, HOSP PERFORMED
Amphetamines: NOT DETECTED
BENZODIAZEPINES: NOT DETECTED
Barbiturates: NOT DETECTED
Cocaine: NOT DETECTED
Opiates: NOT DETECTED
TETRAHYDROCANNABINOL: NOT DETECTED

## 2015-02-08 LAB — URINALYSIS, ROUTINE W REFLEX MICROSCOPIC
Bilirubin Urine: NEGATIVE
GLUCOSE, UA: NEGATIVE mg/dL
Hgb urine dipstick: NEGATIVE
KETONES UR: NEGATIVE mg/dL
LEUKOCYTES UA: NEGATIVE
NITRITE: NEGATIVE
Protein, ur: 30 mg/dL — AB
Urobilinogen, UA: 0.2 mg/dL (ref 0.0–1.0)
pH: 6 (ref 5.0–8.0)

## 2015-02-08 LAB — DIFFERENTIAL
BASOS PCT: 0 % (ref 0–1)
Basophils Absolute: 0 10*3/uL (ref 0.0–0.1)
EOS ABS: 0.1 10*3/uL (ref 0.0–0.7)
Eosinophils Relative: 3 % (ref 0–5)
LYMPHS PCT: 36 % (ref 12–46)
Lymphs Abs: 1.7 10*3/uL (ref 0.7–4.0)
MONO ABS: 0.4 10*3/uL (ref 0.1–1.0)
MONOS PCT: 7 % (ref 3–12)
NEUTROS ABS: 2.5 10*3/uL (ref 1.7–7.7)
NEUTROS PCT: 53 % (ref 43–77)

## 2015-02-08 LAB — COMPREHENSIVE METABOLIC PANEL
ALK PHOS: 76 U/L (ref 38–126)
ALT: 14 U/L — AB (ref 17–63)
ANION GAP: 7 (ref 5–15)
AST: 21 U/L (ref 15–41)
Albumin: 3.7 g/dL (ref 3.5–5.0)
BILIRUBIN TOTAL: 0.4 mg/dL (ref 0.3–1.2)
BUN: 27 mg/dL — AB (ref 6–20)
CALCIUM: 8.3 mg/dL — AB (ref 8.9–10.3)
CO2: 25 mmol/L (ref 22–32)
Chloride: 110 mmol/L (ref 101–111)
Creatinine, Ser: 1.71 mg/dL — ABNORMAL HIGH (ref 0.61–1.24)
GFR calc Af Amer: 41 mL/min — ABNORMAL LOW (ref >60)
GFR calc non Af Amer: 36 mL/min — ABNORMAL LOW (ref >60)
GLUCOSE: 97 mg/dL (ref 65–99)
Potassium: 4.8 mmol/L (ref 3.5–5.1)
SODIUM: 142 mmol/L (ref 135–145)
Total Protein: 6.6 g/dL (ref 6.5–8.1)

## 2015-02-08 LAB — URINE MICROSCOPIC-ADD ON

## 2015-02-08 LAB — ETHANOL

## 2015-02-08 LAB — PROTIME-INR
INR: 1.06 (ref 0.00–1.49)
PROTHROMBIN TIME: 14 s (ref 11.6–15.2)

## 2015-02-08 LAB — GLUCOSE, CAPILLARY: Glucose-Capillary: 73 mg/dL (ref 65–99)

## 2015-02-08 LAB — TROPONIN I: Troponin I: <0.03 ng/mL (ref <0.031)

## 2015-02-08 LAB — APTT: aPTT: 29 seconds (ref 24–37)

## 2015-02-08 MED ORDER — ASPIRIN EC 81 MG PO TBEC
81.0000 mg | DELAYED_RELEASE_TABLET | Freq: Every day | ORAL | Status: DC
  Administered 2015-02-09: 81 mg via ORAL
  Filled 2015-02-08: qty 1

## 2015-02-08 MED ORDER — PANTOPRAZOLE SODIUM 40 MG PO TBEC
40.0000 mg | DELAYED_RELEASE_TABLET | Freq: Two times a day (BID) | ORAL | Status: DC
  Administered 2015-02-09 (×2): 40 mg via ORAL
  Filled 2015-02-08 (×2): qty 1

## 2015-02-08 MED ORDER — HYDRALAZINE HCL 20 MG/ML IJ SOLN
5.0000 mg | INTRAMUSCULAR | Status: DC | PRN
  Administered 2015-02-09: 5 mg via INTRAVENOUS
  Filled 2015-02-08: qty 1

## 2015-02-08 MED ORDER — SODIUM CHLORIDE 0.9 % IV SOLN: INTRAVENOUS | Status: DC

## 2015-02-08 MED ORDER — INSULIN ASPART 100 UNIT/ML ~~LOC~~ SOLN
0.0000 [IU] | Freq: Three times a day (TID) | SUBCUTANEOUS | Status: DC
  Administered 2015-02-09: 2 [IU] via SUBCUTANEOUS

## 2015-02-08 MED ORDER — STROKE: EARLY STAGES OF RECOVERY BOOK
Freq: Once | Status: DC
  Filled 2015-02-08: qty 1

## 2015-02-08 MED ORDER — CLOPIDOGREL BISULFATE 75 MG PO TABS
75.0000 mg | ORAL_TABLET | Freq: Every day | ORAL | Status: DC
  Administered 2015-02-09: 75 mg via ORAL
  Filled 2015-02-08: qty 1

## 2015-02-08 MED ORDER — ONDANSETRON HCL 4 MG/2ML IJ SOLN: 4.0000 mg | Freq: Four times a day (QID) | INTRAMUSCULAR | Status: DC | PRN

## 2015-02-08 MED ORDER — ATORVASTATIN CALCIUM 40 MG PO TABS
80.0000 mg | ORAL_TABLET | Freq: Every day | ORAL | Status: DC
  Administered 2015-02-09: 80 mg via ORAL
  Filled 2015-02-08: qty 2

## 2015-02-08 MED ORDER — HEPARIN SODIUM (PORCINE) 5000 UNIT/ML IJ SOLN
5000.0000 [IU] | Freq: Three times a day (TID) | INTRAMUSCULAR | Status: DC
  Administered 2015-02-09 (×2): 5000 [IU] via SUBCUTANEOUS
  Filled 2015-02-08 (×2): qty 1

## 2015-02-08 MED ORDER — TAMSULOSIN HCL 0.4 MG PO CAPS
0.4000 mg | ORAL_CAPSULE | Freq: Every day | ORAL | Status: DC
  Administered 2015-02-09: 0.4 mg via ORAL
  Filled 2015-02-08: qty 1

## 2015-02-08 MED ORDER — LEVOTHYROXINE SODIUM 88 MCG PO TABS
88.0000 ug | ORAL_TABLET | Freq: Every day | ORAL | Status: DC
  Administered 2015-02-09: 88 ug via ORAL
  Filled 2015-02-08: qty 1

## 2015-02-08 MED ORDER — SODIUM CHLORIDE 0.45 % IV SOLN
INTRAVENOUS | Status: DC
  Administered 2015-02-09: via INTRAVENOUS

## 2015-02-08 MED ORDER — SODIUM CHLORIDE 0.9 % IJ SOLN
3.0000 mL | Freq: Two times a day (BID) | INTRAMUSCULAR | Status: DC
  Administered 2015-02-09: 3 mL via INTRAVENOUS

## 2015-02-08 MED ORDER — ONDANSETRON HCL 4 MG PO TABS: 4.0000 mg | ORAL_TABLET | Freq: Four times a day (QID) | ORAL | Status: DC | PRN

## 2015-02-08 MED ORDER — INSULIN GLARGINE 100 UNIT/ML ~~LOC~~ SOLN
14.0000 [IU] | Freq: Every day | SUBCUTANEOUS | Status: DC
  Administered 2015-02-09: 14 [IU] via SUBCUTANEOUS
  Filled 2015-02-08 (×4): qty 0.14

## 2015-02-08 MED ORDER — ACETAMINOPHEN 325 MG PO TABS: 650.0000 mg | ORAL_TABLET | Freq: Four times a day (QID) | ORAL | Status: DC | PRN

## 2015-02-08 MED ORDER — ALLOPURINOL 300 MG PO TABS
300.0000 mg | ORAL_TABLET | Freq: Every day | ORAL | Status: DC
  Administered 2015-02-09: 300 mg via ORAL
  Filled 2015-02-08: qty 1

## 2015-02-08 MED ORDER — ACETAMINOPHEN 650 MG RE SUPP: 650.0000 mg | Freq: Four times a day (QID) | RECTAL | Status: DC | PRN
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
## 2015-02-09 ENCOUNTER — Observation Stay (HOSPITAL_BASED_OUTPATIENT_CLINIC_OR_DEPARTMENT_OTHER): Payer: Medicare HMO

## 2015-02-09 ENCOUNTER — Observation Stay (HOSPITAL_COMMUNITY): Payer: Medicare HMO

## 2015-02-09 DIAGNOSIS — G459 Transient cerebral ischemic attack, unspecified: Secondary | ICD-10-CM

## 2015-02-09 DIAGNOSIS — N183 Chronic kidney disease, stage 3 (moderate): Secondary | ICD-10-CM

## 2015-02-09 DIAGNOSIS — I6350 Cerebral infarction due to unspecified occlusion or stenosis of unspecified cerebral artery: Secondary | ICD-10-CM | POA: Diagnosis not present

## 2015-02-09 DIAGNOSIS — I10 Essential (primary) hypertension: Secondary | ICD-10-CM | POA: Diagnosis not present

## 2015-02-09 LAB — GLUCOSE, CAPILLARY
GLUCOSE-CAPILLARY: 68 mg/dL (ref 65–99)
Glucose-Capillary: 97 mg/dL (ref 65–99)
Glucose-Capillary: 93 mg/dL (ref 65–99)
Glucose-Capillary: 180 mg/dL — ABNORMAL HIGH (ref 65–99)

## 2015-02-09 LAB — CBC
HCT: 27.3 % — ABNORMAL LOW (ref 39.0–52.0)
Hemoglobin: 8.4 g/dL — ABNORMAL LOW (ref 13.0–17.0)
MCH: 23.9 pg — ABNORMAL LOW (ref 26.0–34.0)
MCHC: 30.8 g/dL (ref 30.0–36.0)
MCV: 77.8 fL — ABNORMAL LOW (ref 78.0–100.0)
Platelets: 204 10*3/uL (ref 150–400)
RBC: 3.51 MIL/uL — AB (ref 4.22–5.81)
RDW: 20.6 % — ABNORMAL HIGH (ref 11.5–15.5)
WBC: 3.5 10*3/uL — ABNORMAL LOW (ref 4.0–10.5)

## 2015-02-09 LAB — BASIC METABOLIC PANEL
Anion gap: 8 (ref 5–15)
BUN: 24 mg/dL — AB (ref 6–20)
CALCIUM: 8.3 mg/dL — AB (ref 8.9–10.3)
CO2: 24 mmol/L (ref 22–32)
CREATININE: 1.35 mg/dL — AB (ref 0.61–1.24)
Chloride: 110 mmol/L (ref 101–111)
GFR calc non Af Amer: 48 mL/min — ABNORMAL LOW (ref >60)
GFR, EST AFRICAN AMERICAN: 55 mL/min — AB (ref >60)
Glucose, Bld: 105 mg/dL — ABNORMAL HIGH (ref 65–99)
POTASSIUM: 3.7 mmol/L (ref 3.5–5.1)
Sodium: 142 mmol/L (ref 135–145)
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
## 2015-02-09 NOTE — Progress Notes (Signed)
PROGRESS NOTE  Paul Wood. [id number] DOB: 30-Jul-1931 DOA: 02/08/2015 PCP: [name], MD  Summary: 59 yom with a history of stroke, presented with slurred speech and stumbling. ED workup was unremarkable, admitted for further evaluation and imaging.   Assessment/Plan:  1. Transient Ischemic Attack, symptoms resolved, continue plavix. Carotid US without significant stenosis. ECHO pending. Lipid panel was not checked.   2. DM. Stable.  3. HTN, stable. Resume antihypertensives tomorrow 4. CKD (chronic kidney disease), stage III. Appears to be baseline 5. Chronic normocytic anemia, likely related to CKD and DM. Appears to be at baseline    Overall much improved, asymptomatic, mri negative  Continue Plavix, add low dose ASA (was not taking ASA at home)  Plan discharge home. Follow up with PCP within one week  F/u PT recommendations.  Suggest follow up with a neurologist within 2 months, no neurologist at [facility] right now  Check lipid panel as outpatient, continue statin  Follow up ECHO which is pending  Code Status: Full  DVT prophylaxis: Heparin  Family Communication: son was at bedside. Care plan was discussed and understood. There were no questions at this time Disposition Plan: discharge home today   Murray Hodgkins, MD  [facility]  Pager [phone] If 7PM-7AM, please contact night-coverage at [email or web address], password [facility] 02/09/2015, 1:26 PM    Consultants:  Occupational therapy - no follow up   Procedures:    Antibiotics:    HPI/Subjective: Feeling better and reports ready to go home.  No weakness, trouble speaking or walking. No difficulties with coordination. No n/v. CP, SOB.  No difficulty eating.   Objective: Filed Vitals:   02/09/15 0630 02/09/15 0830 02/09/15 1030 02/09/15 1100  BP: 163/57 172/63 159/59 176/61  Pulse: 72 59 60 53  Temp: 97.9 F (36.6 C) 97.6 F (36.4 C) 97.6 F (36.4 C) 97.5 F (36.4 C)  TempSrc: Oral Oral Oral  Oral  Resp: 18   18  Height:      Weight:      SpO2: 100%  100% 100%   No intake or output data in the 24 hours ending 02/09/15 1326   Filed Weights   02/08/15 1814 02/08/15 2238  Weight: 117.935 kg (260 lb) 118.1 kg (260 lb 5.8 oz)    Exam:    SBP 160s otherwise VSS, afebrile   General:  Appears calm and comfortable, lying in bed  Eyes: PERRL, normal lids, irises & conjunctiva  ENT: grossly normal hearing, lips & tongue  Neck: no LAD, masses or thyromegaly  Cardiovascular: RRR, no m/r/g.  left greater than right LE edema 2+  Telemetry: SR/SB  Respiratory: CTA bilaterally, no w/r/r. Normal respiratory effort.  Musculoskeletal: grossly normal tone BUE/BLE  Psychiatric: grossly normal mood and affect, speech fluent and appropriate  Neurologic: grossly non-focal. Cranial nerves intact, no pronator drift.  No pass pointing. Negative Romberg. Oriented to month, person, place, president and year   New data reviewed:  blood sugar stable  creatinine much improved, 1.35 baseline consistent with CKD 3  HGB stable 8.4, at baseline  Pertinent data since admission:  UA Negative  Urine drug screen negative   carotid US without stenosis  MRI of brain no acute infarct  CXR no acute disease   EKG sinus rhythm, 1st AVB multiple PVC  Pending data:  Echo   Scheduled Meds: .  stroke: mapping our early stages of recovery book   Does not apply Once  . sodium chloride   Intravenous STAT  . allopurinol  300 mg Oral Daily  . aspirin EC  81 mg Oral Daily  . atorvastatin  80 mg Oral q1800  . clopidogrel  75 mg Oral Daily  . heparin  5,000 Units Subcutaneous 3 times per day  . insulin aspart  0-9 Units Subcutaneous TID WC  . insulin glargine  14 Units Subcutaneous QHS  . levothyroxine  88 mcg Oral QAC breakfast  . pantoprazole  40 mg Oral BID  . sodium chloride  3 mL Intravenous Q12H  . tamsulosin  0.4 mg Oral Daily   Continuous Infusions: . sodium chloride 50 mL/hr  at 02/09/15 0006    Principal Problem:   TIA (transient ischemic attack) Active Problems:   Diabetes mellitus with complication   HLD (hyperlipidemia)   Essential hypertension, benign   Coronary atherosclerosis of native coronary artery   CKD (chronic kidney disease), stage III   GERD without esophagitis   BPH (benign prostatic hyperplasia)      I, Arielle Khosrowpour, acting a scribe, recorded this note contemporaneously in the presence of Dr. Melene Plan. Sarajane Jews, M.D. on 02/09/2015   I have reviewed the above documentation for accuracy and completeness, and I agree with the above. Murray Hodgkins, MD
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
## 2015-02-09 NOTE — Progress Notes (Signed)
SLP Cancellation Note  Patient Details Name: Paul Wood. MRN: [id number] DOB: 09/18/1931   Cancelled treatment:       Reason Eval/Treat Not Completed: SLP screened, no needs identified, will sign off. No acute findings on MRI. Spoke with pt briefly; pt is oriented x4, responding to questions appropriately, 100% intelligible, able to recall recommendations from PT/ OT. Will sign off; please re-consult if needs arise.    Kern Reap, MA, CCC-SLP 02/09/2015, 3:05 PM

## 2015-02-09 NOTE — Progress Notes (Signed)
Inpatient Diabetes Program Recommendations  AACE/ADA: New Consensus Statement on Inpatient Glycemic Control (2013)  Target Ranges:  Prepandial:   less than 140 mg/dL      Peak postprandial:   less than 180 mg/dL (1-2 hours)      Critically ill patients:  140 - 180 mg/dL   Results for KUNAAL, MALAVE (MRN [id number]) as of 02/09/2015 10:11  Ref. Range 02/08/2015 22:36 02/09/2015 07:36 02/09/2015 08:06  Glucose-Capillary Latest Ref Range: 65-99 mg/dL 73 68 97    Diabetes history: DM2 Outpatient Diabetes medications: Metformin 1000 mg BID, Lantus 14 units QHS Current orders for Inpatient glycemic control: Lantus 14 units QHS, Novolog 0-9 units TID with meals  Inpatient Diabetes Program Recommendations Insulin - Basal: Fasting glucose 68 mg/dl this morning. Please consider decreasing Lantus to 12 units QHS. Diet: Please consider changing diet from Regular to Carb Modified diet.  Thanks, Barnie Alderman, RN, MSN, CCRN, CDE Diabetes Coordinator Inpatient Diabetes Program [phone] (Team Pager from [place] to [place]) [phone] ([facility] office) [phone] [facility] office) [phone] [facility] office)

## 2015-02-09 NOTE — Care Management Note (Signed)
Case Management Note  Patient Details  Name: Paul Wood. MRN: [id number] Date of Birth: Aug 29, 1931  Subjective/Objective:                  Pt admitted from home with TIA. Pt lives with his wife and son. Pt uses a cane prn but is fairly independent with ADL's.  Action/Plan: Pt for discharge home today. No CM needs noted.  Expected Discharge Date:                  Expected Discharge Plan:  Home/Self Care  In-House Referral:  NA  Discharge planning Services  CM Consult  Post Acute Care Choice:  NA Choice offered to:  NA  DME Arranged:    DME Agency:     HH Arranged:    HH Agency:     Status of Service:  Completed, signed off  Medicare Important Message Given:    Date Medicare IM Given:    Medicare IM give by:    Date Additional Medicare IM Given:    Additional Medicare Important Message give by:     If discussed at [place] of Stay Meetings, dates discussed:    Additional Comments:  Joylene Draft, RN 02/09/2015, 3:04 PM

## 2015-04-10 DIAGNOSIS — Z961 Presence of intraocular lens: Secondary | ICD-10-CM | POA: Diagnosis not present

## 2015-05-07 ENCOUNTER — Encounter: Payer: Self-pay | Admitting: Cardiology

## 2015-05-07 ENCOUNTER — Ambulatory Visit (INDEPENDENT_AMBULATORY_CARE_PROVIDER_SITE_OTHER): Payer: Medicare HMO | Admitting: Cardiology

## 2015-05-07 VITALS — BP 137/61 | HR 54 | Ht 74.0 in | Wt 261.0 lb

## 2015-05-07 DIAGNOSIS — I2510 Atherosclerotic heart disease of native coronary artery without angina pectoris: Secondary | ICD-10-CM

## 2015-05-07 DIAGNOSIS — Z23 Encounter for immunization: Secondary | ICD-10-CM

## 2015-05-07 DIAGNOSIS — I10 Essential (primary) hypertension: Secondary | ICD-10-CM

## 2015-05-07 DIAGNOSIS — Z8673 Personal history of transient ischemic attack (TIA), and cerebral infarction without residual deficits: Secondary | ICD-10-CM | POA: Diagnosis not present

## 2015-05-07 NOTE — Patient Instructions (Signed)
Your physician recommends that you continue on your current medications as directed. Please refer to the Current Medication list given to you today. Your physician recommends that you schedule a follow-up appointment in: 6 months. You will receive a reminder letter in the mail in about 4 months reminding you to call and schedule your appointment. If you don't receive this letter, please contact our office. 

## 2015-05-07 NOTE — Progress Notes (Signed)
Cardiology Office Note  Date: 05/07/2015   ID: Paul Wood., DOB [birth year], MRN [id number]  PCP: [name], MD  Primary Cardiologist: [name], MD   Chief Complaint  Patient presents with  . Coronary Artery Disease    History of Present Illness: Paul Wood. is an 81 y.o. male last seen in July 2015. He has a history of previous stroke, records also show hospitalization for TIA in August of this year, managed on the hospitalist team. He reports no problems since that time. Remains functionally active at home, is able to bring in wood for his woodstove, go up and down the stairs without difficulty. With these activities he does not report any angina symptoms or nitroglycerin use.  We reviewed his cardiac medications, no changes of been made. Blood pressure control is reasonable today. I reviewed his ECG from August.  He continues to follow with Dr. Pleas Koch.   Past Medical History  Diagnosis Date  . Hypothyroidism   . Mixed hyperlipidemia   . Ventral hernia   . Hearing loss   . Proteinuria     Mild  . Gout   . Hypertrophy of prostate without urinary obstruction and other lower urinary tract symptoms (LUTS)   . PUD (peptic ulcer disease)   . Depression   . Essential hypertension, benign   . Type 2 diabetes mellitus ([name])   . Coronary atherosclerosis of native coronary artery     4/12 - DES mid RCA, DES distal LAD, DES x2 mid LAD  . Incarcerated umbilical hernia   . Stroke [facility])     2008; slight st memory deficirs since last stroke    Current Outpatient Prescriptions  Medication Sig Dispense Refill  . allopurinol (ZYLOPRIM) 300 MG tablet Take 300 mg by mouth daily.    Marland Kitchen aspirin EC 81 MG tablet Take 81 mg by mouth daily.    Marland Kitchen atorvastatin (LIPITOR) 80 MG tablet TAKE 1 TABLET BY MOUTH AT BEDTIME ( PT NEEDS OFFICE VISIT) 15 tablet 0  . carvedilol (COREG) 12.5 MG tablet TAKE 1 TABLET BY MOUTH TWICE DAILY 60 tablet 0  . clopidogrel (PLAVIX) 75 MG  tablet TAKE 1 TABLET BY MOUTH EVERY DAY (PATIENT NEEDS OFFICE VISIT) 30 tablet 3  . insulin glargine (LANTUS SOLOSTAR) 100 UNIT/ML injection Inject 14 Units into the skin at bedtime.     Marland Kitchen levothyroxine (SYNTHROID, LEVOTHROID) 88 MCG tablet Take 88 mcg by mouth daily before breakfast.    . lisinopril (PRINIVIL,ZESTRIL) 20 MG tablet TAKE 1 TABLET BY MOUTH EVERY DAY (PT NEEDS OFFICE VISIT) 15 tablet 0  . metFORMIN (GLUCOPHAGE) 1000 MG tablet Take 1,000 mg by mouth 2 (two) times daily with a meal.      . pantoprazole (PROTONIX) 40 MG tablet Take 40 mg by mouth 2 (two) times daily.    . tamsulosin (FLOMAX) 0.4 MG CAPS capsule Take 0.4 mg by mouth daily.     No current facility-administered medications for this visit.    Allergies:  Review of patient's allergies indicates no known allergies.   Social History: The patient  reports that he has never smoked. He has never used smokeless tobacco. He reports that he does not drink alcohol or use illicit drugs.   ROS:  Please see the history of present illness. Otherwise, complete review of systems is positive for arthritis.  All other systems are reviewed and negative.   Physical Exam: VS:  BP 137/61 mmHg  Pulse 54  Ht 6\' 2"  (1.88  m)  Wt 261 lb (118.389 kg)  BMI 33.50 kg/m2  SpO2 99%, BMI Body mass index is 33.5 kg/(m^2).  Wt Readings from Last 3 Encounters:  05/07/15 261 lb (118.389 kg)  02/08/15 260 lb 5.8 oz (118.1 kg)  02/08/15 260 lb (117.935 kg)     No acute distress.  HEENT: Conjunctiva and lids normal, oropharynx clear with moist mucosa.  Neck: Supple, increased girth, no elevated JVP or carotid bruits.  Lungs: Clear to auscultation, decreased at bases, nonlabored breathing at rest.  Cardiac: Regular rate and rhythm, no S3 or significant systolic murmur.  Abdomen: Obese, nontender, bowel sounds present.  Extremities: 2+ edema, distal pulses 2+.   ECG: Tracing from 02/08/2015 showed sinus rhythm with prolonged PR interval and  PVCs.   Recent Labwork: 02/08/2015: ALT 14*; AST 21 02/09/2015: BUN 24*; Creatinine, Ser 1.35*; Hemoglobin 8.4*; Platelets 204; Potassium 3.7; Sodium 142   Other Studies Reviewed Today:  Echocardiogram 02/09/2015: Study Conclusions  - Left ventricle: The cavity size was normal. Systolic function was normal. The estimated ejection fraction was in the range of 60% to 65%. Wall motion was normal; there were no regional wall motion abnormalities. Features are consistent with a pseudonormal left ventricular filling pattern, with concomitant abnormal relaxation and increased filling pressure (grade 2 diastolic dysfunction). Doppler parameters are consistent with high ventricular filling pressure. Moderate to severe concentric left ventricular hypertrophy. Septal thickness, ED (2D): 15.7 mm. Posterior wall thickness, ED (2D): 14.2 mm. Ratio of mitral valve peak E velocity to medial annulus E (e&') velocity: 23. - Aortic valve: Mildly calcified annulus. Trileaflet; mildly thickened leaflets. There was trivial regurgitation. - Mitral valve: Mildly calcified annulus. There was trivial regurgitation. - Left atrium: The atrium was moderately dilated. - Right atrium: The atrium was mildly dilated.  Carotid Dopplers 02/09/2015: IMPRESSION: 1. Mild (1-49%) stenosis proximal right internal carotid artery secondary to smooth heterogeneous atherosclerotic plaque. 2. Mild intimal medial thickening and trace atherosclerotic plaque on the left without evidence of internal carotid stenosis. 3. Vertebral arteries are patent with normal antegrade flow. 4. Pulsus bisferiens noted. This is often associated with aortic stenosis or aortic stenosis with regurgitation. 5. Irregularity of the cardiac rhythm. Does the patient have a history of atrial fibrillation?  Brain MRI 02/08/2015: FINDINGS: Negative for acute infarct.  Chronic left occipital infarct, not present on the prior CT.  Chronic infarct right medial thalamus. Chronic microvascular ischemia in the cerebral white matter. Brainstem and cerebellum intact  Pituitary normal in size. Craniocervical junction normal.  Mild mucosal edema in the paranasal sinuses. Bilateral lens extraction.  Negative for intracranial hemorrhage. Negative for mass or edema. No shift of the midline structures.  IMPRESSION: Chronic ischemia including the left occipital lobe and right thalamus and white matter.  Negative for acute infarct or mass.   Assessment and Plan:  1. CAD status post DES to the RCA and LAD back in 2012. He remains symptomatically stable on medical therapy. I reviewed his most recent ECG. We have talked about follow-up surveillance stress testing, but at this point he is comfortable with observation. We discussed warning signs and symptoms. Otherwise follow-up in 6 months.  2. Essential hypertension, no changes made to current regimen.  3. Cerebrovascular disease with suspected TIA back in August. He is on aspirin and Plavix, also statin therapy. No acute event by head MRI, and carotid Dopplers did not demonstrate any obstructive stenoses.  Current medicines were reviewed with the patient today.  Disposition: FU with me in 6 months.  Signed, Satira Sark, MD, [facility] 05/07/2015 11:01 AM    [facility] [place], [place], [place] [address] Phone: [phone]; Fax: [phone]

## 2015-05-16 ENCOUNTER — Emergency Department (HOSPITAL_COMMUNITY): Payer: Medicare HMO

## 2015-05-16 ENCOUNTER — Inpatient Hospital Stay (HOSPITAL_COMMUNITY)
Admission: EM | Admit: 2015-05-16 | Discharge: 2015-05-19 | DRG: 064 | Disposition: A | Discharge status: Home Health | Payer: Medicare HMO | Attending: Internal Medicine | Admitting: Internal Medicine

## 2015-05-16 ENCOUNTER — Encounter (HOSPITAL_COMMUNITY): Payer: Self-pay

## 2015-05-16 DIAGNOSIS — E782 Mixed hyperlipidemia: Secondary | ICD-10-CM | POA: Diagnosis present

## 2015-05-16 DIAGNOSIS — F0150 Vascular dementia without behavioral disturbance: Secondary | ICD-10-CM | POA: Diagnosis present

## 2015-05-16 DIAGNOSIS — I2510 Atherosclerotic heart disease of native coronary artery without angina pectoris: Secondary | ICD-10-CM | POA: Diagnosis present

## 2015-05-16 DIAGNOSIS — R4701 Aphasia: Secondary | ICD-10-CM | POA: Diagnosis present

## 2015-05-16 DIAGNOSIS — G459 Transient cerebral ischemic attack, unspecified: Secondary | ICD-10-CM | POA: Diagnosis not present

## 2015-05-16 DIAGNOSIS — Z7902 Long term (current) use of antithrombotics/antiplatelets: Secondary | ICD-10-CM | POA: Diagnosis not present

## 2015-05-16 DIAGNOSIS — N189 Chronic kidney disease, unspecified: Secondary | ICD-10-CM | POA: Diagnosis present

## 2015-05-16 DIAGNOSIS — N182 Chronic kidney disease, stage 2 (mild): Secondary | ICD-10-CM | POA: Diagnosis not present

## 2015-05-16 DIAGNOSIS — R471 Dysarthria and anarthria: Secondary | ICD-10-CM | POA: Diagnosis present

## 2015-05-16 DIAGNOSIS — M109 Gout, unspecified: Secondary | ICD-10-CM | POA: Diagnosis present

## 2015-05-16 DIAGNOSIS — H53461 Homonymous bilateral field defects, right side: Secondary | ICD-10-CM | POA: Diagnosis present

## 2015-05-16 DIAGNOSIS — E669 Obesity, unspecified: Secondary | ICD-10-CM | POA: Diagnosis present

## 2015-05-16 DIAGNOSIS — N183 Chronic kidney disease, stage 3 unspecified: Secondary | ICD-10-CM | POA: Diagnosis present

## 2015-05-16 DIAGNOSIS — Z794 Long term (current) use of insulin: Secondary | ICD-10-CM | POA: Diagnosis not present

## 2015-05-16 DIAGNOSIS — E039 Hypothyroidism, unspecified: Secondary | ICD-10-CM | POA: Diagnosis present

## 2015-05-16 DIAGNOSIS — Z8261 Family history of arthritis: Secondary | ICD-10-CM

## 2015-05-16 DIAGNOSIS — E1122 Type 2 diabetes mellitus with diabetic chronic kidney disease: Secondary | ICD-10-CM | POA: Diagnosis present

## 2015-05-16 DIAGNOSIS — G9340 Encephalopathy, unspecified: Secondary | ICD-10-CM | POA: Diagnosis not present

## 2015-05-16 DIAGNOSIS — N400 Enlarged prostate without lower urinary tract symptoms: Secondary | ICD-10-CM | POA: Diagnosis present

## 2015-05-16 DIAGNOSIS — Z820 Family history of epilepsy and other diseases of the nervous system: Secondary | ICD-10-CM

## 2015-05-16 DIAGNOSIS — Z7982 Long term (current) use of aspirin: Secondary | ICD-10-CM | POA: Diagnosis not present

## 2015-05-16 DIAGNOSIS — D649 Anemia, unspecified: Secondary | ICD-10-CM

## 2015-05-16 DIAGNOSIS — Z955 Presence of coronary angioplasty implant and graft: Secondary | ICD-10-CM

## 2015-05-16 DIAGNOSIS — I129 Hypertensive chronic kidney disease with stage 1 through stage 4 chronic kidney disease, or unspecified chronic kidney disease: Secondary | ICD-10-CM | POA: Diagnosis present

## 2015-05-16 DIAGNOSIS — I10 Essential (primary) hypertension: Secondary | ICD-10-CM | POA: Diagnosis not present

## 2015-05-16 DIAGNOSIS — Z8673 Personal history of transient ischemic attack (TIA), and cerebral infarction without residual deficits: Secondary | ICD-10-CM

## 2015-05-16 DIAGNOSIS — I639 Cerebral infarction, unspecified: Secondary | ICD-10-CM | POA: Diagnosis present

## 2015-05-16 DIAGNOSIS — R29707 NIHSS score 7: Secondary | ICD-10-CM | POA: Diagnosis present

## 2015-05-16 DIAGNOSIS — Z6831 Body mass index (BMI) 31.0-31.9, adult: Secondary | ICD-10-CM | POA: Diagnosis not present

## 2015-05-16 DIAGNOSIS — E860 Dehydration: Secondary | ICD-10-CM

## 2015-05-16 DIAGNOSIS — Z8249 Family history of ischemic heart disease and other diseases of the circulatory system: Secondary | ICD-10-CM

## 2015-05-16 DIAGNOSIS — R569 Unspecified convulsions: Secondary | ICD-10-CM | POA: Diagnosis not present

## 2015-05-16 DIAGNOSIS — R410 Disorientation, unspecified: Secondary | ICD-10-CM | POA: Diagnosis not present

## 2015-05-16 DIAGNOSIS — R4182 Altered mental status, unspecified: Secondary | ICD-10-CM | POA: Diagnosis not present

## 2015-05-16 DIAGNOSIS — Z9114 Patient's other noncompliance with medication regimen: Secondary | ICD-10-CM | POA: Diagnosis not present

## 2015-05-16 DIAGNOSIS — G9341 Metabolic encephalopathy: Secondary | ICD-10-CM | POA: Diagnosis not present

## 2015-05-16 DIAGNOSIS — I6930 Unspecified sequelae of cerebral infarction: Secondary | ICD-10-CM | POA: Diagnosis not present

## 2015-05-16 DIAGNOSIS — I161 Hypertensive emergency: Secondary | ICD-10-CM | POA: Diagnosis not present

## 2015-05-16 DIAGNOSIS — H53469 Homonymous bilateral field defects, unspecified side: Secondary | ICD-10-CM | POA: Diagnosis present

## 2015-05-16 DIAGNOSIS — R69 Illness, unspecified: Secondary | ICD-10-CM | POA: Diagnosis not present

## 2015-05-16 DIAGNOSIS — E118 Type 2 diabetes mellitus with unspecified complications: Secondary | ICD-10-CM | POA: Diagnosis not present

## 2015-05-16 LAB — DIFFERENTIAL
BASOS PCT: 0 %
Basophils Absolute: 0 10*3/uL (ref 0.0–0.1)
EOS ABS: 0.1 10*3/uL (ref 0.0–0.7)
Eosinophils Relative: 2 %
Lymphocytes Relative: 30 %
Lymphs Abs: 1.4 10*3/uL (ref 0.7–4.0)
Monocytes Absolute: 0.3 10*3/uL (ref 0.1–1.0)
Monocytes Relative: 6 %
NEUTROS PCT: 62 %
Neutro Abs: 3 10*3/uL (ref 1.7–7.7)

## 2015-05-16 LAB — CBC
HCT: 27.6 % — ABNORMAL LOW (ref 39.0–52.0)
Hemoglobin: 8.4 g/dL — ABNORMAL LOW (ref 13.0–17.0)
MCH: 24.6 pg — AB (ref 26.0–34.0)
MCHC: 30.4 g/dL (ref 30.0–36.0)
MCV: 80.7 fL (ref 78.0–100.0)
PLATELETS: 183 10*3/uL (ref 150–400)
RBC: 3.42 MIL/uL — ABNORMAL LOW (ref 4.22–5.81)
RDW: 18.3 % — ABNORMAL HIGH (ref 11.5–15.5)
WBC: 4.8 10*3/uL (ref 4.0–10.5)

## 2015-05-16 LAB — COMPREHENSIVE METABOLIC PANEL
ALBUMIN: 3.7 g/dL (ref 3.5–5.0)
ALT: 12 U/L — ABNORMAL LOW (ref 17–63)
AST: 20 U/L (ref 15–41)
Alkaline Phosphatase: 66 U/L (ref 38–126)
Anion gap: 5 (ref 5–15)
BUN: 26 mg/dL — ABNORMAL HIGH (ref 6–20)
CO2: 26 mmol/L (ref 22–32)
Calcium: 8.3 mg/dL — ABNORMAL LOW (ref 8.9–10.3)
Chloride: 111 mmol/L (ref 101–111)
Creatinine, Ser: 1.71 mg/dL — ABNORMAL HIGH (ref 0.61–1.24)
GFR calc Af Amer: 41 mL/min — ABNORMAL LOW (ref >60)
GFR calc non Af Amer: 36 mL/min — ABNORMAL LOW (ref >60)
GLUCOSE: 106 mg/dL — AB (ref 65–99)
POTASSIUM: 4.5 mmol/L (ref 3.5–5.1)
Sodium: 142 mmol/L (ref 135–145)
Total Bilirubin: 0.3 mg/dL (ref 0.3–1.2)
Total Protein: 6.6 g/dL (ref 6.5–8.1)

## 2015-05-16 LAB — ETHANOL: Alcohol, Ethyl (B): <5 mg/dL (ref <5)

## 2015-05-16 LAB — GLUCOSE, CAPILLARY
GLUCOSE-CAPILLARY: 83 mg/dL (ref 65–99)
GLUCOSE-CAPILLARY: 98 mg/dL (ref 65–99)

## 2015-05-16 LAB — PROTIME-INR
INR: 0.99 (ref 0.00–1.49)
Prothrombin Time: 13.3 seconds (ref 11.6–15.2)

## 2015-05-16 LAB — APTT: aPTT: 28 seconds (ref 24–37)

## 2015-05-16 LAB — TROPONIN I: Troponin I: <0.03 ng/mL (ref <0.031)

## 2015-05-16 LAB — CBG MONITORING, ED: Glucose-Capillary: 94 mg/dL (ref 65–99)

## 2015-05-16 MED ORDER — INSULIN ASPART 100 UNIT/ML ~~LOC~~ SOLN: 0.0000 [IU] | Freq: Every day | SUBCUTANEOUS | Status: DC

## 2015-05-16 MED ORDER — INSULIN GLARGINE 100 UNIT/ML ~~LOC~~ SOLN
14.0000 [IU] | Freq: Every day | SUBCUTANEOUS | Status: DC
  Administered 2015-05-17 – 2015-05-18 (×2): 14 [IU] via SUBCUTANEOUS
  Filled 2015-05-16 (×5): qty 0.14

## 2015-05-16 MED ORDER — ENOXAPARIN SODIUM 30 MG/0.3ML ~~LOC~~ SOLN
30.0000 mg | SUBCUTANEOUS | Status: DC
Start: 2015-05-16 — End: 2015-05-16

## 2015-05-16 MED ORDER — CARVEDILOL 12.5 MG PO TABS
12.5000 mg | ORAL_TABLET | Freq: Two times a day (BID) | ORAL | Status: DC
  Administered 2015-05-17 – 2015-05-19 (×4): 12.5 mg via ORAL
  Filled 2015-05-16 (×4): qty 1

## 2015-05-16 MED ORDER — SODIUM CHLORIDE 0.9 % IV SOLN
INTRAVENOUS | Status: DC
  Administered 2015-05-16 – 2015-05-18 (×2): via INTRAVENOUS

## 2015-05-16 MED ORDER — CARVEDILOL 12.5 MG PO TABS: 12.5000 mg | ORAL_TABLET | Freq: Two times a day (BID) | ORAL | Status: DC

## 2015-05-16 MED ORDER — CLOPIDOGREL BISULFATE 75 MG PO TABS
75.0000 mg | ORAL_TABLET | Freq: Every day | ORAL | Status: DC
  Administered 2015-05-17 – 2015-05-18 (×2): 75 mg via ORAL
  Filled 2015-05-16 (×2): qty 1

## 2015-05-16 MED ORDER — ASPIRIN EC 81 MG PO TBEC
81.0000 mg | DELAYED_RELEASE_TABLET | Freq: Every day | ORAL | Status: DC
  Administered 2015-05-17 – 2015-05-18 (×2): 81 mg via ORAL
  Filled 2015-05-16 (×2): qty 1

## 2015-05-16 MED ORDER — LISINOPRIL 10 MG PO TABS
20.0000 mg | ORAL_TABLET | Freq: Every day | ORAL | Status: DC
  Administered 2015-05-17 – 2015-05-19 (×3): 20 mg via ORAL
  Filled 2015-05-16 (×3): qty 2

## 2015-05-16 MED ORDER — STROKE: EARLY STAGES OF RECOVERY BOOK
Freq: Once | Status: AC
  Administered 2015-05-17: 08:00:00
  Filled 2015-05-16: qty 1

## 2015-05-16 MED ORDER — HYDRALAZINE HCL 20 MG/ML IJ SOLN
10.0000 mg | INTRAMUSCULAR | Status: DC | PRN
  Administered 2015-05-17: 10 mg via INTRAVENOUS
  Filled 2015-05-16 (×2): qty 1

## 2015-05-16 MED ORDER — ENOXAPARIN SODIUM 30 MG/0.3ML ~~LOC~~ SOLN
30.0000 mg | SUBCUTANEOUS | Status: DC
  Administered 2015-05-16 – 2015-05-18 (×3): 30 mg via SUBCUTANEOUS
  Filled 2015-05-16 (×3): qty 0.3

## 2015-05-16 MED ORDER — METFORMIN HCL 500 MG PO TABS
1000.0000 mg | ORAL_TABLET | Freq: Two times a day (BID) | ORAL | Status: DC
  Administered 2015-05-17: 1000 mg via ORAL
  Filled 2015-05-16: qty 2

## 2015-05-16 MED ORDER — TAMSULOSIN HCL 0.4 MG PO CAPS
0.4000 mg | ORAL_CAPSULE | Freq: Every day | ORAL | Status: DC
  Administered 2015-05-17 – 2015-05-19 (×3): 0.4 mg via ORAL
  Filled 2015-05-16 (×3): qty 1

## 2015-05-16 MED ORDER — PANTOPRAZOLE SODIUM 40 MG PO TBEC
40.0000 mg | DELAYED_RELEASE_TABLET | Freq: Two times a day (BID) | ORAL | Status: DC
  Administered 2015-05-17 – 2015-05-19 (×5): 40 mg via ORAL
  Filled 2015-05-16 (×5): qty 1

## 2015-05-16 MED ORDER — ATORVASTATIN CALCIUM 40 MG PO TABS
80.0000 mg | ORAL_TABLET | Freq: Every day | ORAL | Status: DC
  Administered 2015-05-17 – 2015-05-18 (×2): 80 mg via ORAL
  Filled 2015-05-16 (×2): qty 2

## 2015-05-16 MED ORDER — INSULIN ASPART 100 UNIT/ML ~~LOC~~ SOLN
0.0000 [IU] | SUBCUTANEOUS | Status: DC
  Administered 2015-05-17: 2 [IU] via SUBCUTANEOUS
  Administered 2015-05-18 (×2): 3 [IU] via SUBCUTANEOUS
  Administered 2015-05-19 (×2): 2 [IU] via SUBCUTANEOUS

## 2015-05-16 MED ORDER — AMLODIPINE BESYLATE 5 MG PO TABS
5.0000 mg | ORAL_TABLET | Freq: Every day | ORAL | Status: DC
  Administered 2015-05-17 – 2015-05-19 (×3): 5 mg via ORAL
  Filled 2015-05-16 (×3): qty 1

## 2015-05-16 MED ORDER — INSULIN ASPART 100 UNIT/ML ~~LOC~~ SOLN: 0.0000 [IU] | Freq: Three times a day (TID) | SUBCUTANEOUS | Status: DC

## 2015-05-16 MED ORDER — ALLOPURINOL 300 MG PO TABS
300.0000 mg | ORAL_TABLET | Freq: Every day | ORAL | Status: DC
  Administered 2015-05-17 – 2015-05-19 (×3): 300 mg via ORAL
  Filled 2015-05-16 (×3): qty 1

## 2015-05-16 MED ORDER — HYDRALAZINE HCL 20 MG/ML IJ SOLN
10.0000 mg | Freq: Once | INTRAMUSCULAR | Status: AC
  Administered 2015-05-16: 10 mg via INTRAVENOUS

## 2015-05-16 MED ORDER — LEVOTHYROXINE SODIUM 88 MCG PO TABS
88.0000 ug | ORAL_TABLET | Freq: Every day | ORAL | Status: DC
  Administered 2015-05-17 – 2015-05-19 (×3): 88 ug via ORAL
  Filled 2015-05-16 (×3): qty 1

## 2015-05-16 NOTE — ED Notes (Signed)
Pt is alert, unable to state name or date. Words nonsensical. Answers ok or all right to questions. Grip strengths are equal, but require asking pt multiple times to grip. Able to follow command to lift arms and legs, but unable to touch nose with finger or follow my finger with his eyes. Face symmetrical
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
## 2015-05-16 NOTE — ED Notes (Signed)
Pt confused x4,  pt unable to void in urinal, unable to understand what he needs to do.

## 2015-05-16 NOTE — ED Notes (Signed)
Attempted to obtain urine via in and out cath. Resistance met, unable to obtain urine. Dr. Christy Gentles notified

## 2015-05-16 NOTE — ED Notes (Signed)
EDP wanting pt to have MRI and go to room that has been assigned. Nurse on floor unable to take report at this time.

## 2015-05-16 NOTE — H&P (Signed)
[facility] History and Physical  Paul Wood. [id number] DOB: 21-Dec-1931 DOA: 05/16/2015  Referring physician: ER PCP: [name], MD   Chief Complaint: Altered mental status  HPI: Paul Wood. is a 81 y.o. male  This is an 81 year old man who presented to the hospital in August of this year with symptoms of TIA. He appears to have had a similar event today with sudden onset of altered mental status approximately 7 hours ago. His wife relates that he also has had difficulty with speech in its ability to understand it and also speak it. There is no obvious limb weakness. There is no obvious gait weakness. He was last seen his usual self at 12 noon today. He is now being admitted for further investigation.   Review of Systems:  Apart from symptoms above, all systems are negative. The patient himself cannot give me any clear answers to any questions.  Past Medical History  Diagnosis Date  . Hypothyroidism   . Mixed hyperlipidemia   . Ventral hernia   . Hearing loss   . Proteinuria     Mild  . Gout   . Hypertrophy of prostate without urinary obstruction and other lower urinary tract symptoms (LUTS)   . PUD (peptic ulcer disease)   . Depression   . Essential hypertension, benign   . Type 2 diabetes mellitus ([place])   . Coronary atherosclerosis of native coronary artery     4/12 - DES mid RCA, DES distal LAD, DES x2 mid LAD  . Incarcerated umbilical hernia   . Stroke [facility])     2008; slight st memory deficirs since last stroke   Past Surgical History  Procedure Laterality Date  . Multiple tooth extractions      HX DENTAL CARIES/ALMOST ALL REMOVED  . Hernia repair  01/04/14    Dr. Lajuan Lines  . Cataract extraction w/phaco Right 08/15/2014    Procedure: CATARACT EXTRACTION PHACO AND INTRAOCULAR LENS PLACEMENT ([place]);  Surgeon: Elta Guadeloupe T. Gershon Crane, MD;  Location: [facility];  Service: Ophthalmology;  Laterality: Right;  CDE:10.77   Social History:  reports  that he has never smoked. He has never used smokeless tobacco. He reports that he does not drink alcohol or use illicit drugs.  No Known Allergies  Family History  Problem Relation Age of Onset  . Gout Father     DIED AGE 31-HOUSE FIRE  . Alzheimer's disease Mother     DIED AGE 64/uncertain hx  . Other Brother     2 brothers deceased:1-gun shot wound & 1-hypothermia(froze to death)  . Colon cancer Sister     28 sisters:1-colon cancer age 23's  . Gout Son     age 78  . Hypertension Son     age 78  . Hyperlipidemia Son     age 68  . Other Daughter     age 10:leg problems,Knee OA    Prior to Admission medications   Medication Sig Start Date End Date Taking? Authorizing Nicolaus Andel  aspirin EC 81 MG tablet Take 81 mg by mouth daily.   Yes Historical Rehema Muffley, MD  allopurinol (ZYLOPRIM) 300 MG tablet Take 300 mg by mouth daily.    Historical Saveah Bahar, MD  atorvastatin (LIPITOR) 80 MG tablet TAKE 1 TABLET BY MOUTH AT BEDTIME ( PT NEEDS OFFICE VISIT) 09/06/14   Satira Sark, MD  carvedilol (COREG) 12.5 MG tablet TAKE 1 TABLET BY MOUTH TWICE DAILY 05/29/14   Satira Sark, MD  clopidogrel (PLAVIX) 75 MG  tablet TAKE 1 TABLET BY MOUTH EVERY DAY (PATIENT NEEDS OFFICE VISIT) 01/27/14   Satira Sark, MD  LEVEMIR FLEXTOUCH 100 UNIT/ML Pen Inject 14 Units into the skin daily at 10 pm.  04/24/15   Historical Sihaam Chrobak, MD  levothyroxine (SYNTHROID, LEVOTHROID) 88 MCG tablet Take 88 mcg by mouth daily before breakfast.    Historical Lawsyn Heiler, MD  lisinopril (PRINIVIL,ZESTRIL) 20 MG tablet TAKE 1 TABLET BY MOUTH EVERY DAY (PT NEEDS OFFICE VISIT) 09/06/14   Satira Sark, MD  metFORMIN (GLUCOPHAGE) 1000 MG tablet Take 1,000 mg by mouth 2 (two) times daily with a meal.      Historical Moriah Loughry, MD  pantoprazole (PROTONIX) 40 MG tablet Take 40 mg by mouth 2 (two) times daily.    Historical Shariya Gaster, MD  tamsulosin (FLOMAX) 0.4 MG CAPS capsule Take 0.4 mg by mouth daily. 06/27/13   Historical  Wallis Spizzirri, MD   Physical Exam: Filed Vitals:   05/16/15 1715 05/16/15 1800 05/16/15 1815 05/16/15 1830  BP: 215/83 207/86 228/77 212/88  Pulse: 48 55 49   Resp: 21 20 20 21   SpO2: 100% 100% 100%     Wt Readings from Last 3 Encounters:  05/07/15 118.389 kg (261 lb)  02/08/15 118.1 kg (260 lb 5.8 oz)  02/08/15 117.935 kg (260 lb)    General:  Appears confused. Eyes: PERRL, normal lids, irises & conjunctiva ENT: grossly normal hearing, lips & tongue Neck: no LAD, masses or thyromegaly Cardiovascular: RRR, no m/r/g. No LE edema. Telemetry: SR, no arrhythmias  Respiratory: CTA bilaterally, no w/r/r. Normal respiratory effort. Abdomen: soft, ntnd Skin: no rash or induration seen on limited exam Musculoskeletal: grossly normal tone BUE/BLE Psychiatric: grossly normal mood and affect, speech fluent and appropriate Neurologic: grossly non-focal. He has fluent dysphasia.           Labs on Admission:  Basic Metabolic Panel:  Recent Labs Lab 05/16/15 1706  NA 142  K 4.5  CL 111  CO2 26  GLUCOSE 106*  BUN 26*  CREATININE 1.71*  CALCIUM 8.3*   Liver Function Tests:  Recent Labs Lab 05/16/15 1706  AST 20  ALT 12*  ALKPHOS 66  BILITOT 0.3  PROT 6.6  ALBUMIN 3.7   No results for input(s): LIPASE, AMYLASE in the last 168 hours. No results for input(s): AMMONIA in the last 168 hours. CBC:  Recent Labs Lab 05/16/15 1706  WBC 4.8  NEUTROABS 3.0  HGB 8.4*  HCT 27.6*  MCV 80.7  PLT 183   Cardiac Enzymes:  Recent Labs Lab 05/16/15 1706  TROPONINI <0.03    BNP (last 3 results) No results for input(s): BNP in the last 8760 hours.  ProBNP (last 3 results) No results for input(s): PROBNP in the last 8760 hours.  CBG:  Recent Labs Lab 05/16/15 1626  GLUCAP 94    Radiological Exams on Admission: Ct Head Wo Contrast  05/16/2015  CLINICAL DATA:  Confusion, last seen normal at noon EXAM: CT HEAD WITHOUT CONTRAST TECHNIQUE: Contiguous axial images were  obtained from the base of the skull through the vertex without intravenous contrast. COMPARISON:  MRI brain dated 02/08/2015.  CT head dated 02/27/2014. FINDINGS: No evidence of parenchymal hemorrhage or extra-axial fluid collection. No mass lesion, mass effect, or midline shift. No CT evidence of acute infarction. Encephalomalacic changes in the left occipital region, related to prior infarct. Intracranial atherosclerosis. Mild cortical atrophy.  No ventriculomegaly. The visualized paranasal sinuses are essentially clear. The mastoid air cells are unopacified. No evidence  of calvarial fracture. IMPRESSION: No evidence of acute intracranial abnormality. Encephalomalacic changes related to prior left occipital infarct. Cortical atrophy. Electronically Signed   By: Julian Hy M.D.   On: 05/16/2015 17:42      Assessment/Plan   1. TIA/CVA. He will have workup with this. Neurology consultation. MRI brain. 2. Diabetes. Continue with home medications and sliding scale insulin. 3. Hypertension. His blood pressure is elevated. I will add amlodipine to his medications and use intravenous hydralazine when necessary. 4. Chronic renal insufficiency. Appears to be stable. Gentle IV fluids.  He'll be admitted to telemetry. Further recommendations will depend on patient's hospital progress.  Code Status: Full code.   DVT Prophylaxis: Lovenox.  Family Communication: I discussed the plan with the patient's family at the bedside.  Disposition Plan: Home when medically stable.   Time spent: 60 mins.  Doree Albee [facility] Pager [phone].

## 2015-05-17 ENCOUNTER — Inpatient Hospital Stay (HOSPITAL_COMMUNITY): Payer: Medicare HMO

## 2015-05-17 DIAGNOSIS — N183 Chronic kidney disease, stage 3 (moderate): Secondary | ICD-10-CM

## 2015-05-17 DIAGNOSIS — I10 Essential (primary) hypertension: Secondary | ICD-10-CM

## 2015-05-17 DIAGNOSIS — E118 Type 2 diabetes mellitus with unspecified complications: Secondary | ICD-10-CM

## 2015-05-17 DIAGNOSIS — G459 Transient cerebral ischemic attack, unspecified: Secondary | ICD-10-CM

## 2015-05-17 LAB — GLUCOSE, CAPILLARY
GLUCOSE-CAPILLARY: 83 mg/dL (ref 65–99)
GLUCOSE-CAPILLARY: 80 mg/dL (ref 65–99)
GLUCOSE-CAPILLARY: 158 mg/dL — AB (ref 65–99)
GLUCOSE-CAPILLARY: 114 mg/dL — AB (ref 65–99)
Glucose-Capillary: 70 mg/dL (ref 65–99)
Glucose-Capillary: 70 mg/dL (ref 65–99)
Glucose-Capillary: 78 mg/dL (ref 65–99)
Glucose-Capillary: 148 mg/dL — ABNORMAL HIGH (ref 65–99)

## 2015-05-17 LAB — RAPID URINE DRUG SCREEN, HOSP PERFORMED
Amphetamines: NOT DETECTED
BENZODIAZEPINES: NOT DETECTED
Barbiturates: NOT DETECTED
COCAINE: NOT DETECTED
Opiates: NOT DETECTED
Tetrahydrocannabinol: NOT DETECTED

## 2015-05-17 LAB — URINALYSIS, ROUTINE W REFLEX MICROSCOPIC
Bilirubin Urine: NEGATIVE
GLUCOSE, UA: NEGATIVE mg/dL
Hgb urine dipstick: NEGATIVE
Ketones, ur: NEGATIVE mg/dL
Leukocytes, UA: NEGATIVE
NITRITE: NEGATIVE
PROTEIN: NEGATIVE mg/dL
Specific Gravity, Urine: 1.015 (ref 1.005–1.030)
Urobilinogen, UA: 0.2 mg/dL (ref 0.0–1.0)
pH: 7 (ref 5.0–8.0)

## 2015-05-17 LAB — SEDIMENTATION RATE: SED RATE: 11 mm/h (ref 0–16)

## 2015-05-17 LAB — VITAMIN B12: VITAMIN B 12: 179 pg/mL — AB (ref 180–914)

## 2015-05-17 LAB — LIPID PANEL
Cholesterol: 141 mg/dL (ref 0–200)
HDL: 58 mg/dL (ref >40)
LDL CALC: 73 mg/dL (ref 0–99)
Total CHOL/HDL Ratio: 2.4 RATIO
Triglycerides: 48 mg/dL (ref <150)
VLDL: 10 mg/dL (ref 0–40)

## 2015-05-17 LAB — AMMONIA: Ammonia: 31 umol/L (ref 9–35)

## 2015-05-17 MED ORDER — DEXTROSE-NACL 5-0.9 % IV SOLN
INTRAVENOUS | Status: DC
  Administered 2015-05-17: 11:00:00 via INTRAVENOUS

## 2015-05-17 NOTE — Evaluation (Signed)
Physical Therapy Evaluation Patient Details Name: Paul Wood. MRN: [id number] DOB: [birth year] Today's Date: 05/17/2015   History of Present Illness  This is an 81 year old man who presented to the hospital in August of this year with symptoms of TIA. He appears to have had a similar event today with sudden onset of altered mental status approximately 7 hours ago. His wife relates that he also has had difficulty with speech in its ability to understand it and also speak it. There is no obvious limb weakness. There is no obvious gait weakness. He was last seen his usual self at 12 noon today. He is now being admitted for further investigation.  Clinical Impression   Pt was seen for evaluation.  He had no abnormalities in strength, balance or coordination.  He is at functional baseline.  His receptive cognition is mildly decreased and expressive is moderately decreased.    Follow Up Recommendations No PT follow up    Equipment Recommendations  None recommended by PT    Recommendations for Other Services   speech therapy    Precautions / Restrictions Precautions Precautions: None Restrictions Weight Bearing Restrictions: No      Mobility  Bed Mobility Overal bed mobility: Independent                Transfers Overall transfer level: Independent                  Ambulation/Gait Ambulation/Gait assistance: Modified independent (Device/Increase time) Ambulation Distance (Feet): 350 Feet Assistive device: Straight cane Gait Pattern/deviations: WFL(Within Functional Limits)   Gait velocity interpretation: >2.62 ft/sec, indicative of independent [employer] information [employer] manager [place] (Stroke Patients Only)       Balance Overall balance assessment: No apparent balance deficits (not formally assessed)                                           Pertinent Vitals/Pain Pain Assessment:  No/denies pain    Home Living Family/patient expects to be discharged to:: Private residence Living Arrangements: Spouse/significant other;Children Available Help at Discharge: Family Type of Home: House         Home Equipment: Cane - single point;Grab bars - tub/shower;Hand held shower head      Prior Function Level of Independence: Independent               Hand Dominance   Dominant Hand: Right    Extremity/Trunk Assessment   Upper Extremity Assessment: Defer to OT evaluation           Lower Extremity Assessment: Overall WFL for tasks assessed      Cervical / Trunk Assessment: Normal  Communication   Communication: Receptive difficulties;Expressive difficulties (receptive is mild, expressive is moderate)  Cognition Arousal/Alertness: Awake/alert Behavior During Therapy: WFL for tasks assessed/performed Overall Cognitive Status: Impaired/Different from baseline Area of Impairment: Following commands;Safety/judgement;Problem solving       Following Commands: Follows one step commands inconsistently     Problem Solving: Difficulty sequencing;Requires verbal cues;Requires tactile cues General Comments: Pt with receptive difficulties and expressive difficulties, esp regarding word finding difficulties    General Comments      Exercises        Assessment/Plan    PT Assessment Patent does not need any further PT services  PT Diagnosis     PT Problem List    PT Treatment Interventions     PT Goals (Current goals can be found in the Care Plan section) Acute Rehab PT Goals Patient Stated Goal: none stated PT Goal Formulation: All assessment and education complete, DC therapy    Frequency     Barriers to discharge        Co-evaluation               End of Session Equipment Utilized During Treatment: Gait belt Activity Tolerance: Patient tolerated treatment well Patient left: in bed;with call bell/phone within reach;with nursing/sitter  in room Nurse Communication: Mobility status    Functional Assessment Tool Used: clinical judgement Functional Limitation: Mobility: Walking and moving around Mobility: Walking and Moving Around Current Status [phone]): 0 percent impaired, limited or restricted Mobility: Walking and Moving Around Goal Status [phone]): 0 percent impaired, limited or restricted Mobility: Walking and Moving Around Discharge Status [phone]): 0 percent impaired, limited or restricted    Time: [id number] PT Time Calculation (min) (ACUTE ONLY): 25 min   Charges:   PT Evaluation $Initial PT Evaluation Tier I: 1 Procedure     PT G Codes:   PT G-Codes **NOT FOR INPATIENT CLASS** Functional Assessment Tool Used: clinical judgement Functional Limitation: Mobility: Walking and moving around Mobility: Walking and Moving Around Current Status [id number]): 0 percent impaired, limited or restricted Mobility: Walking and Moving Around Goal Status [id number]): 0 percent impaired, limited or restricted Mobility: Walking and Moving Around Discharge Status [id number]): 0 percent impaired, limited or restricted    Sable Feil  PT 05/17/2015, 9:40 AM [phone]

## 2015-05-17 NOTE — Consult Note (Signed)
Paul Wood A. Paul Wood     [email or web address]          Paul Wood. is an 81 y.o. male.   ASSESSMENT/PLAN: Acute confusion and worsening dysarthria. Etiology unclear but given the large vessel ischemic stroke that the patient has had previously, patient may be having complex partial seizures. Metabolic issues are concerned but nothing has been uncovered at this time.  An EEG will be obtained. We'll continue with his current dual antiplatelet agents. However, think we ought to look at stop and one of these given increased risk of bleeding complication without benefit long term. Avoid psychotropic medications.  The patient is a 81 year old black male who presents with acute confusion and altered mental status. It appears the patient also has had some difficulty with speech. No focal numbness or weakness is reported no headaches. No chest pain or source of breath. No GI GU symptoms. The workup so far as pain unremarkable. He does have an elevated creatinine but this seems to be at his baseline in reviewing his previous levels over the last few years. Review of systems is limited given the cognitive impairment.  GENERAL: Pleasant man in no acute distress.  HEENT: Supple. Atraumatic normocephalic.   ABDOMEN: soft  EXTREMITIES: No edema   BACK: Normal.  SKIN: Normal by inspection.    MENTAL STATUS: He lays in bed with eyes closed but ohms his eyes to verbal commands. The patient clearly has a receptive aphasia which results in difficulties in communication and following commands. He does have the paraphasic errors and perseveration at times.  CRANIAL NERVES: Pupils are equal, round and reactive to light and accommodation; extra ocular movements are full, there is no significant nystagmus; visual fields are full; upper and lower facial muscles are normal in strength and symmetric, there is no flattening of the nasolabial folds; tongue is midline; uvula is midline;  shoulder elevation is normal.  MOTOR: Normal tone, bulk and strength; no pronator drift.  COORDINATION: Left finger to nose is normal, right finger to nose is normal, No rest tremor; no intention tremor; no postural tremor; no bradykinesia.  REFLEXES: Deep tendon reflexes are symmetrical and normal. Babinski reflexes are flexor bilaterally.   SENSATION: Normal to light touch.   The brain MRI is reviewed in person. No acute findings are observed on diffusion imaging. There is moderate periventricular and deep white matter encephalopathy. There is moderate encephalomalacia associated with laminated changes and the white matter changes involving the left occipital lobe.      [[[[[[[[[[[[[[[[[[[[[[[[[[[[Paul Wood's note 2015 HPI: 35 year African American gentleman who is accompanied by his son who provides most of the history. The patient noticed some peripheral vision loss as well as confusion and difficulty driving for couple of days prior to being admitted to [facility] on 03/05/14. On exam there was found to have right-sided homonymous hemianopsia and CT scan of the brain suggested left occipital infarct. Subsequently an MRI scan of the brain was done which confirmed this. I have personally reviewed his records through care everywhere. Echocardiogram was unremarkable. Carotid Dopplers and Transcranial Doppler studies did not show any large vessel stenosis. Patient was felt to have left PCA infarct due to intracranial atherosclerosis and started on aspirin and Plavix which he seems to be tolerating well with only minor bruising. Patient did have a prior history of stroke 5 years ago in which he had some transient facial weakness which recovered within a few days. He does have multiple vascular factors  in the form of coronary artery disease, diabetes, hypertension, hyperlipidemia, obesity and suspected sleep apnea. He has not yet had a polysomnogram or tried CPAP. He has noticed slight  improvement in his confusion though his memory loss and cognitive difficulties persist as well as peripheral vision loss is yet unchanged.  I had a long discussion with the patient and his son regarding his recent stroke, discussed his neurological deficits on my examination findings and the need for aggressive risk factor modification and answered questions. Continue aspirin 81 mg and Plavix 75 mg daily for 3 months and then discontinue aspirin and stay on Plavix alone after that. Aggressive control of diabetes with insulin A1c goal below 6.5%, lipids with LDL cholesterol goal below 70 mg percent, hypertension with blood pressure goal below 120/80. Polysomnogram for sleep apnea. Check vitamin B12, TSH, RPR and EEG for treatable causes of memory loss. He was  advised not to drive because of significant peripheral vision loss Return for followup in 2 months or earlier if necessary.]]]]]]]]]]]]]]]]]]]]]]]   Blood pressure 150/60, pulse 55, temperature 98.5 F (36.9 C), temperature source Oral, resp. rate 18, height '6\' 3"'  (1.905 m), weight 115.894 kg (255 lb 8 oz), SpO2 100 %.  Past Medical History  Diagnosis Date  . Hypothyroidism   . Mixed hyperlipidemia   . Ventral hernia   . Hearing loss   . Proteinuria     Mild  . Gout   . Hypertrophy of prostate without urinary obstruction and other lower urinary tract symptoms (LUTS)   . PUD (peptic ulcer disease)   . Depression   . Essential hypertension, benign   . Type 2 diabetes mellitus ([place])   . Coronary atherosclerosis of native coronary artery     4/12 - DES mid RCA, DES distal LAD, DES x2 mid LAD  . Incarcerated umbilical hernia   . Stroke [facility])     2008; slight st memory deficirs since last stroke    Past Surgical History  Procedure Laterality Date  . Multiple tooth extractions      HX DENTAL CARIES/ALMOST ALL REMOVED  . Hernia repair  01/04/14    Paul. Lajuan Wood  . Cataract extraction w/phaco Right 08/15/2014    Procedure:  CATARACT EXTRACTION PHACO AND INTRAOCULAR LENS PLACEMENT ([place]);  Surgeon: Paul Wood;  Location: [facility];  Service: Ophthalmology;  Laterality: Right;  CDE:10.77    Family History  Problem Relation Age of Onset  . Gout Father     DIED AGE 65-HOUSE FIRE  . Alzheimer's disease Mother     DIED AGE 30/uncertain hx  . Other Brother     2 brothers deceased:1-gun shot wound & 1-hypothermia(froze to death)  . Colon cancer Sister     36 sisters:1-colon cancer age 43's  . Gout Son     age 23  . Hypertension Son     age 46  . Hyperlipidemia Son     age 35  . Other Daughter     age 34:leg problems,Knee OA    Social History:  reports that he has never smoked. He has never used smokeless tobacco. He reports that he does not drink alcohol or use illicit drugs.  Allergies: No Known Allergies  Medications: Prior to Admission medications   Medication Sig Start Date End Date Taking? Authorizing Provider  allopurinol (ZYLOPRIM) 300 MG tablet Take 300 mg by mouth daily.   Yes Historical Provider, Wood  aspirin EC 81 MG tablet Take 81 mg by mouth daily.  Yes Historical Provider, Wood  atorvastatin (LIPITOR) 80 MG tablet TAKE 1 TABLET BY MOUTH AT BEDTIME ( PT NEEDS OFFICE VISIT) 09/06/14  Yes Satira Sark, Wood  carvedilol (COREG) 12.5 MG tablet TAKE 1 TABLET BY MOUTH TWICE DAILY 05/29/14  Yes Satira Sark, Wood  clopidogrel (PLAVIX) 75 MG tablet TAKE 1 TABLET BY MOUTH EVERY DAY (PATIENT NEEDS OFFICE VISIT) 01/27/14  Yes Satira Sark, Wood  LEVEMIR FLEXTOUCH 100 UNIT/ML Pen Inject 14 Units into the skin daily at 10 pm.  04/24/15  Yes Historical Provider, Wood  levothyroxine (SYNTHROID, LEVOTHROID) 88 MCG tablet Take 88 mcg by mouth daily before breakfast.   Yes Historical Provider, Wood  lisinopril (PRINIVIL,ZESTRIL) 20 MG tablet TAKE 1 TABLET BY MOUTH EVERY DAY (PT NEEDS OFFICE VISIT) 09/06/14  Yes Satira Sark, Wood  metFORMIN (GLUCOPHAGE) 1000 MG tablet Take 1,000 mg by mouth 2 (two) times  daily with a meal.     Yes Historical Provider, Wood  pantoprazole (PROTONIX) 40 MG tablet Take 40 mg by mouth daily.    Yes Historical Provider, Wood  tamsulosin (FLOMAX) 0.4 MG CAPS capsule Take 0.4 mg by mouth daily. 06/27/13  Yes Historical Provider, Wood    Scheduled Meds: . allopurinol  300 mg Oral Daily  . amLODipine  5 mg Oral Daily  . aspirin EC  81 mg Oral Daily  . atorvastatin  80 mg Oral q1800  . carvedilol  12.5 mg Oral BID WC  . clopidogrel  75 mg Oral Daily  . enoxaparin (LOVENOX) injection  30 mg Subcutaneous Q24H  . insulin aspart  0-15 Units Subcutaneous 6 times per day  . insulin glargine  14 Units Subcutaneous QHS  . levothyroxine  88 mcg Oral QAC breakfast  . lisinopril  20 mg Oral Daily  . pantoprazole  40 mg Oral BID  . tamsulosin  0.4 mg Oral Daily   Continuous Infusions: . sodium chloride 50 mL/hr at 05/16/15 2320  . dextrose 5 % and 0.9% NaCl 100 mL/hr at 05/17/15 1100   PRN Meds:.hydrALAZINE     Results for orders placed or performed during the Wood encounter of 05/16/15 (from the past 48 hour(s))  CBG monitoring, ED     Status: None   Collection Time: 05/16/15  4:26 PM  Result Value Ref Range   Glucose-Capillary 94 65 - 99 mg/dL  Ethanol     Status: None   Collection Time: 05/16/15  5:06 PM  Result Value Ref Range   Alcohol, Ethyl (B) <5 <5 mg/dL    Comment:        LOWEST DETECTABLE LIMIT FOR SERUM ALCOHOL IS 5 mg/dL FOR MEDICAL PURPOSES ONLY   Protime-INR     Status: None   Collection Time: 05/16/15  5:06 PM  Result Value Ref Range   Prothrombin Time 13.3 11.6 - 15.2 seconds   INR 0.99 0.00 - 1.49  APTT     Status: None   Collection Time: 05/16/15  5:06 PM  Result Value Ref Range   aPTT 28 24 - 37 seconds  CBC     Status: Abnormal   Collection Time: 05/16/15  5:06 PM  Result Value Ref Range   WBC 4.8 4.0 - 10.5 K/uL   RBC 3.42 (L) 4.22 - 5.81 MIL/uL   Hemoglobin 8.4 (L) 13.0 - 17.0 g/dL   HCT 27.6 (L) 39.0 - 52.0 %   MCV 80.7 78.0  - 100.0 fL   MCH 24.6 (L) 26.0 - 34.0 pg  MCHC 30.4 30.0 - 36.0 g/dL   RDW 18.3 (H) 11.5 - 15.5 %   Platelets 183 150 - 400 K/uL  Differential     Status: None   Collection Time: 05/16/15  5:06 PM  Result Value Ref Range   Neutrophils Relative % 62 %   Neutro Abs 3.0 1.7 - 7.7 K/uL   Lymphocytes Relative 30 %   Lymphs Abs 1.4 0.7 - 4.0 K/uL   Monocytes Relative 6 %   Monocytes Absolute 0.3 0.1 - 1.0 K/uL   Eosinophils Relative 2 %   Eosinophils Absolute 0.1 0.0 - 0.7 K/uL   Basophils Relative 0 %   Basophils Absolute 0.0 0.0 - 0.1 K/uL  Comprehensive metabolic panel     Status: Abnormal   Collection Time: 05/16/15  5:06 PM  Result Value Ref Range   Sodium 142 135 - 145 mmol/L   Potassium 4.5 3.5 - 5.1 mmol/L   Chloride 111 101 - 111 mmol/L   CO2 26 22 - 32 mmol/L   Glucose, Bld 106 (H) 65 - 99 mg/dL   BUN 26 (H) 6 - 20 mg/dL   Creatinine, Ser 1.71 (H) 0.61 - 1.24 mg/dL   Calcium 8.3 (L) 8.9 - 10.3 mg/dL   Total Protein 6.6 6.5 - 8.1 g/dL   Albumin 3.7 3.5 - 5.0 g/dL   AST 20 15 - 41 U/L   ALT 12 (L) 17 - 63 U/L   Alkaline Phosphatase 66 38 - 126 U/L   Total Bilirubin 0.3 0.3 - 1.2 mg/dL   GFR calc non Af Amer 36 (L) >60 mL/min   GFR calc Af Amer 41 (L) >60 mL/min    Comment: (NOTE) The eGFR has been calculated using the CKD EPI equation. This calculation has not been validated in all clinical situations. eGFR's persistently <60 mL/min signify possible Chronic Kidney Disease.    Anion gap 5 5 - 15  Troponin I     Status: None   Collection Time: 05/16/15  5:06 PM  Result Value Ref Range   Troponin I <0.03 <0.031 ng/mL    Comment:        NO INDICATION OF MYOCARDIAL INJURY.   Glucose, capillary     Status: None   Collection Time: 05/16/15  8:30 PM  Result Value Ref Range   Glucose-Capillary 83 65 - 99 mg/dL   Comment 1 Notify RN    Comment 2 Document in Chart   Glucose, capillary     Status: None   Collection Time: 05/16/15 11:21 PM  Result Value Ref Range    Glucose-Capillary 98 65 - 99 mg/dL   Comment 1 Notify RN    Comment 2 Document in Chart   Glucose, capillary     Status: None   Collection Time: 05/17/15  3:58 AM  Result Value Ref Range   Glucose-Capillary 83 65 - 99 mg/dL   Comment 1 Notify RN    Comment 2 Document in Chart   Lipid panel     Status: None   Collection Time: 05/17/15  5:36 AM  Result Value Ref Range   Cholesterol 141 0 - 200 mg/dL   Triglycerides 48 <150 mg/dL   HDL 58 >40 mg/dL   Total CHOL/HDL Ratio 2.4 RATIO   VLDL 10 0 - 40 mg/dL   LDL Cholesterol 73 0 - 99 mg/dL    Comment:        Total Cholesterol/HDL:CHD Risk Coronary Heart Disease Risk Table  Men   Women  1/2 Average Risk   3.4   3.3  Average Risk       5.0   4.4  2 X Average Risk   9.6   7.1  3 X Average Risk  23.4   11.0        Use the calculated Patient Ratio above and the CHD Risk Table to determine the patient's CHD Risk.        ATP III CLASSIFICATION (LDL):  <100     mg/dL   Optimal  100-129  mg/dL   Near or Above                    Optimal  130-159  mg/dL   Borderline  160-189  mg/dL   High  >190     mg/dL   Very High   Sedimentation rate     Status: None   Collection Time: 05/17/15  5:36 AM  Result Value Ref Range   Sed Rate 11 0 - 16 mm/hr  Glucose, capillary     Status: None   Collection Time: 05/17/15  7:15 AM  Result Value Ref Range   Glucose-Capillary 70 65 - 99 mg/dL   Comment 1 Notify RN    Comment 2 Document in Chart   Glucose, capillary     Status: None   Collection Time: 05/17/15  9:39 AM  Result Value Ref Range   Glucose-Capillary 70 65 - 99 mg/dL   Comment 1 Notify RN    Comment 2 Document in Chart   Glucose, capillary     Status: None   Collection Time: 05/17/15 10:28 AM  Result Value Ref Range   Glucose-Capillary 78 65 - 99 mg/dL   Comment 1 Notify RN    Comment 2 Document in Chart   Glucose, capillary     Status: None   Collection Time: 05/17/15 11:05 AM  Result Value Ref Range    Glucose-Capillary 80 65 - 99 mg/dL   Comment 1 Notify RN    Comment 2 Document in Chart   Urine rapid drug screen (hosp performed)not at [facility]     Status: None   Collection Time: 05/17/15 11:51 AM  Result Value Ref Range   Opiates NONE DETECTED NONE DETECTED   Cocaine NONE DETECTED NONE DETECTED   Benzodiazepines NONE DETECTED NONE DETECTED   Amphetamines NONE DETECTED NONE DETECTED   Tetrahydrocannabinol NONE DETECTED NONE DETECTED   Barbiturates NONE DETECTED NONE DETECTED    Comment:        DRUG SCREEN FOR MEDICAL PURPOSES ONLY.  IF CONFIRMATION IS NEEDED FOR ANY PURPOSE, NOTIFY LAB WITHIN 5 DAYS.        LOWEST DETECTABLE LIMITS FOR URINE DRUG SCREEN Drug Class       Cutoff (ng/mL) Amphetamine      1000 Barbiturate      200 Benzodiazepine   466 Tricyclics       599 Opiates          300 Cocaine          300 THC              50   Urinalysis, Routine w reflex microscopic (not at [facility])     Status: None   Collection Time: 05/17/15 11:51 AM  Result Value Ref Range   Color, Urine YELLOW YELLOW   APPearance CLEAR CLEAR   Specific Gravity, Urine 1.015 1.005 - 1.030   pH 7.0 5.0 - 8.0  Glucose, UA NEGATIVE NEGATIVE mg/dL   Hgb urine dipstick NEGATIVE NEGATIVE   Bilirubin Urine NEGATIVE NEGATIVE   Ketones, ur NEGATIVE NEGATIVE mg/dL   Protein, ur NEGATIVE NEGATIVE mg/dL   Urobilinogen, UA 0.2 0.0 - 1.0 mg/dL   Nitrite NEGATIVE NEGATIVE   Leukocytes, UA NEGATIVE NEGATIVE    Comment: MICROSCOPIC NOT DONE ON URINES WITH NEGATIVE PROTEIN, BLOOD, LEUKOCYTES, NITRITE, OR GLUCOSE <1000 mg/dL.  Glucose, capillary     Status: Abnormal   Collection Time: 05/17/15  3:14 PM  Result Value Ref Range   Glucose-Capillary 158 (H) 65 - 99 mg/dL   Comment 1 Notify RN    Comment 2 Document in Chart   Glucose, capillary     Status: Abnormal   Collection Time: 05/17/15  4:04 PM  Result Value Ref Range   Glucose-Capillary 148 (H) 65 - 99 mg/dL   Comment 1 Notify RN    Comment 2 Document in  Chart   Ammonia     Status: None   Collection Time: 05/17/15  5:20 PM  Result Value Ref Range   Ammonia 31 9 - 35 umol/L    Studies/Results: BRAIN MRI  1. No acute intracranial abnormality. 2. Remote lacunar infarcts of the basal ganglia. Some of these are hemorrhagic with remote blood products. 3. Remote encephalomalacia of the left parietal occipital lobe is stable. 4. Stable atrophy and white matter disease.    [place]in[place] Petron [facility], M.D.  Diplomate, Tax adviser of Psychiatry and Neurology ( Neurology). 05/17/2015, 6:27 PM

## 2015-05-17 NOTE — Care Management Note (Signed)
Case Management Note  Patient Details  Name: Paul Wood. MRN: [id number] Date of Birth: 31-Jul-1931  Subjective/Objective:                  Pt admitted with TIA. Pt is from home, lives with wife and has son who lives nearby. Pt is ind at baseline. Pt drove prior to admission. Pt has no DME's or HH services prior to admission. PT/OT recommends 24/7 supervision but no f/u at DC. Pt's wife and son at bedside and confirm pt will have 24/7 supervision at DC. Pt ready to eat and go home.   Action/Plan: Pt plans to DC home with self care. Anticipate DC in next 24 hours. No CM needs anticipated at this time.   Expected Discharge Date:  05/19/15               Expected Discharge Plan:  Home/Self Care  In-House Referral:  NA  Discharge planning Services  CM Consult  Post Acute Care Choice:  NA Choice offered to:  NA  DME Arranged:    DME Agency:     HH Arranged:    HH Agency:     Status of Service:  Completed, signed off  Medicare Important Message Given:    Date Medicare IM Given:    Medicare IM give by:    Date Additional Medicare IM Given:    Additional Medicare Important Message give by:     If discussed at [place] of Stay Meetings, dates discussed:    Additional Comments:  Sherald Barge, RN 05/17/2015, 11:48 AM

## 2015-05-17 NOTE — Evaluation (Signed)
Occupational Therapy Evaluation Patient Details Name: Paul Wood. MRN: [id number] DOB: 1931-10-28 Today's Date: 05/17/2015    History of Present Illness This is an 81 year old man who presented to the hospital in August of this year with symptoms of TIA. He appears to have had a similar event today with sudden onset of altered mental status approximately 7 hours ago. His wife relates that he also has had difficulty with speech in its ability to understand it and also speak it. There is no obvious limb weakness. There is no obvious gait weakness. He was last seen his usual self at 12 noon today. He is now being admitted for further investigation.   Clinical Impression   Pt awake, alert, very pleasant this am. Pt oriented to person and place, unable to state [facility], however knew he was in [place]. Pt with receptive difficulties, able to follow one step commands approximately 75% of the time, with verbal and visual cuing. Pt speech clear, slight if any slurring present. Pt also demonstrates word finding difficulties during evaluation. Pt able to complete bed mobility with mod independence, when asked to doff socks, pt able to reach down and touch socks with ease, did not doff socks due to receptive difficulties. Pt would benefit from continued OT services while in acute care to further assess ability to complete ADL and functional mobility tasks with independence and safety, as well as further assess cognition. Pt family not present to determine level of support and supervision at home.     Follow Up Recommendations  Supervision/Assistance - 24 hour    Equipment Recommendations  None recommended by OT       Precautions / Restrictions Precautions Precautions: None Restrictions Weight Bearing Restrictions: No      Mobility Bed Mobility Overal bed mobility: Modified Independent                        ADL Overall ADL's : Needs assistance/impaired                        Lower Body Dressing Details (indicate cue type and reason): unable to test due to receptive difficulties, pt able to touch feet with no problems               General ADL Comments: Pt is able to complete ADL tasks, however has difficulty with following directions and comprehension due to receptive deficits. Sitter reports pt had no difficulties with toileting process prior to OT arrival.                Pertinent Vitals/Pain Pain Assessment: No/denies pain     Hand Dominance Right   Extremity/Trunk Assessment Upper Extremity Assessment Upper Extremity Assessment: Overall WFL for tasks assessed   Lower Extremity Assessment Lower Extremity Assessment: Defer to PT evaluation       Communication Communication Communication: Receptive difficulties;Expressive difficulties   Cognition Arousal/Alertness: Awake/alert Behavior During Therapy: WFL for tasks assessed/performed Overall Cognitive Status: Impaired/Different from baseline Area of Impairment: Following commands;Safety/judgement;Problem solving       Following Commands: Follows one step commands inconsistently     Problem Solving: Difficulty sequencing;Requires verbal cues;Requires tactile cues General Comments: Pt with receptive difficulties and expressive difficulties, esp regarding word finding difficulties              Home Living Family/patient expects to be discharged to:: Private residence Living Arrangements: Spouse/significant other;Children Available Help at Discharge: Family  Bathroom Shower/Tub: Teacher, early years/pre: Standard     Home Equipment: Cane - single point;Grab bars - tub/shower;Hand held shower head          Prior Functioning/Environment Level of Independence: Independent                OT Problem List: Decreased cognition   OT Treatment/Interventions: Self-care/ADL training;Cognitive  remediation/compensation;Patient/family education    OT Goals(Current goals can be found in the care plan section) Acute Rehab OT Goals Patient Stated Goal: none stated OT Goal Formulation: Patient unable to participate in goal setting Time For Goal Achievement: 05/31/15 Potential to Achieve Goals: Good  OT Frequency: Min 2X/week    End of Session    Activity Tolerance: Patient tolerated treatment well Patient left: in bed;with call bell/phone within reach;with nursing/sitter in room   Time: [id number] OT Time Calculation (min): 14 min Charges:  OT General Charges $OT Visit: 1 Procedure OT Evaluation $Initial OT Evaluation Tier I: 1 Procedure  Guadelupe Sabin, OTR/L  [phone]  05/17/2015, 9:01 AM

## 2015-05-17 NOTE — Progress Notes (Signed)
[facility] PROGRESS NOTE  Paul Wood. [id number] DOB: 13-Oct-1931 DOA: 05/16/2015 PCP: [name], MD  HPI/Brief narrative 81 year old man who presented to the hospital in August of this year with symptoms of TIA. He appears to have had a similar event today with sudden onset of altered mental status approximately 7 hours prior to admit.  Assessment/Plan: 1. Metabolic encephalopathy 1. Much improved since admitted 2. MRI neg for acute infarct 3. Neurology was consulted on admit 4. See below. Pt noted to have markedly elevated BP on admit with sbp well into the 200's. Suspect encephalopathy secondary to malignant HTN. Cont to correct BP as tolerated 2. Accelerated HTN 1. Presenting BP of 212/81 with peak of 228/77 2. On speaking w/ family, strongly suspect pt has been noncompliant with home meds 3. Resume home bp meds and titrate as tolerated 3. DM2 1. On SSI coverage 2. D/c metformin secondary to Cr of 1.7 4. CKD3 1. Cont to monitor renal function 2. On IVF 5. DVT prophylaxis 1. Lovenox subQ  Code Status: Full Family Communication: Pt in room Disposition Plan: Pending   Consultants:    Procedures:    Antibiotics: Anti-infectives    None      HPI/Subjective: Feels better today  Objective: Filed Vitals:   05/17/15 0939 05/17/15 1011 05/17/15 1120 05/17/15 1220  BP: 180/78 175/65 181/71 186/73  Pulse: 50 51 54 55  Temp: 98 F (36.7 C)   98 F (36.7 C)  TempSrc: Oral   Oral  Resp: 18 18 18 18   Height:      Weight:      SpO2: 100% 100% 100% 100%    Intake/Output Summary (Last 24 hours) at 05/17/15 1516 Last data filed at 05/17/15 1200  Gross per 24 hour  Intake  362.5 ml  Output    250 ml  Net  112.5 ml   Filed Weights   05/16/15 2020  Weight: 115.894 kg (255 lb 8 oz)    Exam:   General:  Awake, in nad  Cardiovascular: regular, s1, s2  Respiratory: normal resp effort, no wheezing  Abdomen:  soft,nondistended  Musculoskeletal: perfused, no clubbing   Data Reviewed: Basic Metabolic Panel:  Recent Labs Lab 05/16/15 1706  NA 142  K 4.5  CL 111  CO2 26  GLUCOSE 106*  BUN 26*  CREATININE 1.71*  CALCIUM 8.3*   Liver Function Tests:  Recent Labs Lab 05/16/15 1706  AST 20  ALT 12*  ALKPHOS 66  BILITOT 0.3  PROT 6.6  ALBUMIN 3.7   No results for input(s): LIPASE, AMYLASE in the last 168 hours. No results for input(s): AMMONIA in the last 168 hours. CBC:  Recent Labs Lab 05/16/15 1706  WBC 4.8  NEUTROABS 3.0  HGB 8.4*  HCT 27.6*  MCV 80.7  PLT 183   Cardiac Enzymes:  Recent Labs Lab 05/16/15 1706  TROPONINI <0.03   BNP (last 3 results) No results for input(s): BNP in the last 8760 hours.  ProBNP (last 3 results) No results for input(s): PROBNP in the last 8760 hours.  CBG:  Recent Labs Lab 05/17/15 0358 05/17/15 0715 05/17/15 0939 05/17/15 1028 05/17/15 1105  GLUCAP 83 70 70 78 80    No results found for this or any previous visit (from the past 240 hour(s)).   Studies: Ct Head Wo Contrast  05/16/2015  CLINICAL DATA:  Confusion, last seen normal at noon EXAM: CT HEAD WITHOUT CONTRAST TECHNIQUE: Contiguous axial images were obtained from the base of  the skull through the vertex without intravenous contrast. COMPARISON:  MRI brain dated 02/08/2015.  CT head dated 02/27/2014. FINDINGS: No evidence of parenchymal hemorrhage or extra-axial fluid collection. No mass lesion, mass effect, or midline shift. No CT evidence of acute infarction. Encephalomalacic changes in the left occipital region, related to prior infarct. Intracranial atherosclerosis. Mild cortical atrophy.  No ventriculomegaly. The visualized paranasal sinuses are essentially clear. The mastoid air cells are unopacified. No evidence of calvarial fracture. IMPRESSION: No evidence of acute intracranial abnormality. Encephalomalacic changes related to prior left occipital infarct.  Cortical atrophy. Electronically Signed   By: Julian Hy M.D.   On: 05/16/2015 17:42   [name] Contrast  05/16/2015  CLINICAL DATA:  Confusion.  Personal history of stroke. EXAM: MRI HEAD WITHOUT CONTRAST TECHNIQUE: Multiplanar, multiecho pulse sequences of the brain and surrounding structures were obtained without intravenous contrast. COMPARISON:  CT head from the same day.  MRI brain 12/09/2014. FINDINGS: The diffusion-weighted images demonstrate no evidence for acute or subacute infarction. A remote left parietal and occipital infarct is again seen. Moderate generalized atrophy and diffuse white matter disease is otherwise stable. No acute hemorrhage or mass lesion is present. The ventricles are proportionate to the degree of atrophy. Remote hemorrhagic lacunar infarcts are present in the basal ganglia. Mild white matter changes extend into the brainstem. Flow is present in the major intracranial arteries. Bilateral lens replacements are noted. Globes and orbits are otherwise intact. Mild mucosal thickening is present in the maxillary sinuses bilaterally. The remainder the paranasal sinuses and the mastoid air cells are clear. IMPRESSION: 1. No acute intracranial abnormality. 2. Remote lacunar infarcts of the basal ganglia. Some of these are hemorrhagic with remote blood products. 3. Remote encephalomalacia of the left parietal occipital lobe is stable. 4. Stable atrophy and white matter disease. Electronically Signed   By: San Morelle M.D.   On: 05/16/2015 20:08    Scheduled Meds: . allopurinol  300 mg Oral Daily  . amLODipine  5 mg Oral Daily  . aspirin EC  81 mg Oral Daily  . atorvastatin  80 mg Oral q1800  . carvedilol  12.5 mg Oral BID WC  . clopidogrel  75 mg Oral Daily  . enoxaparin (LOVENOX) injection  30 mg Subcutaneous Q24H  . insulin aspart  0-15 Units Subcutaneous 6 times per day  . insulin glargine  14 Units Subcutaneous QHS  . levothyroxine  88 mcg Oral QAC  breakfast  . lisinopril  20 mg Oral Daily  . pantoprazole  40 mg Oral BID  . tamsulosin  0.4 mg Oral Daily   Continuous Infusions: . sodium chloride 50 mL/hr at 05/16/15 2320  . dextrose 5 % and 0.9% NaCl 100 mL/hr at 05/17/15 1100    Active Problems:   Diabetes mellitus with complication ([place])   Essential hypertension, benign   Chronic renal insufficiency   TIA (transient ischemic attack)    Stepfanie Yott, [facility] Pager [phone]. If 7PM-7AM, please contact night-coverage at [email or web address], password [facility] 05/17/2015, 3:16 PM  LOS: 1 day

## 2015-05-17 NOTE — Evaluation (Signed)
Clinical/Bedside Swallow Evaluation Patient Details  Name: Paul Wood. MRN: [id number] Date of Birth: [birth year]  Today's Date: 05/17/2015 Time: SLP Start Time (ACUTE ONLY): 1315 SLP Stop Time (ACUTE ONLY): 1348 SLP Time Calculation (min) (ACUTE ONLY): 33 min  Past Medical History:  Past Medical History  Diagnosis Date  . Hypothyroidism   . Mixed hyperlipidemia   . Ventral hernia   . Hearing loss   . Proteinuria     Mild  . Gout   . Hypertrophy of prostate without urinary obstruction and other lower urinary tract symptoms (LUTS)   . PUD (peptic ulcer disease)   . Depression   . Essential hypertension, benign   . Type 2 diabetes mellitus ([place])   . Coronary atherosclerosis of native coronary artery     4/12 - DES mid RCA, DES distal LAD, DES x2 mid LAD  . Incarcerated umbilical hernia   . Stroke [facility])     2008; slight st memory deficirs since last stroke   Past Surgical History:  Past Surgical History  Procedure Laterality Date  . Multiple tooth extractions      HX DENTAL CARIES/ALMOST ALL REMOVED  . Hernia repair  01/04/14    Dr. Lajuan Lines  . Cataract extraction w/phaco Right 08/15/2014    Procedure: CATARACT EXTRACTION PHACO AND INTRAOCULAR LENS PLACEMENT ([place]);  Surgeon: Elta Guadeloupe T. Gershon Crane, MD;  Location: [facility];  Service: Ophthalmology;  Laterality: Right;  CDE:10.77   HPI:  Paul Wood is an 81 year old man who presented to the hospital in August of this year with symptoms of TIA. He appears to have had a similar event today with sudden onset of altered mental status approximately 7 hours ago. His wife relates that he also has had difficulty with speech in its ability to understand it and also speak it. There is no obvious limb weakness. There is no obvious gait weakness. MRI showed: No acute intracranial abnormality. Remote lacunar infarcts of the basal ganglia. Some of these are hemorrhagic with remote blood products. Remote encephalomalacia of the left  parietal occipital lobe is stable. Stable atrophy and white matter disease. Pt did not pass RN swallow screen so SLP asked to evaluate swallow at bedside. Family present for evaluation.   Assessment / Plan / Recommendation Clinical Impression  Paul Wood was seen at bedside with son and wife present. Both report that pt is not back to baseline in terms of thinking/memory. Pt able to follow simple 1-step commands 50% of the time, but also perseverative and required visual model at times. Oral motor examination is unremarkable except for sparse upper dentition. Pt shows no signs or symptoms of aspiration at bedside with textures and consistencies presented. Recommend regular textures with thin liquids with intermittent supervision. OK for po medications whole with water. Recommend SLP to follow up for cognitive linguisitic evaluation due to observed language deficits in both comprehension and expression.     Aspiration Risk  No limitations    Diet Recommendation  Regular textures and thin liquids   Medication Administration: Whole meds with liquid    Other  Recommendations Oral Care Recommendations: Oral care BID Other Recommendations: Clarify dietary restrictions   Follow up Recommendations  None    Frequency and Duration  N/A         Swallow Study   General Date of Onset: 05/16/15 HPI: Paul Wood is an 81 year old man who presented to the hospital in August of this year with symptoms of TIA. He appears to have had a  similar event today with sudden onset of altered mental status approximately 7 hours ago. His wife relates that he also has had difficulty with speech in its ability to understand it and also speak it. There is no obvious limb weakness. There is no obvious gait weakness. MRI showed: No acute intracranial abnormality. Remote lacunar infarcts of the basal ganglia. Some of these are hemorrhagic with remote blood products. Remote encephalomalacia of the left parietal occipital lobe  is stable. Stable atrophy and white matter disease. Pt did not pass RN swallow screen so SLP asked to evaluate swallow at bedside. Family present for evaluation. Type of Study: Bedside Swallow Evaluation Diet Prior to this Study: NPO Temperature Spikes Noted: No Respiratory Status: Room air History of Recent Intubation: No Behavior/Cognition: Alert;Cooperative;Pleasant mood Oral Cavity Assessment: Within Functional Limits Oral Care Completed by SLP: No Oral Cavity - Dentition: Adequate natural dentition;Missing dentition Vision: Functional for self-feeding Self-Feeding Abilities: Able to feed self Patient Positioning: Upright in bed Baseline Vocal Quality: Normal Volitional Cough: Strong Volitional Swallow: Able to elicit    Oral/Motor/Sensory Function Overall Oral Motor/Sensory Function: Within functional limits (Pt had difficulty following directions, but able to do with a model)   Ice Chips Ice chips: Within functional limits Presentation: Spoon   Thin Liquid Thin Liquid: Within functional limits Presentation: Cup;Self Fed;Straw    Nectar Thick Nectar Thick Liquid: Not tested   Honey Thick Honey Thick Liquid: Not tested   Puree Puree: Within functional limits Presentation: Spoon   Solid Solid: Within functional limits Presentation: Self Fed      Thank you,  Paul Wood, [place]  Wood,Paul 05/17/2015,2:21 PM

## 2015-05-17 NOTE — Evaluation (Signed)
Speech Language Pathology Evaluation Patient Details Name: Paul Wood. MRN: [id number] DOB: [birth year] Today's Date: 05/17/2015 Time: 2:20 PM - 2:40 PM    Problem List:  Patient Active Problem List   Diagnosis Date Noted  . Accelerated hypertension 05/17/2015  . TIA (transient ischemic attack) 02/08/2015  . CKD (chronic kidney disease), stage III 02/08/2015  . GERD without esophagitis 02/08/2015  . BPH (benign prostatic hyperplasia) 02/08/2015  . Shortness of breath 01/20/2014  . Coronary atherosclerosis of native coronary artery 11/01/2010  . Chronic renal insufficiency 11/01/2010  . HLD (hyperlipidemia) 10/13/2008  . Diabetes mellitus with complication ([place]) [id number]  . Essential hypertension, benign 09/29/2008   Past Medical History:  Past Medical History  Diagnosis Date  . Hypothyroidism   . Mixed hyperlipidemia   . Ventral hernia   . Hearing loss   . Proteinuria     Mild  . Gout   . Hypertrophy of prostate without urinary obstruction and other lower urinary tract symptoms (LUTS)   . PUD (peptic ulcer disease)   . Depression   . Essential hypertension, benign   . Type 2 diabetes mellitus ([place])   . Coronary atherosclerosis of native coronary artery     4/12 - DES mid RCA, DES distal LAD, DES x2 mid LAD  . Incarcerated umbilical hernia   . Stroke [facility])     2008; slight st memory deficirs since last stroke   Past Surgical History:  Past Surgical History  Procedure Laterality Date  . Multiple tooth extractions      HX DENTAL CARIES/ALMOST ALL REMOVED  . Hernia repair  01/04/14    Dr. Lajuan Lines  . Cataract extraction w/phaco Right 08/15/2014    Procedure: CATARACT EXTRACTION PHACO AND INTRAOCULAR LENS PLACEMENT ([place]);  Surgeon: Elta Guadeloupe T. Gershon Crane, MD;  Location: [facility];  Service: Ophthalmology;  Laterality: Right;  CDE:10.77   HPI:  Paul Wood is an 81 year old man who presented to the hospital in August of this year with symptoms of TIA. He  appears to have had a similar event today with sudden onset of altered mental status approximately 7 hours ago. His wife relates that he also has had difficulty with speech in its ability to understand it and also speak it. There is no obvious limb weakness. There is no obvious gait weakness. MRI showed: No acute intracranial abnormality. Remote lacunar infarcts of the basal ganglia. Some of these are hemorrhagic with remote blood products. Remote encephalomalacia of the left parietal occipital lobe is stable. Stable atrophy and white matter disease. Pt did not pass RN swallow screen so SLP asked to evaluate swallow at bedside. Family present for evaluation.   Assessment / Plan / Recommendation Clinical Impression  Paul Wood is a delightful gentleman who is currently presenting with moderate mixed aphasia impacting both expressive and receptive language. He initially appears to follow along in conversation, however deficits become apparent in confrontation naming tasks and when asked to follow commands out of context and without a model. He was unable to tell me the name of his son seated next to him, his own name, his birthday, or what he was eating at the time. He was able to follow 1-step commands initially, however he perseverated beyond three trials. Family frequently verbalized that "he can't remember anything", however deficits appear more related to aphasia. SLP provided information on aphasia and recommendations for follow up speech therapy at home or as an outpatient. MRI was negative for acute infarct, however pt is markedly different from  baseline at this time. He benefited from gestural cues and sticking with one topic at a time. Strongly recommend follow up SLP therapy to address language deficits.    SLP Assessment       Follow Up Recommendations  Home health SLP;24 hour supervision/assistance;Outpatient SLP    Frequency and Duration           SLP Evaluation Prior Functioning   Cognitive/Linguistic Baseline: Within functional limits (per family, however pt has had previous CVAs) Type of Home: House  Lives With: Spouse Available Help at Discharge: Family Education: some high school Vocation: Retired   Associate Professor  Overall Cognitive Status: Impaired/Different from baseline Arousal/Alertness: Awake/alert Orientation Level: Oriented to person (pt with aphasia) Memory: Impaired Memory Impairment: Retrieval deficit Awareness: Impaired Awareness Impairment: Emergent impairment Problem Solving: Impaired Problem Solving Impairment: Verbal complex Executive Function: Initiating;Self Monitoring Behaviors: Perseveration Safety/Judgment: Appears intact    Comprehension  Auditory Comprehension Overall Auditory Comprehension: Impaired Yes/No Questions: Impaired Basic Biographical Questions: 26-50% accurate Basic Immediate Environment Questions: 25-49% accurate Commands: Impaired One Step Basic Commands: 25-49% accurate Conversation: Simple EffectiveTechniques: Pausing;Repetition;Stressing words;Visual/Gestural cues Visual Recognition/Discrimination Discrimination: Not tested Reading Comprehension Reading Status: Unable to assess (comment) (attempted, but no response from pt)    Expression Expression Primary Mode of Expression: Verbal Verbal Expression Overall Verbal Expression: Impaired Initiation: Impaired Automatic Speech: Name (unable to count, repeat days of week) Level of Generative/Spontaneous Verbalization: Phrase Repetition: Impaired Level of Impairment: Word level Naming: Impairment Responsive: 26-50% accurate Confrontation: Impaired Convergent: Not tested Divergent: 0-24% accurate Verbal Errors: Perseveration;Phonemic paraphasias Hydrographic surveyor" for peaches) Pragmatics: No impairment Effective Techniques: Sentence completion;Semantic cues Non-Verbal Means of Communication: Not applicable Written Expression Dominant Hand: Right Written  Expression: Not tested   Oral / Motor Oral Motor/Sensory Function Overall Oral Motor/Sensory Function: Within functional limits Motor Speech Overall Motor Speech: Appears within functional limits for tasks assessed Respiration: Within functional limits Phonation: Normal Resonance: Within functional limits Articulation: Within functional limitis Intelligibility: Intelligible Motor Planning: Witnin functional limits Motor Speech Errors: Not applicable    Thank you,  Genene Churn, [place] 05/17/2015, 6:58 PM

## 2015-05-18 ENCOUNTER — Inpatient Hospital Stay (HOSPITAL_COMMUNITY)
Admit: 2015-05-18 | Discharge: 2015-05-18 | Disposition: A | Discharge status: Home / Self Care | Payer: Medicare HMO | Attending: Neurology | Admitting: Neurology

## 2015-05-18 LAB — BASIC METABOLIC PANEL
Anion gap: 5 (ref 5–15)
BUN: 24 mg/dL — AB (ref 6–20)
CO2: 23 mmol/L (ref 22–32)
Calcium: 8.2 mg/dL — ABNORMAL LOW (ref 8.9–10.3)
Chloride: 111 mmol/L (ref 101–111)
Creatinine, Ser: 1.46 mg/dL — ABNORMAL HIGH (ref 0.61–1.24)
GFR calc Af Amer: 50 mL/min — ABNORMAL LOW (ref >60)
GFR, EST NON AFRICAN AMERICAN: 43 mL/min — AB (ref >60)
GLUCOSE: 106 mg/dL — AB (ref 65–99)
POTASSIUM: 4.5 mmol/L (ref 3.5–5.1)
Sodium: 139 mmol/L (ref 135–145)

## 2015-05-18 LAB — CBC
HEMATOCRIT: 27.3 % — AB (ref 39.0–52.0)
HEMOGLOBIN: 8.7 g/dL — AB (ref 13.0–17.0)
MCH: 25.2 pg — AB (ref 26.0–34.0)
MCHC: 31.9 g/dL (ref 30.0–36.0)
MCV: 79.1 fL (ref 78.0–100.0)
Platelets: 201 10*3/uL (ref 150–400)
RBC: 3.45 MIL/uL — ABNORMAL LOW (ref 4.22–5.81)
RDW: 18.2 % — AB (ref 11.5–15.5)
WBC: 3.9 10*3/uL — ABNORMAL LOW (ref 4.0–10.5)

## 2015-05-18 LAB — GLUCOSE, CAPILLARY
GLUCOSE-CAPILLARY: 105 mg/dL — AB (ref 65–99)
GLUCOSE-CAPILLARY: 86 mg/dL (ref 65–99)
GLUCOSE-CAPILLARY: 158 mg/dL — AB (ref 65–99)
Glucose-Capillary: 149 mg/dL — ABNORMAL HIGH (ref 65–99)
Glucose-Capillary: 170 mg/dL — ABNORMAL HIGH (ref 65–99)
Glucose-Capillary: 99 mg/dL (ref 65–99)

## 2015-05-18 LAB — HEMOGLOBIN A1C
HEMOGLOBIN A1C: 7 % — AB (ref 4.8–5.6)
Mean Plasma Glucose: 154 mg/dL

## 2015-05-18 LAB — TSH: TSH: 12.983 u[IU]/mL — AB (ref 0.350–4.500)

## 2015-05-18 MED ORDER — ASPIRIN 325 MG PO TABS
325.0000 mg | ORAL_TABLET | Freq: Every day | ORAL | Status: DC
Start: 2015-05-18 — End: 2015-05-19
  Administered 2015-05-18 – 2015-05-19 (×2): 325 mg via ORAL
  Filled 2015-05-18 (×2): qty 1

## 2015-05-18 NOTE — Progress Notes (Signed)
[facility] PROGRESS NOTE  Paul Wood. [id number] DOB: 1931/11/29 DOA: 05/16/2015 PCP: [name], MD  HPI/Brief narrative 81 year old man who presented to the hospital in August of this year with symptoms of TIA. He appears to have had a similar event today with sudden onset of altered mental status approximately 7 hours prior to admit.  Assessment/Plan: 1. Confusion, likely secondary to worsening dementia 1. Improved since admitted 2. MRI neg for acute infarct 3. Neurology was consulted on admit with recs for EEG, pending results 4. Case was further discussed with family. Per pt's wife, pt has hx of gradually worsening memory deficits and inability to perform ADL's for over the past 6-7 years (patient would get lost driving down highway, forgetting what to buy in grocery store, unable to cook or clean or bathe). Strongly suspect possible worsening dementia, especially in the setting of known CVA 2. Accelerated HTN 1. Presenting BP of 212/81 with peak of 228/77 2. On speaking w/ family, strongly suspect pt has been noncompliant with home meds 3. Resumed home bp meds and titrate as tolerated 4. BP seems better today 3. DM2 1. On SSI coverage 2. D/c'd metformin secondary to Cr of 1.7 3. GFR now 50, thus, would plan to d/c on metformin on eventual d/c 4. CKD3 1. Cont to monitor renal function 2. On IVF 5. DVT prophylaxis 1. Lovenox subQ while admitted  Code Status: Full Family Communication: Pt in room Disposition Plan: Pending   Consultants:  Neurology  Procedures:  EEG  Antibiotics: Anti-infectives    None      HPI/Subjective: Eager to go home  Objective: Filed Vitals:   05/18/15 0731 05/18/15 0820 05/18/15 1106 05/18/15 1244  BP: 156/72 155/63 172/79 138/63  Pulse: 55 66 66 62  Temp: 97.7 F (36.5 C) 97.4 F (36.3 C) 97.8 F (36.6 C) 97.6 F (36.4 C)  TempSrc: Oral Oral Oral Oral  Resp: 18  18 18   Height:      Weight:      SpO2:  99% 100% 99% 100%    Intake/Output Summary (Last 24 hours) at 05/18/15 1742 Last data filed at 05/18/15 1700  Gross per 24 hour  Intake   1320 ml  Output    400 ml  Net    920 ml   Filed Weights   05/16/15 2020  Weight: 115.894 kg (255 lb 8 oz)    Exam:   General:  Awake, in nad, confused  Cardiovascular: regular, s1, s2  Respiratory: normal resp effort, no wheezing  Abdomen: soft,nondistended, pos BS  Musculoskeletal: perfused, no clubbing   Data Reviewed: Basic Metabolic Panel:  Recent Labs Lab 05/16/15 1706 05/18/15 0603  NA 142 139  K 4.5 4.5  CL 111 111  CO2 26 23  GLUCOSE 106* 106*  BUN 26* 24*  CREATININE 1.71* 1.46*  CALCIUM 8.3* 8.2*   Liver Function Tests:  Recent Labs Lab 05/16/15 1706  AST 20  ALT 12*  ALKPHOS 66  BILITOT 0.3  PROT 6.6  ALBUMIN 3.7   No results for input(s): LIPASE, AMYLASE in the last 168 hours.  Recent Labs Lab 05/17/15 1720  AMMONIA 31   CBC:  Recent Labs Lab 05/16/15 1706 05/18/15 0603  WBC 4.8 3.9*  NEUTROABS 3.0  --   HGB 8.4* 8.7*  HCT 27.6* 27.3*  MCV 80.7 79.1  PLT 183 201   Cardiac Enzymes:  Recent Labs Lab 05/16/15 1706  TROPONINI <0.03   BNP (last 3 results) No results for  input(s): BNP in the last 8760 hours.  ProBNP (last 3 results) No results for input(s): PROBNP in the last 8760 hours.  CBG:  Recent Labs Lab 05/17/15 2045 05/18/15 0022 05/18/15 0401 05/18/15 0728 05/18/15 1104  GLUCAP 114* 149* 105* 99 170*    No results found for this or any previous visit (from the past 240 hour(s)).   Studies: [name] Contrast  05/16/2015  CLINICAL DATA:  Confusion.  Personal history of stroke. EXAM: MRI HEAD WITHOUT CONTRAST TECHNIQUE: Multiplanar, multiecho pulse sequences of the brain and surrounding structures were obtained without intravenous contrast. COMPARISON:  CT head from the same day.  MRI brain 12/09/2014. FINDINGS: The diffusion-weighted images demonstrate no  evidence for acute or subacute infarction. A remote left parietal and occipital infarct is again seen. Moderate generalized atrophy and diffuse white matter disease is otherwise stable. No acute hemorrhage or mass lesion is present. The ventricles are proportionate to the degree of atrophy. Remote hemorrhagic lacunar infarcts are present in the basal ganglia. Mild white matter changes extend into the brainstem. Flow is present in the major intracranial arteries. Bilateral lens replacements are noted. Globes and orbits are otherwise intact. Mild mucosal thickening is present in the maxillary sinuses bilaterally. The remainder the paranasal sinuses and the mastoid air cells are clear. IMPRESSION: 1. No acute intracranial abnormality. 2. Remote lacunar infarcts of the basal ganglia. Some of these are hemorrhagic with remote blood products. 3. Remote encephalomalacia of the left parietal occipital lobe is stable. 4. Stable atrophy and white matter disease. Electronically Signed   By: San Morelle M.D.   On: 05/16/2015 20:08   US Carotid Bilateral  05/17/2015  CLINICAL DATA:  TIA. EXAM: BILATERAL CAROTID DUPLEX ULTRASOUND TECHNIQUE: Pearline Cables scale imaging, color Doppler and duplex ultrasound was performed of bilateral carotid and vertebral arteries in the neck. COMPARISON:  02/09/2015 REVIEW OF SYSTEMS: Quantification of carotid stenosis is based on velocity parameters that correlate the residual internal carotid diameter with NASCET-based stenosis levels, using the diameter of the distal internal carotid lumen as the denominator for stenosis measurement. The following velocity measurements were obtained: PEAK SYSTOLIC/END DIASTOLIC RIGHT ICA:                     98/31cm/sec CCA:                     [id number] SYSTOLIC ICA/CCA RATIO:  1.7 DIASTOLIC ICA/CCA RATIO: 2.8 ECA:                     169cm/sec LEFT ICA:                     91/29cm/sec CCA:                     [id number] SYSTOLIC ICA/CCA RATIO:  1.3  DIASTOLIC ICA/CCA RATIO: 2.0 ECA:                     173cm/sec FINDINGS: RIGHT CAROTID ARTERY: Eccentric plaque in the bulb extending into the proximal ICA with focal areas of calcification. No high-grade stenosis. Normal waveforms and color Doppler signal. RIGHT VERTEBRAL ARTERY:  Normal flow direction and waveform. LEFT CAROTID ARTERY: Intimal thickening through the common carotid artery. Circumferential smooth plaque in the bulb with focal areas of calcification. Scattered plaque in the proximal internal and external carotid arteries. No high-grade stenosis. Distal ICA tortuous. LEFT VERTEBRAL ARTERY: Normal flow direction and  waveform. IMPRESSION: 1. Bilateral carotid bifurcation and proximal ICA plaque, resulting in less than 50% diameter stenosis. Electronically Signed   By: Lucrezia Europe M.D.   On: 05/17/2015 15:58    Scheduled Meds: . allopurinol  300 mg Oral Daily  . amLODipine  5 mg Oral Daily  . aspirin EC  81 mg Oral Daily  . atorvastatin  80 mg Oral q1800  . carvedilol  12.5 mg Oral BID WC  . clopidogrel  75 mg Oral Daily  . enoxaparin (LOVENOX) injection  30 mg Subcutaneous Q24H  . insulin aspart  0-15 Units Subcutaneous 6 times per day  . insulin glargine  14 Units Subcutaneous QHS  . levothyroxine  88 mcg Oral QAC breakfast  . lisinopril  20 mg Oral Daily  . pantoprazole  40 mg Oral BID  . tamsulosin  0.4 mg Oral Daily   Continuous Infusions: . sodium chloride 50 mL/hr at 05/18/15 1202  . dextrose 5 % and 0.9% NaCl 100 mL/hr at 05/17/15 1100    Principal Problem:   Accelerated hypertension Active Problems:   Diabetes mellitus with complication (HCC)   Chronic renal insufficiency   TIA (transient ischemic attack)    Kealohilani Maiorino, [facility] Pager [phone]. If 7PM-7AM, please contact night-coverage at [email or web address], password [facility] 05/18/2015, 5:42 PM  LOS: 2 days

## 2015-05-18 NOTE — Progress Notes (Signed)
EEG Completed; Results Pending  

## 2015-05-18 NOTE — Procedures (Signed)
  Paul Hill [name], MD     [email or web address]           HISTORY: The patient is an 81 year old man who presents with confusion and altered mental status. This study is being done to evaluate for complex partial seizures as a cause of the confusion.  MEDICATIONS: Scheduled Meds: . allopurinol  300 mg Oral Daily  . amLODipine  5 mg Oral Daily  . aspirin EC  81 mg Oral Daily  . atorvastatin  80 mg Oral q1800  . carvedilol  12.5 mg Oral BID WC  . clopidogrel  75 mg Oral Daily  . enoxaparin (LOVENOX) injection  30 mg Subcutaneous Q24H  . insulin aspart  0-15 Units Subcutaneous 6 times per day  . insulin glargine  14 Units Subcutaneous QHS  . levothyroxine  88 mcg Oral QAC breakfast  . lisinopril  20 mg Oral Daily  . pantoprazole  40 mg Oral BID  . tamsulosin  0.4 mg Oral Daily   Continuous Infusions: . sodium chloride 50 mL/hr at 05/18/15 1202  . dextrose 5 % and 0.9% NaCl 100 mL/hr at 05/17/15 1100   PRN Meds:.hydrALAZINE  Prior to Admission medications   Medication Sig Start Date End Date Taking? Authorizing Provider  allopurinol (ZYLOPRIM) 300 MG tablet Take 300 mg by mouth daily.   Yes Historical Provider, MD  aspirin EC 81 MG tablet Take 81 mg by mouth daily.   Yes Historical Provider, MD  atorvastatin (LIPITOR) 80 MG tablet TAKE 1 TABLET BY MOUTH AT BEDTIME ( PT NEEDS OFFICE VISIT) 09/06/14  Yes Satira Sark, MD  carvedilol (COREG) 12.5 MG tablet TAKE 1 TABLET BY MOUTH TWICE DAILY 05/29/14  Yes Satira Sark, MD  clopidogrel (PLAVIX) 75 MG tablet TAKE 1 TABLET BY MOUTH EVERY DAY (PATIENT NEEDS OFFICE VISIT) 01/27/14  Yes Satira Sark, MD  LEVEMIR FLEXTOUCH 100 UNIT/ML Pen Inject 14 Units into the skin daily at 10 pm.  04/24/15  Yes Historical Provider, MD  levothyroxine (SYNTHROID, LEVOTHROID) 88 MCG tablet Take 88 mcg by mouth daily before breakfast.   Yes Historical Provider, MD  lisinopril (PRINIVIL,ZESTRIL) 20 MG tablet TAKE 1 TABLET  BY MOUTH EVERY DAY (PT NEEDS OFFICE VISIT) 09/06/14  Yes Satira Sark, MD  metFORMIN (GLUCOPHAGE) 1000 MG tablet Take 1,000 mg by mouth 2 (two) times daily with a meal.     Yes Historical Provider, MD  pantoprazole (PROTONIX) 40 MG tablet Take 40 mg by mouth daily.    Yes Historical Provider, MD  tamsulosin (FLOMAX) 0.4 MG CAPS capsule Take 0.4 mg by mouth daily. 06/27/13  Yes Historical Provider, MD      ANALYSIS: A 16 channel recording using standard 10 20 measurements is conducted for 22 minutes. There is a well-formed posterior dominant rhythm of 8 Hz which attenuates with eye opening. There is beta activity are generally frontal areas. Awake and sleep activities are observed. K complexes and sleep spindles are recorded. Photic stimulation and hyperventilation are not carried out. There is no focal or lateralized slowing. There is no epileptiform activity is observed.   IMPRESSION: This is a normal recording of the awake and asleep states.      [name] A. [name], M.D.  Diplomate, Tax adviser of Psychiatry and Neurology ( Neurology).

## 2015-05-18 NOTE — Care Management Important Message (Signed)
Important Message  Patient Details  Name: Paul Wood. MRN: [id number] Date of Birth: 1931-10-31   Medicare Important Message Given:  Yes    Joylene Draft, RN 05/18/2015, 9:20 AM

## 2015-05-18 NOTE — Progress Notes (Addendum)
Patient ID: Paul Velaquez., male   DOB: [birth year], 81 y.o.   MRN: [id number]  [place] [name], MD     [email or web address]          Paul Pavey. is an 81 y.o. male.   Assessment/Plan: Acute confusion and worsening dysarthria. Etiology unclear but given the large vessel ischemic stroke that the patient has had previously, patient may be having complex partial seizures. Metabolic issues are concerned but nothing has been uncovered at this time.We'll continue with his current dual antiplatelet agents. However, think we ought to look at stop and one of these given increased risk of bleeding complication without benefit long term. Avoid psychotropic medications.  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       No complaints reported. He seems improved. He is anxious to return home.  GENERAL: Pleasant man in no acute distress.  HEENT: Supple. Atraumatic normocephalic.   ABDOMEN: soft  EXTREMITIES: No edema  BACK: Normal.  SKIN: Normal by inspection.   MENTAL STATUS: He lays in bed with eyes closed but ohms his eyes to verbal commands. The patient's receptive aphasia is better.   CRANIAL NERVES: Pupils are equal, round and reactive to light and accommodation; extra ocular movements are full, there is no significant nystagmus; visual fields are full; upper and lower facial muscles are normal in strength and symmetric, there is no flattening of the nasolabial folds; tongue is midline; uvula is midline; shoulder elevation is normal.  MOTOR: Normal tone, bulk and strength; no pronator drift.  COORDINATION: Left finger to nose is normal, right finger to nose is normal, No rest tremor; no intention tremor; no postural tremor; no bradykinesia.  EEG normal.    Objective: Vital signs in last 24 hours: Temp:  [97.3 F (36.3 C)-98.7 F (37.1 C)] 97.6 F (36.4 C) (11/11 1820) Pulse Rate:  [55-74] 58 (11/11 1820) Resp:  [17-20] 18 (11/11 1820) BP:  (138-178)/(55-79) 178/74 mmHg (11/11 1820) SpO2:  [97 %-100 %] 100 % (11/11 1820)  Intake/Output from previous day: 11/10 0701 - 11/11 0700 In: 1450.8 [P.O.:480; I.V.:970.8] Out: 1050 [Urine:1050] Intake/Output this shift:   Nutritional status: Diet regular Room service appropriate?: Yes; Fluid consistency:: Thin   Lab Results: Results for orders placed or performed during the hospital encounter of 05/16/15 (from the past 48 hour(s))  Glucose, capillary     Status: None   Collection Time: 05/16/15  8:30 PM  Result Value Ref Range   Glucose-Capillary 83 65 - 99 mg/dL   Comment 1 Notify RN    Comment 2 Document in Chart   Glucose, capillary     Status: None   Collection Time: 05/16/15 11:21 PM  Result Value Ref Range   Glucose-Capillary 98 65 - 99 mg/dL   Comment 1 Notify RN    Comment 2 Document in Chart   Glucose, capillary     Status: None   Collection Time: 05/17/15  3:58 AM  Result Value Ref Range   Glucose-Capillary 83 65 - 99 mg/dL   Comment 1 Notify RN    Comment 2 Document in Chart   Hemoglobin A1c     Status: Abnormal   Collection Time: 05/17/15  5:36 AM  Result Value Ref Range   Hgb A1c MFr Bld 7.0 (H) 4.8 - 5.6 %    Comment: (NOTE)         Pre-diabetes: 5.7 - 6.4         Diabetes: >6.4  Glycemic control for adults with diabetes: <7.0    Mean Plasma Glucose 154 mg/dL    Comment: (NOTE) Performed At: [facility] [place], [place] [id number] Lindon Romp MD [id number]   Lipid panel     Status: None   Collection Time: 05/17/15  5:36 AM  Result Value Ref Range   Cholesterol 141 0 - 200 mg/dL   Triglycerides 48 <150 mg/dL   HDL 58 >40 mg/dL   Total CHOL/HDL Ratio 2.4 RATIO   VLDL 10 0 - 40 mg/dL   LDL Cholesterol 73 0 - 99 mg/dL    Comment:        Total Cholesterol/HDL:CHD Risk Coronary Heart Disease Risk Table                     Men   Women  1/2 Average Risk   3.4   3.3  Average Risk       5.0   4.4  2 X Average  Risk   9.6   7.1  3 X Average Risk  23.4   11.0        Use the calculated Patient Ratio above and the CHD Risk Table to determine the patient's CHD Risk.        ATP III CLASSIFICATION (LDL):  <100     mg/dL   Optimal  100-129  mg/dL   Near or Above                    Optimal  130-159  mg/dL   Borderline  160-189  mg/dL   High  >190     mg/dL   Very High   Sedimentation rate     Status: None   Collection Time: 05/17/15  5:36 AM  Result Value Ref Range   Sed Rate 11 0 - 16 mm/hr  Glucose, capillary     Status: None   Collection Time: 05/17/15  7:15 AM  Result Value Ref Range   Glucose-Capillary 70 65 - 99 mg/dL   Comment 1 Notify RN    Comment 2 Document in Chart   Glucose, capillary     Status: None   Collection Time: 05/17/15  9:39 AM  Result Value Ref Range   Glucose-Capillary 70 65 - 99 mg/dL   Comment 1 Notify RN    Comment 2 Document in Chart   Glucose, capillary     Status: None   Collection Time: 05/17/15 10:28 AM  Result Value Ref Range   Glucose-Capillary 78 65 - 99 mg/dL   Comment 1 Notify RN    Comment 2 Document in Chart   Glucose, capillary     Status: None   Collection Time: 05/17/15 11:05 AM  Result Value Ref Range   Glucose-Capillary 80 65 - 99 mg/dL   Comment 1 Notify RN    Comment 2 Document in Chart   Urine rapid drug screen (hosp performed)not at [facility]     Status: None   Collection Time: 05/17/15 11:51 AM  Result Value Ref Range   Opiates NONE DETECTED NONE DETECTED   Cocaine NONE DETECTED NONE DETECTED   Benzodiazepines NONE DETECTED NONE DETECTED   Amphetamines NONE DETECTED NONE DETECTED   Tetrahydrocannabinol NONE DETECTED NONE DETECTED   Barbiturates NONE DETECTED NONE DETECTED    Comment:        DRUG SCREEN FOR MEDICAL PURPOSES ONLY.  IF CONFIRMATION IS NEEDED FOR ANY PURPOSE, NOTIFY LAB WITHIN 5 DAYS.  LOWEST DETECTABLE LIMITS FOR URINE DRUG SCREEN Drug Class       Cutoff (ng/mL) Amphetamine      1000 Barbiturate       200 Benzodiazepine   932 Tricyclics       355 Opiates          300 Cocaine          300 THC              50   Urinalysis, Routine w reflex microscopic (not at [facility])     Status: None   Collection Time: 05/17/15 11:51 AM  Result Value Ref Range   Color, Urine YELLOW YELLOW   APPearance CLEAR CLEAR   Specific Gravity, Urine 1.015 1.005 - 1.030   pH 7.0 5.0 - 8.0   Glucose, UA NEGATIVE NEGATIVE mg/dL   Hgb urine dipstick NEGATIVE NEGATIVE   Bilirubin Urine NEGATIVE NEGATIVE   Ketones, ur NEGATIVE NEGATIVE mg/dL   Protein, ur NEGATIVE NEGATIVE mg/dL   Urobilinogen, UA 0.2 0.0 - 1.0 mg/dL   Nitrite NEGATIVE NEGATIVE   Leukocytes, UA NEGATIVE NEGATIVE    Comment: MICROSCOPIC NOT DONE ON URINES WITH NEGATIVE PROTEIN, BLOOD, LEUKOCYTES, NITRITE, OR GLUCOSE <1000 mg/dL.  Glucose, capillary     Status: Abnormal   Collection Time: 05/17/15  3:14 PM  Result Value Ref Range   Glucose-Capillary 158 (H) 65 - 99 mg/dL   Comment 1 Notify RN    Comment 2 Document in Chart   Glucose, capillary     Status: Abnormal   Collection Time: 05/17/15  4:04 PM  Result Value Ref Range   Glucose-Capillary 148 (H) 65 - 99 mg/dL   Comment 1 Notify RN    Comment 2 Document in Chart   Ammonia     Status: None   Collection Time: 05/17/15  5:20 PM  Result Value Ref Range   Ammonia 31 9 - 35 umol/L  Vitamin B12     Status: Abnormal   Collection Time: 05/17/15  5:20 PM  Result Value Ref Range   Vitamin B-12 179 (L) 180 - 914 pg/mL    Comment: (NOTE) This assay is not validated for testing neonatal or myeloproliferative syndrome specimens for Vitamin B12 levels. Performed at [facility]   Glucose, capillary     Status: Abnormal   Collection Time: 05/17/15  8:45 PM  Result Value Ref Range   Glucose-Capillary 114 (H) 65 - 99 mg/dL  Glucose, capillary     Status: Abnormal   Collection Time: 05/18/15 12:22 AM  Result Value Ref Range   Glucose-Capillary 149 (H) 65 - 99 mg/dL  Glucose, capillary      Status: Abnormal   Collection Time: 05/18/15  4:01 AM  Result Value Ref Range   Glucose-Capillary 105 (H) 65 - 99 mg/dL  Basic metabolic panel     Status: Abnormal   Collection Time: 05/18/15  6:03 AM  Result Value Ref Range   Sodium 139 135 - 145 mmol/L   Potassium 4.5 3.5 - 5.1 mmol/L   Chloride 111 101 - 111 mmol/L   CO2 23 22 - 32 mmol/L   Glucose, Bld 106 (H) 65 - 99 mg/dL   BUN 24 (H) 6 - 20 mg/dL   Creatinine, Ser 1.46 (H) 0.61 - 1.24 mg/dL   Calcium 8.2 (L) 8.9 - 10.3 mg/dL   GFR calc non Af Amer 43 (L) >60 mL/min   GFR calc Af Amer 50 (L) >60 mL/min    Comment: (  NOTE) The eGFR has been calculated using the CKD EPI equation. This calculation has not been validated in all clinical situations. eGFR's persistently <60 mL/min signify possible Chronic Kidney Disease.    Anion gap 5 5 - 15  CBC     Status: Abnormal   Collection Time: 05/18/15  6:03 AM  Result Value Ref Range   WBC 3.9 (L) 4.0 - 10.5 K/uL   RBC 3.45 (L) 4.22 - 5.81 MIL/uL   Hemoglobin 8.7 (L) 13.0 - 17.0 g/dL   HCT 27.3 (L) 39.0 - 52.0 %   MCV 79.1 78.0 - 100.0 fL   MCH 25.2 (L) 26.0 - 34.0 pg   MCHC 31.9 30.0 - 36.0 g/dL   RDW 18.2 (H) 11.5 - 15.5 %   Platelets 201 150 - 400 K/uL  TSH     Status: Abnormal   Collection Time: 05/18/15  6:03 AM  Result Value Ref Range   TSH 12.983 (H) 0.350 - 4.500 uIU/mL  Glucose, capillary     Status: None   Collection Time: 05/18/15  7:28 AM  Result Value Ref Range   Glucose-Capillary 99 65 - 99 mg/dL  Glucose, capillary     Status: Abnormal   Collection Time: 05/18/15 11:04 AM  Result Value Ref Range   Glucose-Capillary 170 (H) 65 - 99 mg/dL  Glucose, capillary     Status: None   Collection Time: 05/18/15  5:05 PM  Result Value Ref Range   Glucose-Capillary 86 65 - 99 mg/dL   Comment 1 Notify RN    Comment 2 Document in Chart     Lipid Panel  Recent Labs  05/17/15 0536  CHOL 141  TRIG 48  HDL 58  CHOLHDL 2.4  VLDL 10  LDLCALC 73     Studies/Results:   Medications:  Scheduled Meds: . allopurinol  300 mg Oral Daily  . amLODipine  5 mg Oral Daily  . aspirin EC  81 mg Oral Daily  . atorvastatin  80 mg Oral q1800  . carvedilol  12.5 mg Oral BID WC  . clopidogrel  75 mg Oral Daily  . enoxaparin (LOVENOX) injection  30 mg Subcutaneous Q24H  . insulin aspart  0-15 Units Subcutaneous 6 times per day  . insulin glargine  14 Units Subcutaneous QHS  . levothyroxine  88 mcg Oral QAC breakfast  . lisinopril  20 mg Oral Daily  . pantoprazole  40 mg Oral BID  . tamsulosin  0.4 mg Oral Daily   Continuous Infusions: . sodium chloride 50 mL/hr at 05/18/15 1202  . dextrose 5 % and 0.9% NaCl 100 mL/hr at 05/17/15 1100   PRN Meds:.hydrALAZINE     LOS: 2 days   Siera Beyersdorf [name], M.D.  Diplomate, Tax adviser of Psychiatry and Neurology ( Neurology).

## 2015-05-19 LAB — GLUCOSE, CAPILLARY
GLUCOSE-CAPILLARY: 108 mg/dL — AB (ref 65–99)
GLUCOSE-CAPILLARY: 114 mg/dL — AB (ref 65–99)
Glucose-Capillary: 131 mg/dL — ABNORMAL HIGH (ref 65–99)
Glucose-Capillary: 136 mg/dL — ABNORMAL HIGH (ref 65–99)

## 2015-05-19 MED ORDER — LEVOTHYROXINE SODIUM 100 MCG PO TABS: 100.0000 ug | ORAL_TABLET | Freq: Every day | ORAL | Status: DC

## 2015-05-19 MED ORDER — ACETAMINOPHEN 325 MG PO TABS: 650.0000 mg | ORAL_TABLET | Freq: Four times a day (QID) | ORAL | Status: DC | PRN

## 2015-05-19 MED ORDER — ENOXAPARIN SODIUM 40 MG/0.4ML ~~LOC~~ SOLN: 40.0000 mg | SUBCUTANEOUS | Status: DC

## 2015-05-19 MED ORDER — AMLODIPINE BESYLATE 5 MG PO TABS: 5.0000 mg | ORAL_TABLET | Freq: Every day | ORAL | Status: AC

## 2015-05-19 NOTE — Discharge Summary (Signed)
Physician Discharge Summary  Paul Wood. [id number] DOB: October 13, 1931 DOA: 05/16/2015  PCP: [name], MD  Admit date: 05/16/2015 Discharge date: 05/19/2015  Time spent: 20 minutes  Recommendations for Outpatient Follow-up:  1. Follow up with PCP in 1-2 weeks 2. Would repeat TSH in 4-6 weeks   Discharge Diagnoses:  Principal Problem:   Accelerated hypertension Active Problems:   Diabetes mellitus with complication ([place])   Chronic renal insufficiency   TIA (transient ischemic attack)   Discharge Condition: Stable  Diet recommendation: Diabetic, heart healthy  Filed Weights   05/16/15 2020  Weight: 115.894 kg (255 lb 8 oz)    History of present illness:  Please review dictated H and P from 11/9 for details. Briefly, 81 year old man who presented to the hospital in August of this year with symptoms of TIA. He appears to have had a similar event on the day of admission with reportedly sudden onset of altered mental status approximately 7 hours prior to admit.  Hospital Course:  1. Confusion, likely secondary to worsening dementia and/or hypothyroid 1. Clinically improved since admitted 2. MRI was neg for acute infarct 3. Neurology was consulted on admit with recs for EEG, found to have normal findings 4. Case was further discussed with family. Per pt's wife, pt has hx of gradually worsening memory deficits and inability to perform ADL's for over the past 6-7 years (patient would get lost driving down highway, forgetting what to buy in grocery store, unable to cook or clean or bathe reliably). Strongly suspect possible worsening dementia, especially in the setting of known CVA 5. In addition, pt also found to be hypothyroid with TSH just under 13, which may also contribute to mental confusion 2. Accelerated HTN 1. Presenting BP of 212/81 with peak of 228/77 2. On speaking w/ family, strongly suspect pt has been noncompliant with home meds 3. Resumed home bp meds and  titrate as tolerated 4. BP seems better today 3. DM2 1. On SSI coverage 2. D/c'd metformin secondary to Cr of 1.7 3. GFR now 50, thus, would plan to d/c on metformin on eventual d/c 4. CKD3 1. Improved with IVF 5. DVT prophylaxis 1. Lovenox subQ while admitted 6. Hypothyroid 1. TSH just under 13 2. Suspect medication noncompliance 3. Will increase levothyroxine dose from 23mcg to 160mcg daily 4. Would recommend TSH to be repeated in 4-6 weeks  Procedures:  EEG  Consultations:  Neurology  Discharge Exam: Filed Vitals:   05/19/15 0220 05/19/15 0620 05/19/15 0804 05/19/15 1002  BP: 178/69 164/76 171/62 156/65  Pulse: 52 60 54 60  Temp: 98.2 F (36.8 C) 97.8 F (36.6 C)    TempSrc: Oral Oral    Resp: 18 18    Height:      Weight:      SpO2: 100% 98%  100%    General: awake, in nad Cardiovascular: regular, s1, s2 Respiratory: normal resp effort, no wheezing  Discharge Instructions     Medication List    ASK your doctor about these medications        allopurinol 300 MG tablet  Commonly known as:  ZYLOPRIM  Take 300 mg by mouth daily.     aspirin EC 81 MG tablet  Take 81 mg by mouth daily.     atorvastatin 80 MG tablet  Commonly known as:  LIPITOR  TAKE 1 TABLET BY MOUTH AT BEDTIME ( PT NEEDS OFFICE VISIT)     carvedilol 12.5 MG tablet  Commonly known as:  COREG  TAKE 1 TABLET BY MOUTH TWICE DAILY     clopidogrel 75 MG tablet  Commonly known as:  PLAVIX  TAKE 1 TABLET BY MOUTH EVERY DAY (PATIENT NEEDS OFFICE VISIT)     LEVEMIR FLEXTOUCH 100 UNIT/ML Pen  Generic drug:  Insulin Detemir  Inject 14 Units into the skin daily at 10 pm.     levothyroxine 88 MCG tablet  Commonly known as:  SYNTHROID, LEVOTHROID  Take 88 mcg by mouth daily before breakfast.     lisinopril 20 MG tablet  Commonly known as:  PRINIVIL,ZESTRIL  TAKE 1 TABLET BY MOUTH EVERY DAY (PT NEEDS OFFICE VISIT)     metFORMIN 1000 MG tablet  Commonly known as:  GLUCOPHAGE  Take  1,000 mg by mouth 2 (two) times daily with a meal.     pantoprazole 40 MG tablet  Commonly known as:  PROTONIX  Take 40 mg by mouth daily.     tamsulosin 0.4 MG Caps capsule  Commonly known as:  FLOMAX  Take 0.4 mg by mouth daily.       No Known Allergies    The results of significant diagnostics from this hospitalization (including imaging, microbiology, ancillary and laboratory) are listed below for reference.    Significant Diagnostic Studies: Ct Head Wo Contrast  05/16/2015  CLINICAL DATA:  Confusion, last seen normal at noon EXAM: CT HEAD WITHOUT CONTRAST TECHNIQUE: Contiguous axial images were obtained from the base of the skull through the vertex without intravenous contrast. COMPARISON:  MRI brain dated 02/08/2015.  CT head dated 02/27/2014. FINDINGS: No evidence of parenchymal hemorrhage or extra-axial fluid collection. No mass lesion, mass effect, or midline shift. No CT evidence of acute infarction. Encephalomalacic changes in the left occipital region, related to prior infarct. Intracranial atherosclerosis. Mild cortical atrophy.  No ventriculomegaly. The visualized paranasal sinuses are essentially clear. The mastoid air cells are unopacified. No evidence of calvarial fracture. IMPRESSION: No evidence of acute intracranial abnormality. Encephalomalacic changes related to prior left occipital infarct. Cortical atrophy. Electronically Signed   By: Julian Hy M.D.   On: 05/16/2015 17:42   [name] Contrast  05/16/2015  CLINICAL DATA:  Confusion.  Personal history of stroke. EXAM: MRI HEAD WITHOUT CONTRAST TECHNIQUE: Multiplanar, multiecho pulse sequences of the brain and surrounding structures were obtained without intravenous contrast. COMPARISON:  CT head from the same day.  MRI brain 12/09/2014. FINDINGS: The diffusion-weighted images demonstrate no evidence for acute or subacute infarction. A remote left parietal and occipital infarct is again seen. Moderate generalized  atrophy and diffuse white matter disease is otherwise stable. No acute hemorrhage or mass lesion is present. The ventricles are proportionate to the degree of atrophy. Remote hemorrhagic lacunar infarcts are present in the basal ganglia. Mild white matter changes extend into the brainstem. Flow is present in the major intracranial arteries. Bilateral lens replacements are noted. Globes and orbits are otherwise intact. Mild mucosal thickening is present in the maxillary sinuses bilaterally. The remainder the paranasal sinuses and the mastoid air cells are clear. IMPRESSION: 1. No acute intracranial abnormality. 2. Remote lacunar infarcts of the basal ganglia. Some of these are hemorrhagic with remote blood products. 3. Remote encephalomalacia of the left parietal occipital lobe is stable. 4. Stable atrophy and white matter disease. Electronically Signed   By: San Morelle M.D.   On: 05/16/2015 20:08   US Carotid Bilateral  05/17/2015  CLINICAL DATA:  TIA. EXAM: BILATERAL CAROTID DUPLEX ULTRASOUND TECHNIQUE: Pearline Cables scale imaging, color Doppler and duplex  ultrasound was performed of bilateral carotid and vertebral arteries in the neck. COMPARISON:  02/09/2015 REVIEW OF SYSTEMS: Quantification of carotid stenosis is based on velocity parameters that correlate the residual internal carotid diameter with NASCET-based stenosis levels, using the diameter of the distal internal carotid lumen as the denominator for stenosis measurement. The following velocity measurements were obtained: PEAK SYSTOLIC/END DIASTOLIC RIGHT ICA:                     98/31cm/sec CCA:                     [id number] SYSTOLIC ICA/CCA RATIO:  1.7 DIASTOLIC ICA/CCA RATIO: 2.8 ECA:                     169cm/sec LEFT ICA:                     91/29cm/sec CCA:                     [id number] SYSTOLIC ICA/CCA RATIO:  1.3 DIASTOLIC ICA/CCA RATIO: 2.0 ECA:                     173cm/sec FINDINGS: RIGHT CAROTID ARTERY: Eccentric plaque in the bulb  extending into the proximal ICA with focal areas of calcification. No high-grade stenosis. Normal waveforms and color Doppler signal. RIGHT VERTEBRAL ARTERY:  Normal flow direction and waveform. LEFT CAROTID ARTERY: Intimal thickening through the common carotid artery. Circumferential smooth plaque in the bulb with focal areas of calcification. Scattered plaque in the proximal internal and external carotid arteries. No high-grade stenosis. Distal ICA tortuous. LEFT VERTEBRAL ARTERY: Normal flow direction and waveform. IMPRESSION: 1. Bilateral carotid bifurcation and proximal ICA plaque, resulting in less than 50% diameter stenosis. Electronically Signed   By: Lucrezia Europe M.D.   On: 05/17/2015 15:58    Microbiology: No results found for this or any previous visit (from the past 240 hour(s)).   Labs: Basic Metabolic Panel:  Recent Labs Lab 05/16/15 1706 05/18/15 0603  NA 142 139  K 4.5 4.5  CL 111 111  CO2 26 23  GLUCOSE 106* 106*  BUN 26* 24*  CREATININE 1.71* 1.46*  CALCIUM 8.3* 8.2*   Liver Function Tests:  Recent Labs Lab 05/16/15 1706  AST 20  ALT 12*  ALKPHOS 66  BILITOT 0.3  PROT 6.6  ALBUMIN 3.7   No results for input(s): LIPASE, AMYLASE in the last 168 hours.  Recent Labs Lab 05/17/15 1720  AMMONIA 31   CBC:  Recent Labs Lab 05/16/15 1706 05/18/15 0603  WBC 4.8 3.9*  NEUTROABS 3.0  --   HGB 8.4* 8.7*  HCT 27.6* 27.3*  MCV 80.7 79.1  PLT 183 201   Cardiac Enzymes:  Recent Labs Lab 05/16/15 1706  TROPONINI <0.03   BNP: BNP (last 3 results) No results for input(s): BNP in the last 8760 hours.  ProBNP (last 3 results) No results for input(s): PROBNP in the last 8760 hours.  CBG:  Recent Labs Lab 05/18/15 1705 05/18/15 2023 05/19/15 0013 05/19/15 0435 05/19/15 0724  GLUCAP 86 158* 108* 114* 131*    Signed:  Darshawn Boateng K  [facility] 05/19/2015, 10:13 AM

## 2015-05-19 NOTE — Progress Notes (Addendum)
CM communicated with family and patient regarding [place] services.  The last time patient had services with agency was in 2015 and want to use [facility] again.  CM contacted agency and faxed paperwork for processing.  Contacted staff to inform he information is forwarded to [facility] and patient has used services before.  No other services needed at this time. CM received a fax from [facility] about the address for patient.  Communicated the patient had services with [facility] before and he is in the system for them.  CM provided the address that is on the facesheet of [place].  Information to be forwarded to Pecolia Ades at [facility].

## 2015-05-19 NOTE — Progress Notes (Signed)
Received verbal order from MD to discontinue neuro checks.

## 2015-05-21 DIAGNOSIS — R69 Illness, unspecified: Secondary | ICD-10-CM | POA: Diagnosis not present

## 2015-05-21 DIAGNOSIS — E039 Hypothyroidism, unspecified: Secondary | ICD-10-CM | POA: Diagnosis not present

## 2015-05-21 DIAGNOSIS — Z7984 Long term (current) use of oral hypoglycemic drugs: Secondary | ICD-10-CM | POA: Diagnosis not present

## 2015-05-21 DIAGNOSIS — N183 Chronic kidney disease, stage 3 (moderate): Secondary | ICD-10-CM | POA: Diagnosis not present

## 2015-05-21 DIAGNOSIS — I129 Hypertensive chronic kidney disease with stage 1 through stage 4 chronic kidney disease, or unspecified chronic kidney disease: Secondary | ICD-10-CM | POA: Diagnosis not present

## 2015-05-21 DIAGNOSIS — E1122 Type 2 diabetes mellitus with diabetic chronic kidney disease: Secondary | ICD-10-CM | POA: Diagnosis not present

## 2015-05-21 DIAGNOSIS — Z8673 Personal history of transient ischemic attack (TIA), and cerebral infarction without residual deficits: Secondary | ICD-10-CM | POA: Diagnosis not present

## 2015-05-22 DIAGNOSIS — Z8673 Personal history of transient ischemic attack (TIA), and cerebral infarction without residual deficits: Secondary | ICD-10-CM | POA: Diagnosis not present

## 2015-05-22 DIAGNOSIS — E1122 Type 2 diabetes mellitus with diabetic chronic kidney disease: Secondary | ICD-10-CM | POA: Diagnosis not present

## 2015-05-22 DIAGNOSIS — I129 Hypertensive chronic kidney disease with stage 1 through stage 4 chronic kidney disease, or unspecified chronic kidney disease: Secondary | ICD-10-CM | POA: Diagnosis not present

## 2015-05-22 DIAGNOSIS — E039 Hypothyroidism, unspecified: Secondary | ICD-10-CM | POA: Diagnosis not present

## 2015-05-22 DIAGNOSIS — Z7984 Long term (current) use of oral hypoglycemic drugs: Secondary | ICD-10-CM | POA: Diagnosis not present

## 2015-05-22 DIAGNOSIS — N183 Chronic kidney disease, stage 3 (moderate): Secondary | ICD-10-CM | POA: Diagnosis not present

## 2015-05-22 DIAGNOSIS — R69 Illness, unspecified: Secondary | ICD-10-CM | POA: Diagnosis not present

## 2015-05-25 DIAGNOSIS — E1122 Type 2 diabetes mellitus with diabetic chronic kidney disease: Secondary | ICD-10-CM | POA: Diagnosis not present

## 2015-05-25 DIAGNOSIS — Z7984 Long term (current) use of oral hypoglycemic drugs: Secondary | ICD-10-CM | POA: Diagnosis not present

## 2015-05-25 DIAGNOSIS — Z8673 Personal history of transient ischemic attack (TIA), and cerebral infarction without residual deficits: Secondary | ICD-10-CM | POA: Diagnosis not present

## 2015-05-25 DIAGNOSIS — R69 Illness, unspecified: Secondary | ICD-10-CM | POA: Diagnosis not present

## 2015-05-25 DIAGNOSIS — N183 Chronic kidney disease, stage 3 (moderate): Secondary | ICD-10-CM | POA: Diagnosis not present

## 2015-05-25 DIAGNOSIS — E039 Hypothyroidism, unspecified: Secondary | ICD-10-CM | POA: Diagnosis not present

## 2015-05-25 DIAGNOSIS — I129 Hypertensive chronic kidney disease with stage 1 through stage 4 chronic kidney disease, or unspecified chronic kidney disease: Secondary | ICD-10-CM | POA: Diagnosis not present

## 2015-05-28 DIAGNOSIS — N183 Chronic kidney disease, stage 3 (moderate): Secondary | ICD-10-CM | POA: Diagnosis not present

## 2015-05-28 DIAGNOSIS — E1122 Type 2 diabetes mellitus with diabetic chronic kidney disease: Secondary | ICD-10-CM | POA: Diagnosis not present

## 2015-05-28 DIAGNOSIS — E039 Hypothyroidism, unspecified: Secondary | ICD-10-CM | POA: Diagnosis not present

## 2015-05-28 DIAGNOSIS — R69 Illness, unspecified: Secondary | ICD-10-CM | POA: Diagnosis not present

## 2015-05-28 DIAGNOSIS — I129 Hypertensive chronic kidney disease with stage 1 through stage 4 chronic kidney disease, or unspecified chronic kidney disease: Secondary | ICD-10-CM | POA: Diagnosis not present

## 2015-05-28 DIAGNOSIS — Z7984 Long term (current) use of oral hypoglycemic drugs: Secondary | ICD-10-CM | POA: Diagnosis not present

## 2015-05-28 DIAGNOSIS — Z8673 Personal history of transient ischemic attack (TIA), and cerebral infarction without residual deficits: Secondary | ICD-10-CM | POA: Diagnosis not present

## 2015-06-01 DIAGNOSIS — Z7984 Long term (current) use of oral hypoglycemic drugs: Secondary | ICD-10-CM | POA: Diagnosis not present

## 2015-06-01 DIAGNOSIS — I129 Hypertensive chronic kidney disease with stage 1 through stage 4 chronic kidney disease, or unspecified chronic kidney disease: Secondary | ICD-10-CM | POA: Diagnosis not present

## 2015-06-01 DIAGNOSIS — E039 Hypothyroidism, unspecified: Secondary | ICD-10-CM | POA: Diagnosis not present

## 2015-06-01 DIAGNOSIS — E1122 Type 2 diabetes mellitus with diabetic chronic kidney disease: Secondary | ICD-10-CM | POA: Diagnosis not present

## 2015-06-01 DIAGNOSIS — Z8673 Personal history of transient ischemic attack (TIA), and cerebral infarction without residual deficits: Secondary | ICD-10-CM | POA: Diagnosis not present

## 2015-06-01 DIAGNOSIS — N183 Chronic kidney disease, stage 3 (moderate): Secondary | ICD-10-CM | POA: Diagnosis not present

## 2015-06-01 DIAGNOSIS — R69 Illness, unspecified: Secondary | ICD-10-CM | POA: Diagnosis not present

## 2015-06-06 DIAGNOSIS — Z8673 Personal history of transient ischemic attack (TIA), and cerebral infarction without residual deficits: Secondary | ICD-10-CM | POA: Diagnosis not present

## 2015-06-06 DIAGNOSIS — Z7984 Long term (current) use of oral hypoglycemic drugs: Secondary | ICD-10-CM | POA: Diagnosis not present

## 2015-06-06 DIAGNOSIS — E1122 Type 2 diabetes mellitus with diabetic chronic kidney disease: Secondary | ICD-10-CM | POA: Diagnosis not present

## 2015-06-06 DIAGNOSIS — I129 Hypertensive chronic kidney disease with stage 1 through stage 4 chronic kidney disease, or unspecified chronic kidney disease: Secondary | ICD-10-CM | POA: Diagnosis not present

## 2015-06-06 DIAGNOSIS — N183 Chronic kidney disease, stage 3 (moderate): Secondary | ICD-10-CM | POA: Diagnosis not present

## 2015-06-06 DIAGNOSIS — E039 Hypothyroidism, unspecified: Secondary | ICD-10-CM | POA: Diagnosis not present

## 2015-06-06 DIAGNOSIS — R69 Illness, unspecified: Secondary | ICD-10-CM | POA: Diagnosis not present

## 2015-06-18 DIAGNOSIS — K259 Gastric ulcer, unspecified as acute or chronic, without hemorrhage or perforation: Secondary | ICD-10-CM | POA: Diagnosis not present

## 2015-06-18 DIAGNOSIS — G459 Transient cerebral ischemic attack, unspecified: Secondary | ICD-10-CM | POA: Diagnosis not present

## 2015-06-18 DIAGNOSIS — E782 Mixed hyperlipidemia: Secondary | ICD-10-CM | POA: Diagnosis not present

## 2015-06-18 DIAGNOSIS — D509 Iron deficiency anemia, unspecified: Secondary | ICD-10-CM | POA: Diagnosis not present

## 2015-06-18 DIAGNOSIS — M1000 Idiopathic gout, unspecified site: Secondary | ICD-10-CM | POA: Diagnosis not present

## 2015-06-18 DIAGNOSIS — E1165 Type 2 diabetes mellitus with hyperglycemia: Secondary | ICD-10-CM | POA: Diagnosis not present

## 2015-06-18 DIAGNOSIS — I10 Essential (primary) hypertension: Secondary | ICD-10-CM | POA: Diagnosis not present

## 2015-06-18 DIAGNOSIS — E039 Hypothyroidism, unspecified: Secondary | ICD-10-CM | POA: Diagnosis not present

## 2015-06-18 DIAGNOSIS — I63532 Cerebral infarction due to unspecified occlusion or stenosis of left posterior cerebral artery: Secondary | ICD-10-CM | POA: Diagnosis not present

## 2015-06-28 DIAGNOSIS — D509 Iron deficiency anemia, unspecified: Secondary | ICD-10-CM | POA: Diagnosis not present

## 2015-06-28 DIAGNOSIS — E871 Hypo-osmolality and hyponatremia: Secondary | ICD-10-CM | POA: Diagnosis not present

## 2015-06-28 DIAGNOSIS — E782 Mixed hyperlipidemia: Secondary | ICD-10-CM | POA: Diagnosis not present

## 2015-06-28 DIAGNOSIS — E039 Hypothyroidism, unspecified: Secondary | ICD-10-CM | POA: Diagnosis not present

## 2015-06-28 DIAGNOSIS — E1165 Type 2 diabetes mellitus with hyperglycemia: Secondary | ICD-10-CM | POA: Diagnosis not present

## 2015-06-28 DIAGNOSIS — E876 Hypokalemia: Secondary | ICD-10-CM | POA: Diagnosis not present

## 2015-06-28 DIAGNOSIS — I10 Essential (primary) hypertension: Secondary | ICD-10-CM | POA: Diagnosis not present

## 2015-07-24 DIAGNOSIS — Z0001 Encounter for general adult medical examination with abnormal findings: Secondary | ICD-10-CM | POA: Diagnosis not present

## 2015-07-24 DIAGNOSIS — E1165 Type 2 diabetes mellitus with hyperglycemia: Secondary | ICD-10-CM | POA: Diagnosis not present

## 2015-07-24 DIAGNOSIS — E782 Mixed hyperlipidemia: Secondary | ICD-10-CM | POA: Diagnosis not present

## 2015-07-24 DIAGNOSIS — E039 Hypothyroidism, unspecified: Secondary | ICD-10-CM | POA: Diagnosis not present

## 2015-07-24 DIAGNOSIS — G4733 Obstructive sleep apnea (adult) (pediatric): Secondary | ICD-10-CM | POA: Diagnosis not present

## 2015-07-24 DIAGNOSIS — E871 Hypo-osmolality and hyponatremia: Secondary | ICD-10-CM | POA: Diagnosis not present

## 2015-07-24 DIAGNOSIS — I63532 Cerebral infarction due to unspecified occlusion or stenosis of left posterior cerebral artery: Secondary | ICD-10-CM | POA: Diagnosis not present

## 2015-07-24 DIAGNOSIS — I10 Essential (primary) hypertension: Secondary | ICD-10-CM | POA: Diagnosis not present

## 2015-07-24 DIAGNOSIS — M1000 Idiopathic gout, unspecified site: Secondary | ICD-10-CM | POA: Diagnosis not present

## 2015-07-24 DIAGNOSIS — D509 Iron deficiency anemia, unspecified: Secondary | ICD-10-CM | POA: Diagnosis not present

## 2015-09-12 IMAGING — US US CAROTID DUPLEX BILAT
1 series · 13 of 24 positions shown · non-contrast
Comparison: Brain MRI 02/08/2015

CLINICAL DATA: 83-year-old male with history of prior ischemic
stroke

EXAM:
BILATERAL CAROTID DUPLEX ULTRASOUND
TECHNIQUE: Gray scale imaging, color Doppler and duplex ultrasound were
performed of bilateral carotid and vertebral arteries in the neck.

[Series 1: us carotid duplex bilat · 0.07mm/px · 13 of 68 slices shown]
[im 1/68]
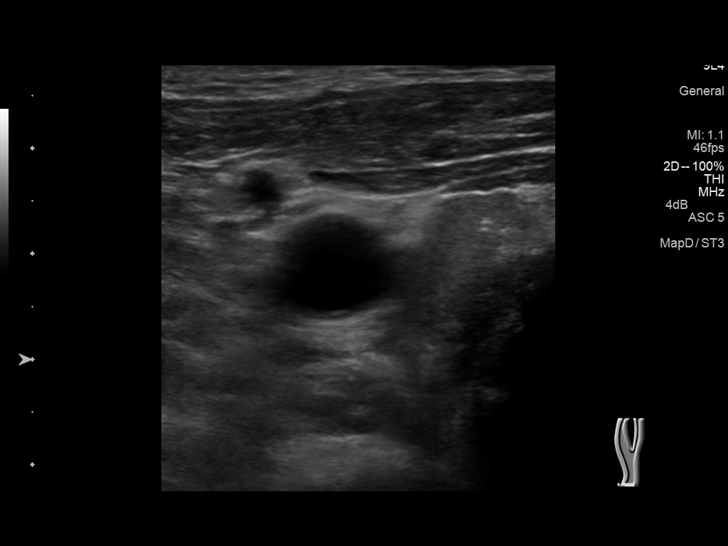
[im 6/68]
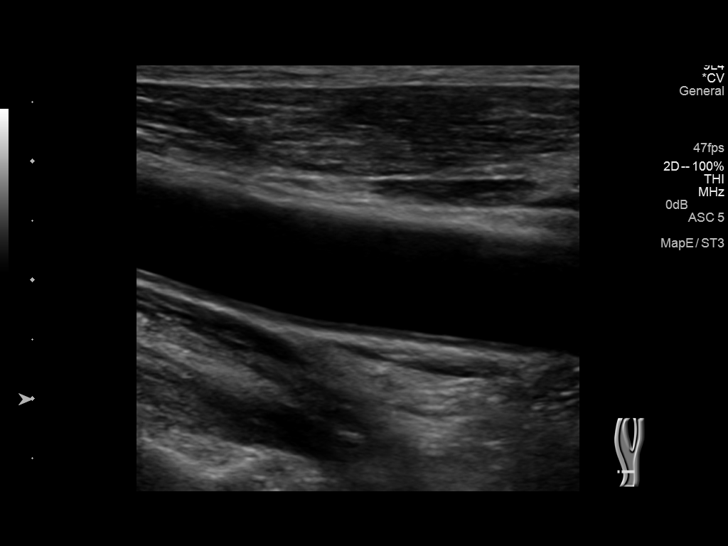
[im 12/68]
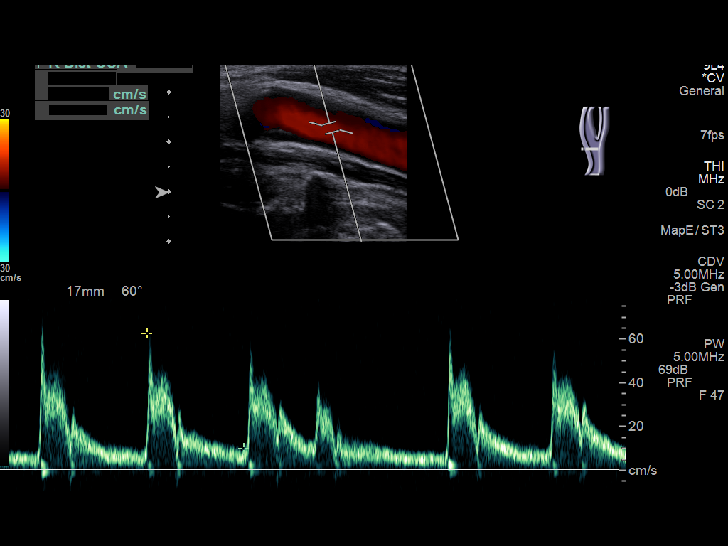
[im 18/68]
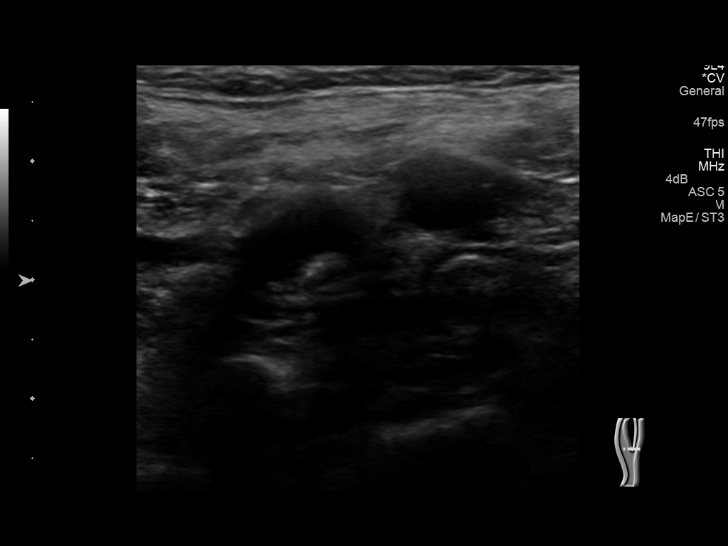
[im 24/68]
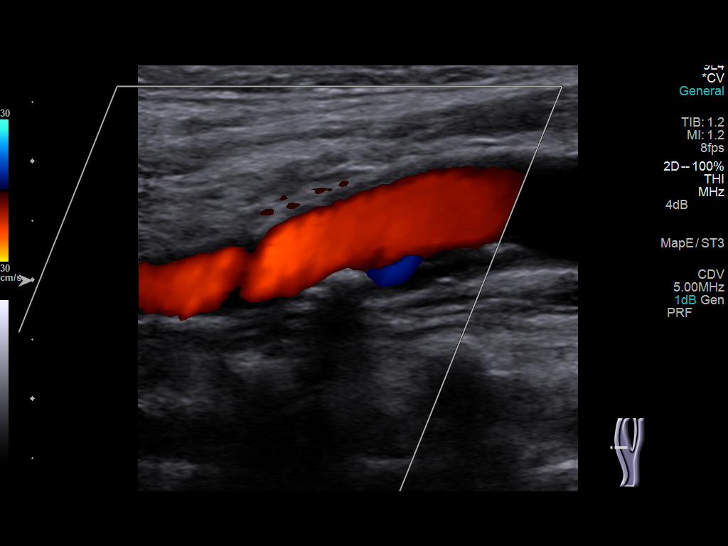
[im 30/68]
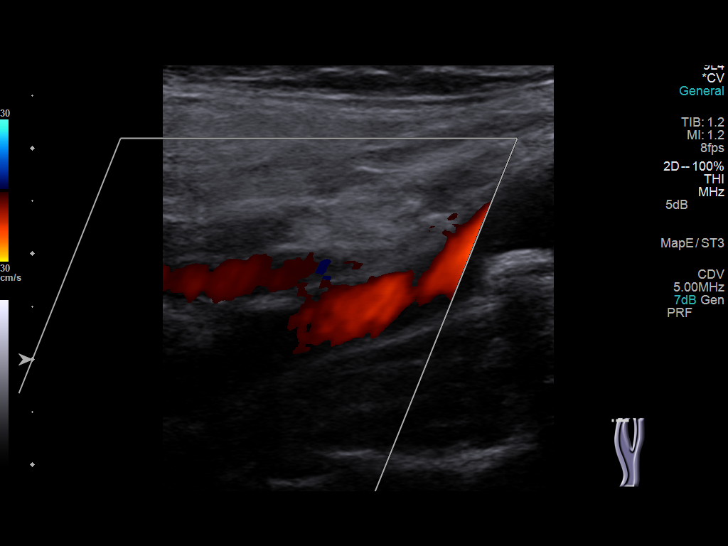
[im 35/68]
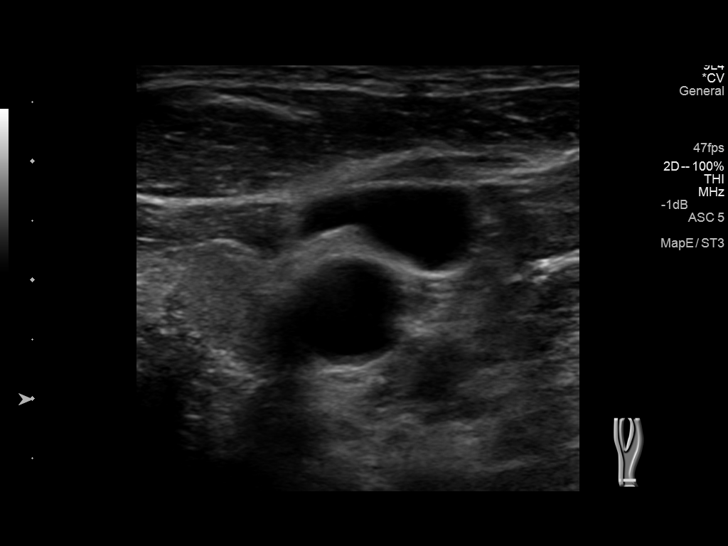
[im 38/68]
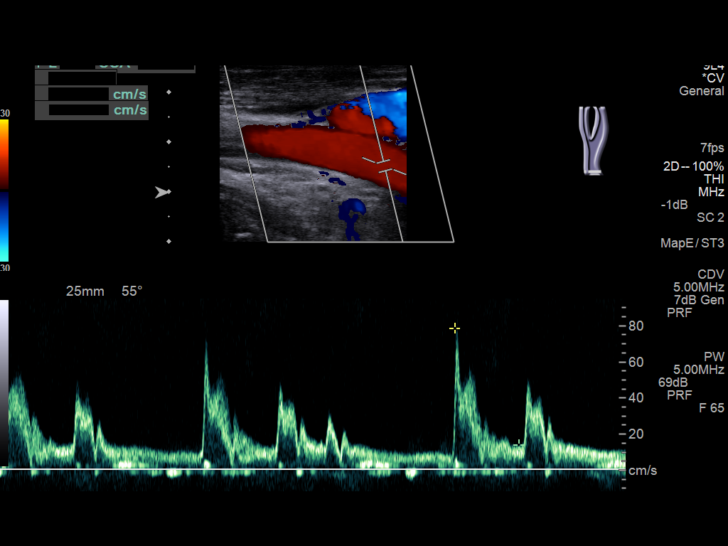
[im 44/68]
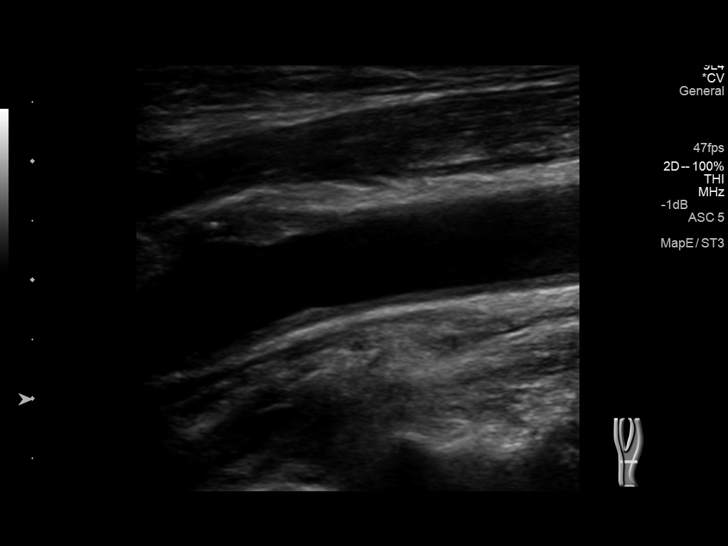
[im 50/68]
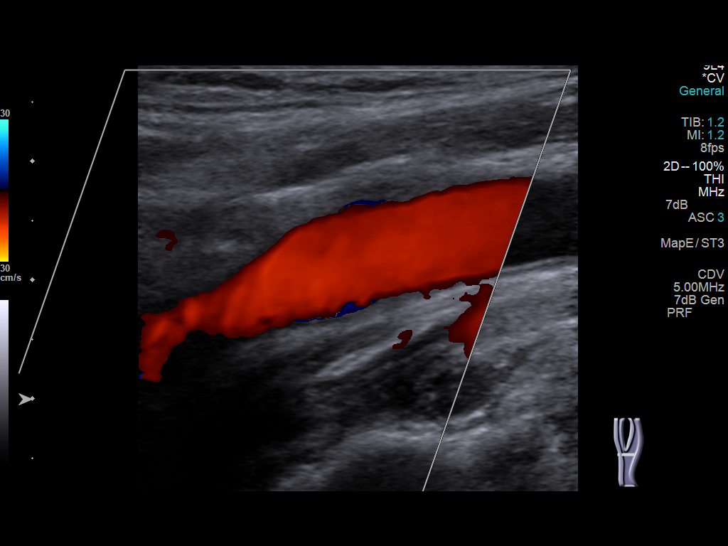
[im 56/68]
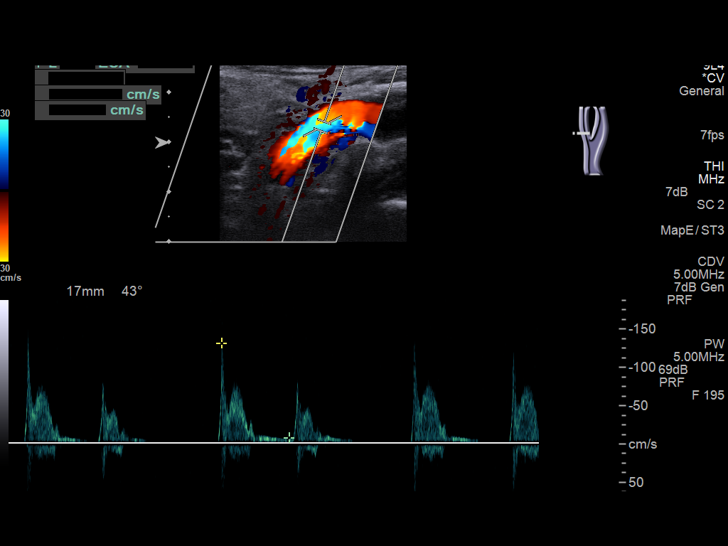
[im 62/68]
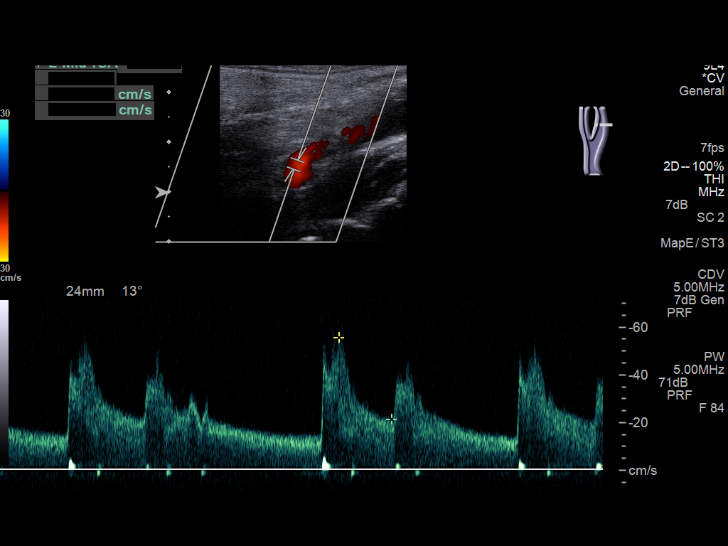
[im 68/68]
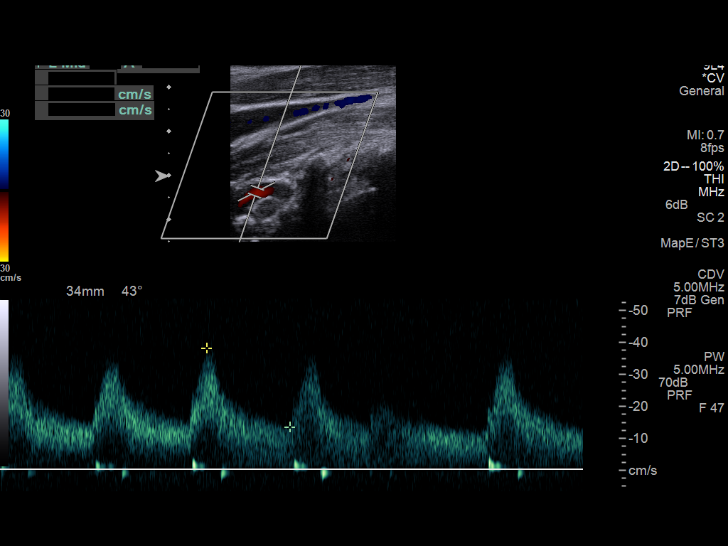

[13 of 24 positions shown; findings below may reference images not displayed]

FINDINGS: Criteria: Quantification of carotid stenosis is based on velocity
parameters that correlate the residual internal carotid diameter
with NASCET-based stenosis levels, using the diameter of the distal
internal carotid lumen as the denominator for stenosis measurement.

The following velocity measurements were obtained:

RIGHT

ICA:  67/18 cm/sec

CCA:  59/10 cm/sec

SYSTOLIC ICA/CCA RATIO:

DIASTOLIC ICA/CCA RATIO:

ECA:  74 cm/sec

LEFT

ICA:  58/19 cm/sec

CCA:  79/17 cm/sec

SYSTOLIC ICA/CCA RATIO:

DIASTOLIC ICA/CCA RATIO:

ECA:  132 cm/sec

RIGHT CAROTID ARTERY: Smooth near circumferential heterogeneous
plaque in the internal carotid bulb and proximal internal carotid
artery. By peak systolic velocity criteria the stenosis remains less
than 50%. The pulse is irregular. Pulsus bisferiens also noted on
the spectral waveform.

RIGHT VERTEBRAL ARTERY:  Patent with antegrade flow.

LEFT CAROTID ARTERY: Mild intimal medial thickening in the distal
common carotid artery. Trace atherosclerotic plaque in the carotid
bulb. No significant plaque in the internal carotid artery or
evidence of stenosis. The pulse is again noted to be irregular.
Pulsus bisferiens also noted on the spectral waveform.

LEFT VERTEBRAL ARTERY:  Patent with normal antegrade flow.
IMPRESSION: 1. Mild (1-49%) stenosis proximal right internal carotid artery
secondary to smooth heterogeneous atherosclerotic plaque.
2. Mild intimal medial thickening and trace atherosclerotic plaque
on the left without evidence of internal carotid stenosis.
3. Vertebral arteries are patent with normal antegrade flow.
4. Pulsus bisferiens noted. This is often associated with aortic
stenosis or aortic stenosis with regurgitation.
5. Irregularity of the cardiac rhythm. Does the patient have a
history of atrial fibrillation?

## 2016-01-21 DIAGNOSIS — D509 Iron deficiency anemia, unspecified: Secondary | ICD-10-CM | POA: Diagnosis not present

## 2016-01-21 DIAGNOSIS — E876 Hypokalemia: Secondary | ICD-10-CM | POA: Diagnosis not present

## 2016-01-21 DIAGNOSIS — E1165 Type 2 diabetes mellitus with hyperglycemia: Secondary | ICD-10-CM | POA: Diagnosis not present

## 2016-01-21 DIAGNOSIS — I10 Essential (primary) hypertension: Secondary | ICD-10-CM | POA: Diagnosis not present

## 2016-01-21 DIAGNOSIS — E782 Mixed hyperlipidemia: Secondary | ICD-10-CM | POA: Diagnosis not present

## 2016-01-21 DIAGNOSIS — E039 Hypothyroidism, unspecified: Secondary | ICD-10-CM | POA: Diagnosis not present

## 2016-01-23 DIAGNOSIS — E782 Mixed hyperlipidemia: Secondary | ICD-10-CM | POA: Diagnosis not present

## 2016-01-23 DIAGNOSIS — E871 Hypo-osmolality and hyponatremia: Secondary | ICD-10-CM | POA: Diagnosis not present

## 2016-01-23 DIAGNOSIS — Z6835 Body mass index (BMI) 35.0-35.9, adult: Secondary | ICD-10-CM | POA: Diagnosis not present

## 2016-01-23 DIAGNOSIS — D509 Iron deficiency anemia, unspecified: Secondary | ICD-10-CM | POA: Diagnosis not present

## 2016-01-23 DIAGNOSIS — E039 Hypothyroidism, unspecified: Secondary | ICD-10-CM | POA: Diagnosis not present

## 2016-01-23 DIAGNOSIS — E876 Hypokalemia: Secondary | ICD-10-CM | POA: Diagnosis not present

## 2016-01-23 DIAGNOSIS — E1165 Type 2 diabetes mellitus with hyperglycemia: Secondary | ICD-10-CM | POA: Diagnosis not present

## 2016-01-23 DIAGNOSIS — M1000 Idiopathic gout, unspecified site: Secondary | ICD-10-CM | POA: Diagnosis not present

## 2016-02-01 ENCOUNTER — Encounter: Payer: Self-pay | Admitting: Cardiology

## 2016-02-01 DIAGNOSIS — R931 Abnormal findings on diagnostic imaging of heart and coronary circulation: Secondary | ICD-10-CM | POA: Diagnosis not present

## 2016-02-01 DIAGNOSIS — I517 Cardiomegaly: Secondary | ICD-10-CM | POA: Diagnosis not present

## 2016-02-01 DIAGNOSIS — R600 Localized edema: Secondary | ICD-10-CM | POA: Diagnosis not present

## 2016-04-21 DIAGNOSIS — Z23 Encounter for immunization: Secondary | ICD-10-CM | POA: Diagnosis not present

## 2016-04-29 DIAGNOSIS — L84 Corns and callosities: Secondary | ICD-10-CM | POA: Diagnosis not present

## 2016-04-29 DIAGNOSIS — M205X2 Other deformities of toe(s) (acquired), left foot: Secondary | ICD-10-CM | POA: Diagnosis not present

## 2016-04-29 DIAGNOSIS — D509 Iron deficiency anemia, unspecified: Secondary | ICD-10-CM | POA: Diagnosis not present

## 2016-04-29 DIAGNOSIS — R600 Localized edema: Secondary | ICD-10-CM | POA: Diagnosis not present

## 2016-04-29 DIAGNOSIS — E1165 Type 2 diabetes mellitus with hyperglycemia: Secondary | ICD-10-CM | POA: Diagnosis not present

## 2016-04-29 DIAGNOSIS — M205X1 Other deformities of toe(s) (acquired), right foot: Secondary | ICD-10-CM | POA: Diagnosis not present

## 2016-04-29 DIAGNOSIS — I10 Essential (primary) hypertension: Secondary | ICD-10-CM | POA: Diagnosis not present

## 2016-04-29 DIAGNOSIS — M1000 Idiopathic gout, unspecified site: Secondary | ICD-10-CM | POA: Diagnosis not present

## 2016-04-30 DIAGNOSIS — E871 Hypo-osmolality and hyponatremia: Secondary | ICD-10-CM | POA: Diagnosis not present

## 2016-04-30 DIAGNOSIS — E039 Hypothyroidism, unspecified: Secondary | ICD-10-CM | POA: Diagnosis not present

## 2016-04-30 DIAGNOSIS — E876 Hypokalemia: Secondary | ICD-10-CM | POA: Diagnosis not present

## 2016-04-30 DIAGNOSIS — E782 Mixed hyperlipidemia: Secondary | ICD-10-CM | POA: Diagnosis not present

## 2016-04-30 DIAGNOSIS — E1165 Type 2 diabetes mellitus with hyperglycemia: Secondary | ICD-10-CM | POA: Diagnosis not present

## 2016-04-30 DIAGNOSIS — M1000 Idiopathic gout, unspecified site: Secondary | ICD-10-CM | POA: Diagnosis not present

## 2016-05-05 DIAGNOSIS — E782 Mixed hyperlipidemia: Secondary | ICD-10-CM | POA: Diagnosis not present

## 2016-05-05 DIAGNOSIS — M1712 Unilateral primary osteoarthritis, left knee: Secondary | ICD-10-CM | POA: Diagnosis not present

## 2016-05-05 DIAGNOSIS — I10 Essential (primary) hypertension: Secondary | ICD-10-CM | POA: Diagnosis not present

## 2016-05-05 DIAGNOSIS — D509 Iron deficiency anemia, unspecified: Secondary | ICD-10-CM | POA: Diagnosis not present

## 2016-05-05 DIAGNOSIS — Z6836 Body mass index (BMI) 36.0-36.9, adult: Secondary | ICD-10-CM | POA: Diagnosis not present

## 2016-05-05 DIAGNOSIS — E1165 Type 2 diabetes mellitus with hyperglycemia: Secondary | ICD-10-CM | POA: Diagnosis not present

## 2016-05-05 DIAGNOSIS — I63532 Cerebral infarction due to unspecified occlusion or stenosis of left posterior cerebral artery: Secondary | ICD-10-CM | POA: Diagnosis not present

## 2016-05-05 DIAGNOSIS — M1000 Idiopathic gout, unspecified site: Secondary | ICD-10-CM | POA: Diagnosis not present

## 2016-05-14 DIAGNOSIS — L84 Corns and callosities: Secondary | ICD-10-CM | POA: Diagnosis not present

## 2016-05-14 DIAGNOSIS — E1165 Type 2 diabetes mellitus with hyperglycemia: Secondary | ICD-10-CM | POA: Diagnosis not present

## 2016-05-14 DIAGNOSIS — M2042 Other hammer toe(s) (acquired), left foot: Secondary | ICD-10-CM | POA: Diagnosis not present

## 2016-05-14 DIAGNOSIS — M2041 Other hammer toe(s) (acquired), right foot: Secondary | ICD-10-CM | POA: Diagnosis not present

## 2016-06-28 IMAGING — DX DG CHEST 2V
2 series · 2 of 2 positions shown · non-contrast
Comparison: Chest x-ray 01/02/2014.

CLINICAL DATA: 83-year-old male with dizziness and altered mental
status earlier today while driving. Confusion.

EXAM:
CHEST  2 VIEW

[chest pa]
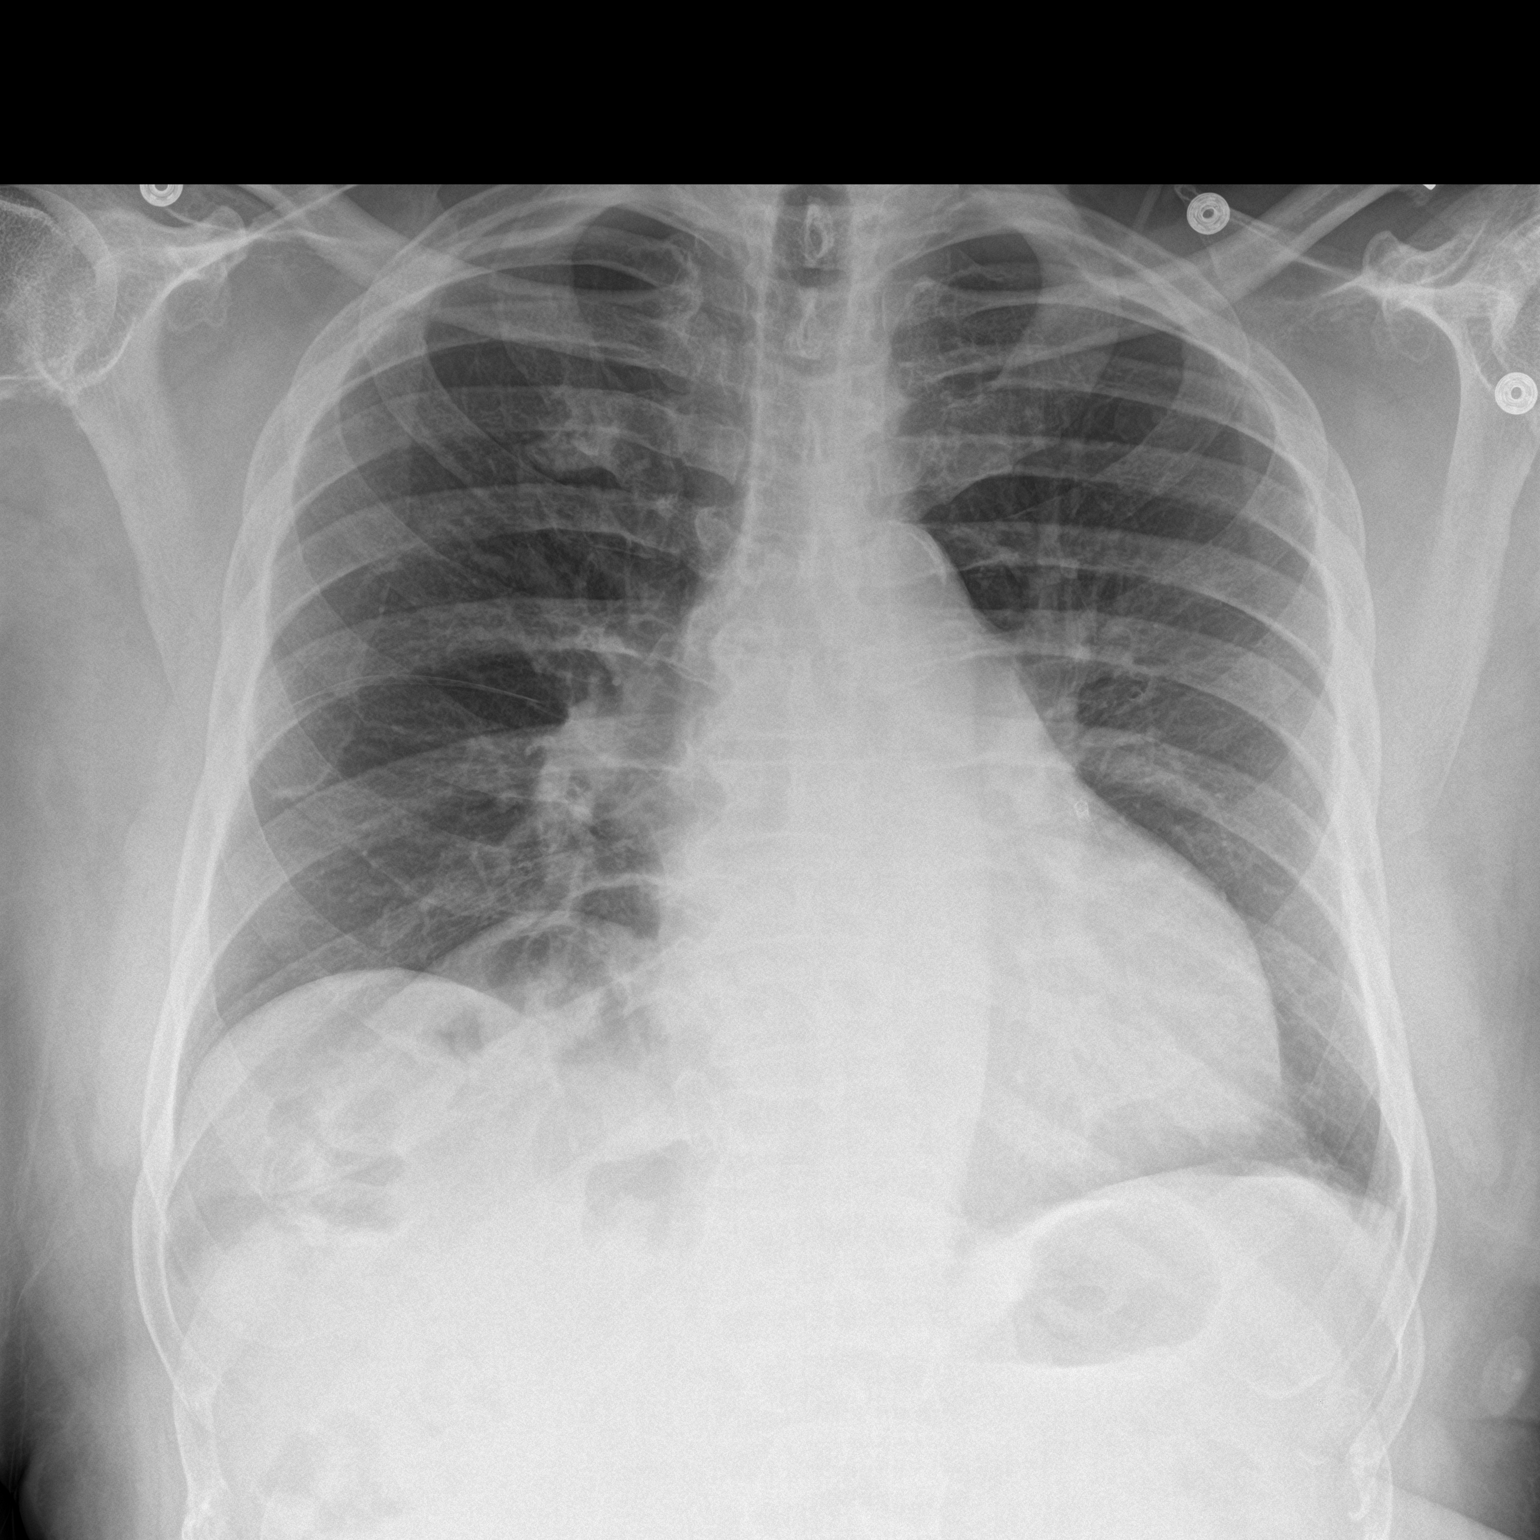

[chest lat]
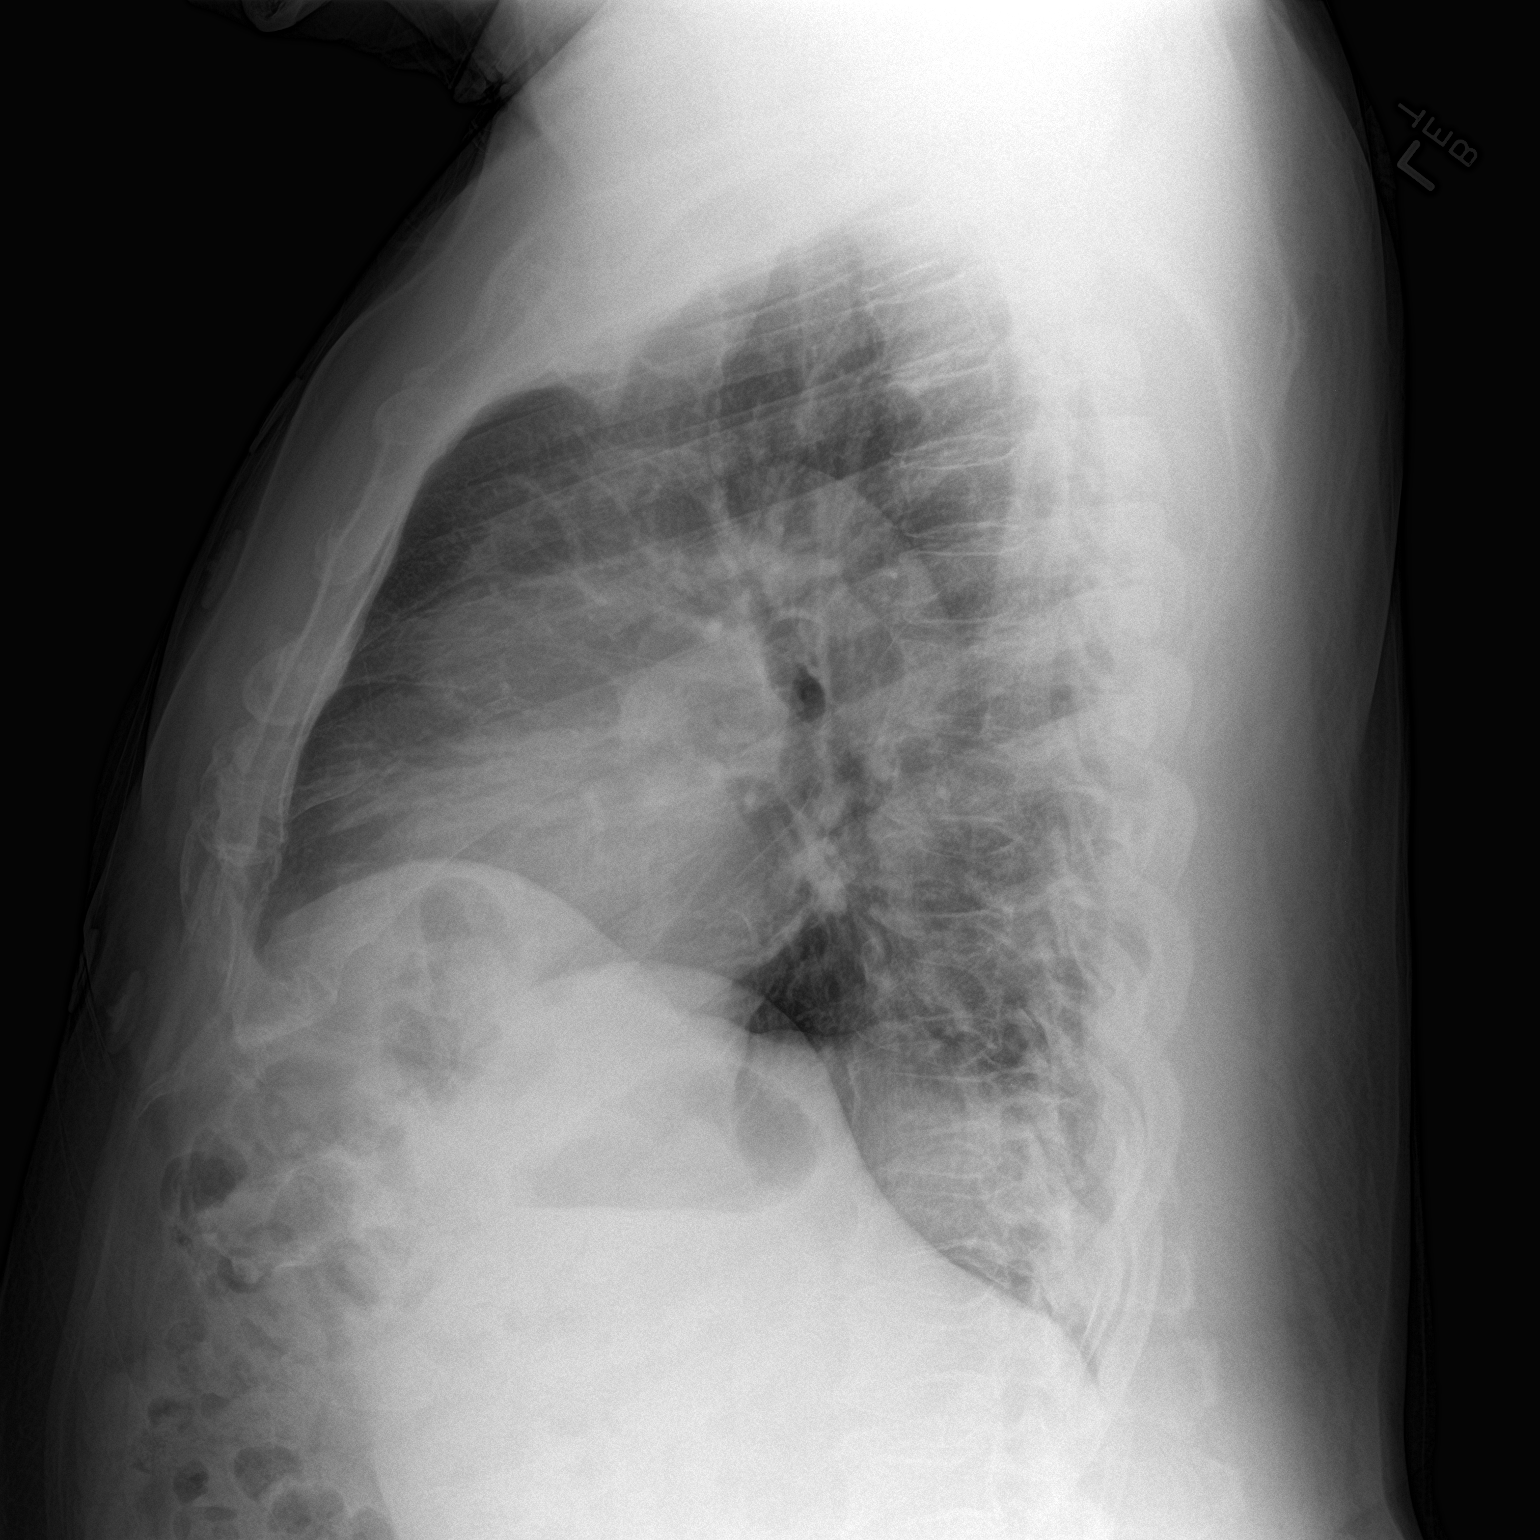

[2 of 2 positions shown; findings below may reference images not displayed]

FINDINGS: Eventration of the right hemidiaphragm. Lung volumes are normal. No
consolidative airspace disease. No pleural effusions. No
pneumothorax. No pulmonary nodule or mass noted. Pulmonary
vasculature and the cardiomediastinal silhouette are within normal
limits. Atherosclerosis in the thoracic aorta.
IMPRESSION: 1. No radiographic evidence of acute cardiopulmonary disease.
2. Atherosclerosis.

## 2016-06-28 IMAGING — MR MR HEAD W/O CM
8 of 10 series · 36 of 48 positions shown · non-contrast
Comparison: CT head 02/27/2014

CLINICAL DATA: Dizziness and altered mental status.

EXAM:
MRI HEAD WITHOUT CONTRAST
TECHNIQUE: Multiplanar, multiecho pulse sequences of the brain and surrounding
structures were obtained without intravenous contrast.

[Series 2: [id number] · sagittal · 5.0mm · 0.48mm/px · 2 of 20 slices shown]
[im 1/20]
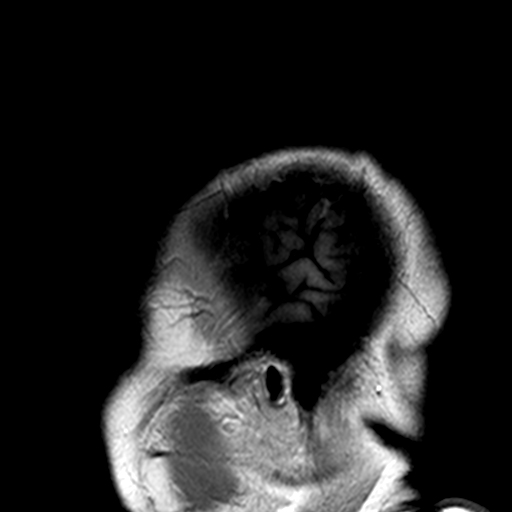
[im 20/20]
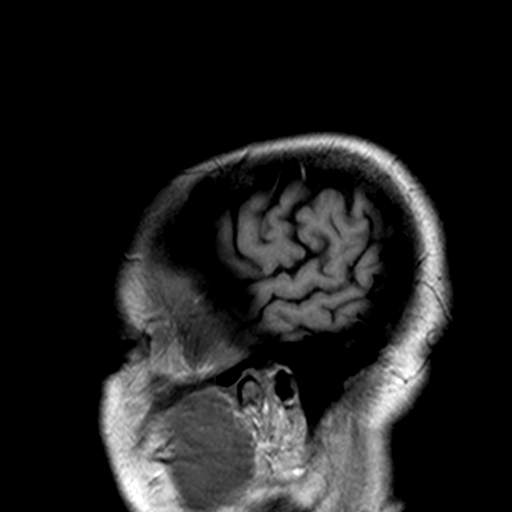

[Series 5: T2 · axial · 5.0mm · 0.75mm/px · z∈[-8,+142]mm · 3 of 24 slices shown (1 of 2)]
[im 1/24]
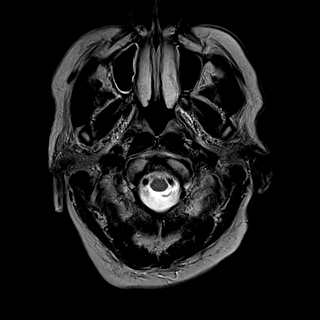
[im 12/24]
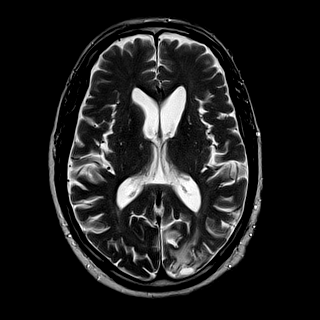
[im 24/24]
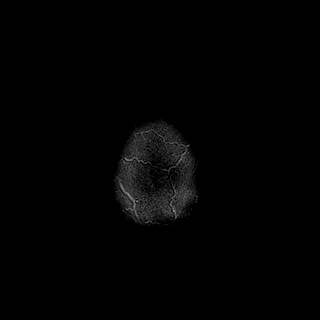

[Series 6: FLAIR · axial · 5.0mm · 0.94mm/px · z∈[-8,+142]mm · 3 of 24 slices shown]
[im 1/24]
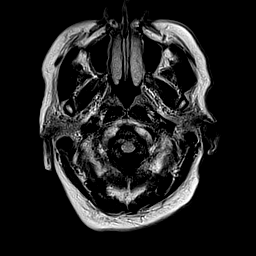
[im 12/24]
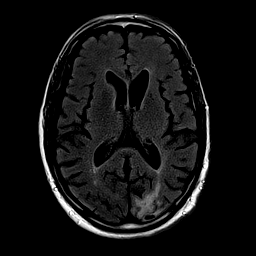
[im 24/24]
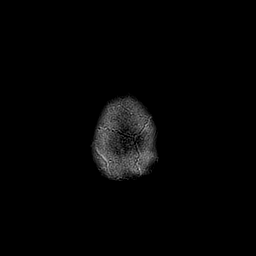

[Series 7: T1 · axial · 2.0mm · 0.47mm/px · z∈[-26,+142]mm · 9 of 85 slices shown]
[im 1/85]
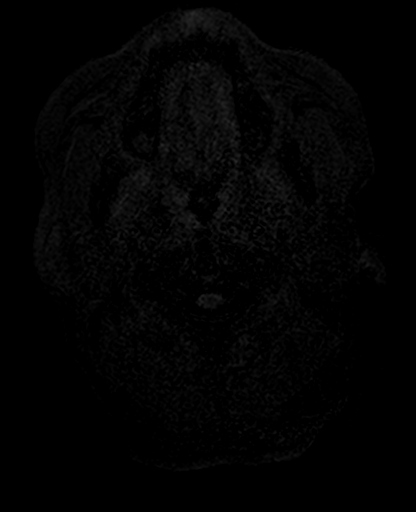
[im 11/85]
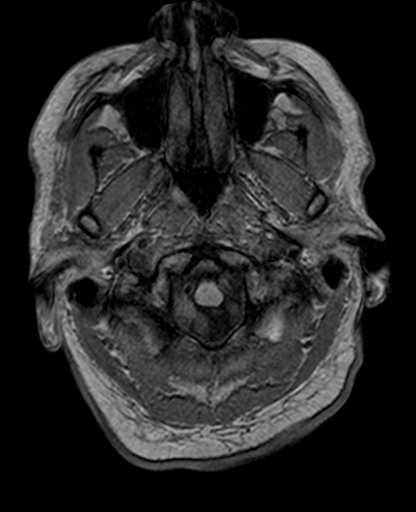
[im 22/85]
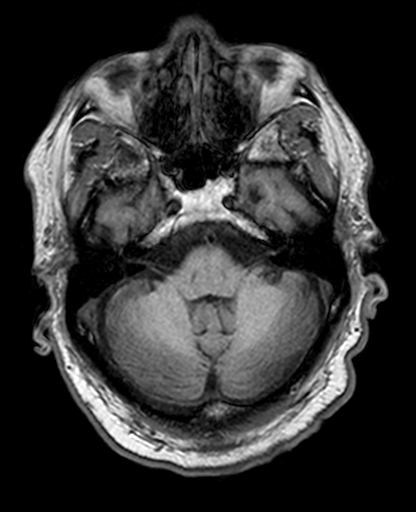
[im 32/85]
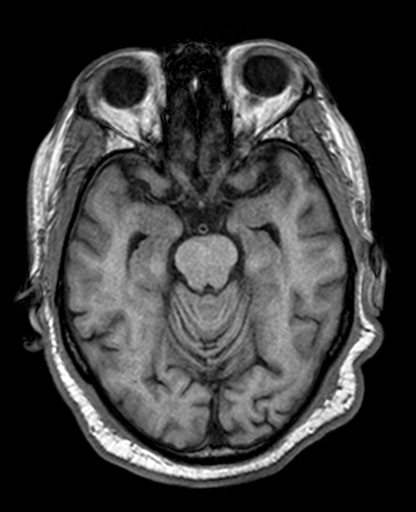
[im 43/85]
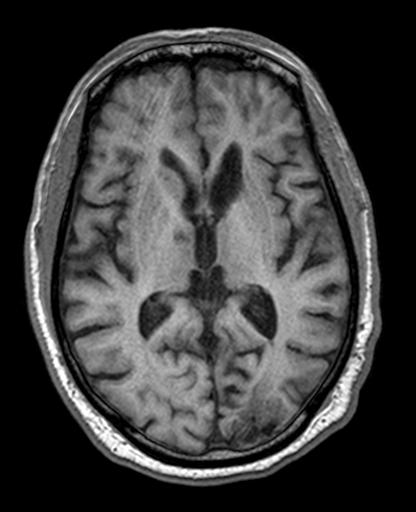
[im 53/85]
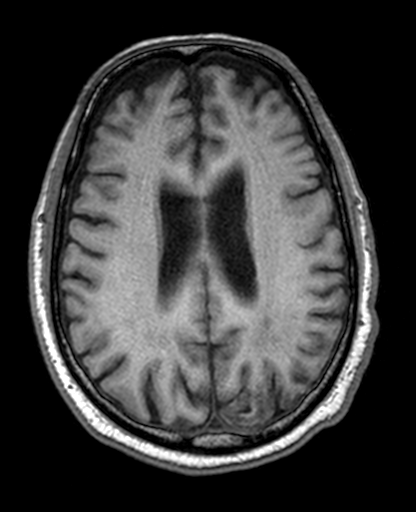
[im 64/85]
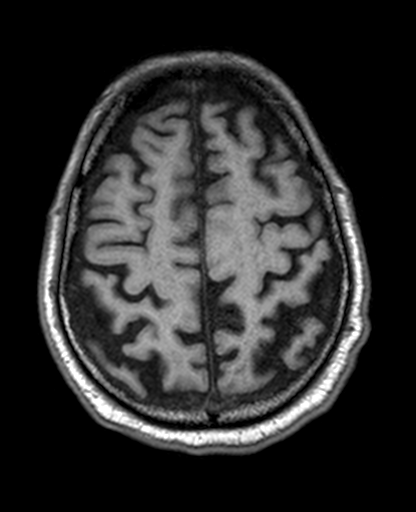
[im 74/85]
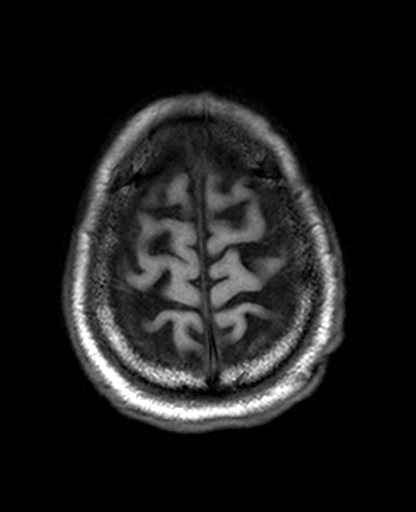
[im 85/85]
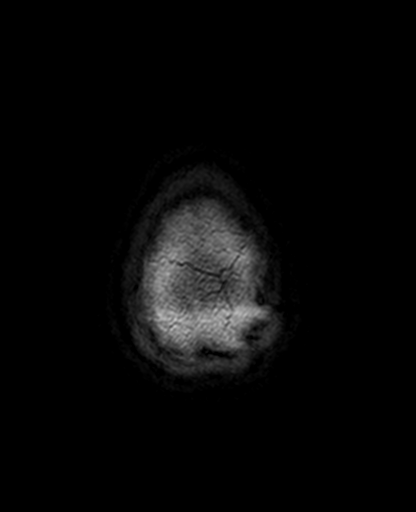

[Series 8: trauma axial · axial · 5.0mm · 0.45mm/px · z∈[-10,+146]mm · 3 of 25 slices shown]
[im 1/25]
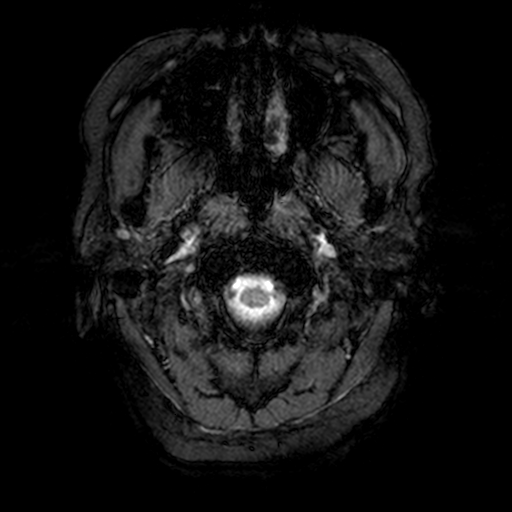
[im 13/25]
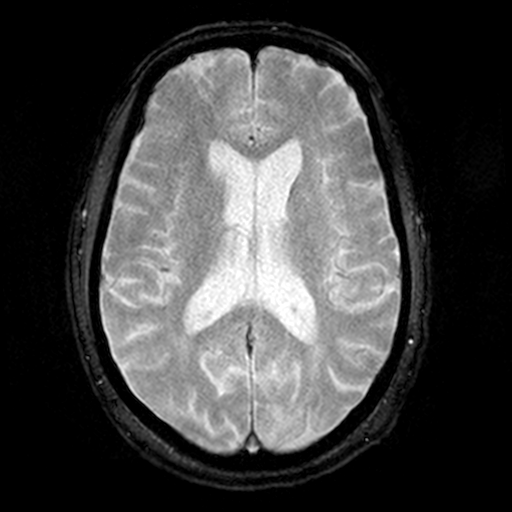
[im 25/25]
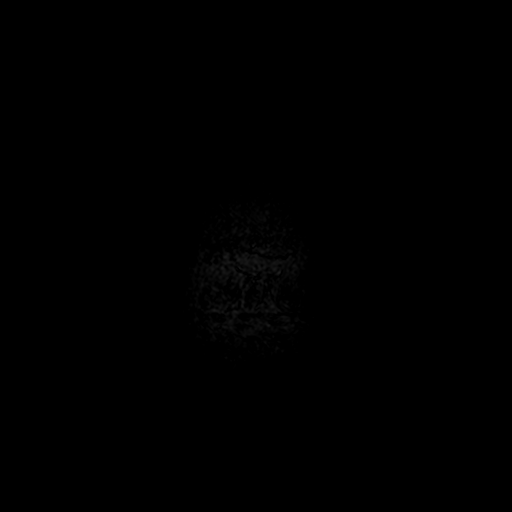

[Series 9: T2 · coronal · 5.0mm · 0.68mm/px · 3 of 28 slices shown (2 of 2)]
[im 1/28]
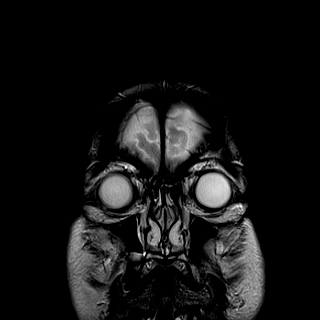
[im 14/28]
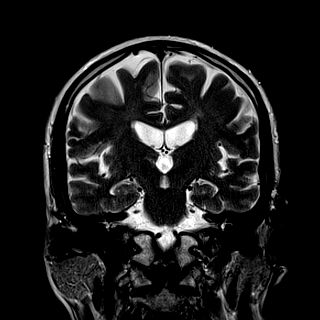
[im 28/28]
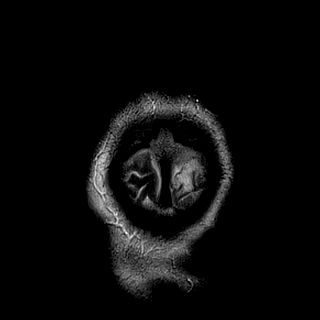

[Series 100: <mpr thick range> · axial · 3.0mm · 0.88mm/px · z∈[-10,+140]mm · 5 of 51 slices shown]
[im 1/51]
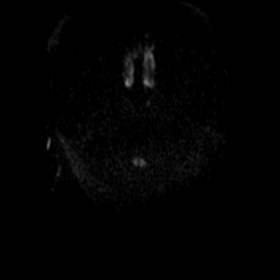
[im 13/51]
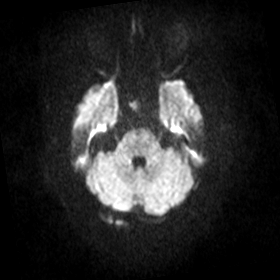
[im 26/51]
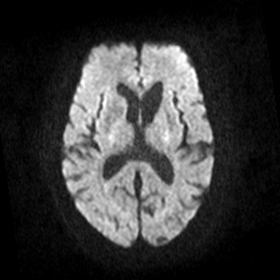
[im 38/51]
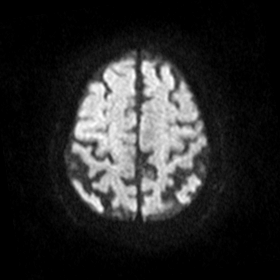
[im 51/51]
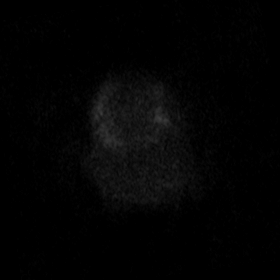

[Series 101: <mpr thick range(1)> · coronal · 3.0mm · 0.72mm/px · 8 of 71 slices shown]
[im 1/71]
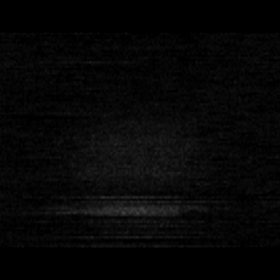
[im 11/71]
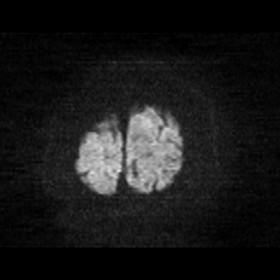
[im 21/71]
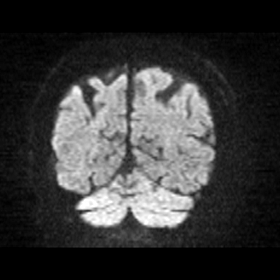
[im 31/71]
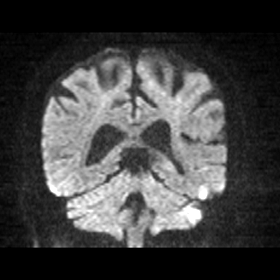
[im 41/71]
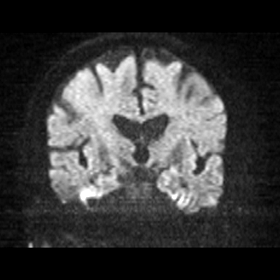
[im 51/71]
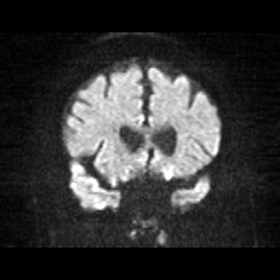
[im 61/71]
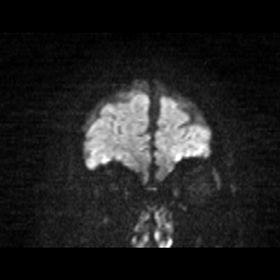
[im 71/71]
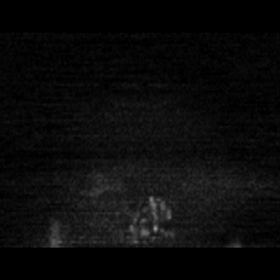

[36 of 48 positions shown; findings below may reference images not displayed]

FINDINGS: Negative for acute infarct.

Chronic left occipital infarct, not present on the prior CT. Chronic
infarct right medial thalamus. Chronic microvascular ischemia in the
cerebral white matter. Brainstem and cerebellum intact

Pituitary normal in size.  Craniocervical junction normal.

Mild mucosal edema in the paranasal sinuses. Bilateral lens
extraction.

Negative for intracranial hemorrhage. Negative for mass or edema. No
shift of the midline structures.
IMPRESSION: Chronic ischemia including the left occipital lobe and right
thalamus and white matter.

Negative for acute infarct or mass.

## 2016-11-05 ENCOUNTER — Encounter: Payer: Self-pay | Admitting: Cardiology

## 2016-11-05 DIAGNOSIS — E782 Mixed hyperlipidemia: Secondary | ICD-10-CM | POA: Diagnosis not present

## 2016-11-05 DIAGNOSIS — N400 Enlarged prostate without lower urinary tract symptoms: Secondary | ICD-10-CM | POA: Diagnosis not present

## 2016-11-05 DIAGNOSIS — D509 Iron deficiency anemia, unspecified: Secondary | ICD-10-CM | POA: Diagnosis not present

## 2016-11-05 DIAGNOSIS — E1165 Type 2 diabetes mellitus with hyperglycemia: Secondary | ICD-10-CM | POA: Diagnosis not present

## 2016-11-05 DIAGNOSIS — I10 Essential (primary) hypertension: Secondary | ICD-10-CM | POA: Diagnosis not present

## 2016-11-05 DIAGNOSIS — Z8673 Personal history of transient ischemic attack (TIA), and cerebral infarction without residual deficits: Secondary | ICD-10-CM | POA: Diagnosis not present

## 2016-11-05 DIAGNOSIS — E039 Hypothyroidism, unspecified: Secondary | ICD-10-CM | POA: Diagnosis not present

## 2016-11-05 DIAGNOSIS — Z6835 Body mass index (BMI) 35.0-35.9, adult: Secondary | ICD-10-CM | POA: Diagnosis not present

## 2016-11-05 DIAGNOSIS — Z9114 Patient's other noncompliance with medication regimen: Secondary | ICD-10-CM | POA: Diagnosis not present

## 2016-11-05 DIAGNOSIS — E7800 Pure hypercholesterolemia, unspecified: Secondary | ICD-10-CM | POA: Diagnosis not present

## 2016-11-05 DIAGNOSIS — G4733 Obstructive sleep apnea (adult) (pediatric): Secondary | ICD-10-CM | POA: Diagnosis not present

## 2016-11-05 DIAGNOSIS — M109 Gout, unspecified: Secondary | ICD-10-CM | POA: Diagnosis not present

## 2016-11-05 DIAGNOSIS — K259 Gastric ulcer, unspecified as acute or chronic, without hemorrhage or perforation: Secondary | ICD-10-CM | POA: Diagnosis not present

## 2016-11-05 DIAGNOSIS — K219 Gastro-esophageal reflux disease without esophagitis: Secondary | ICD-10-CM | POA: Diagnosis not present

## 2016-11-05 DIAGNOSIS — I63532 Cerebral infarction due to unspecified occlusion or stenosis of left posterior cerebral artery: Secondary | ICD-10-CM | POA: Diagnosis not present

## 2016-11-05 DIAGNOSIS — R7989 Other specified abnormal findings of blood chemistry: Secondary | ICD-10-CM | POA: Diagnosis not present

## 2016-11-05 DIAGNOSIS — Z9111 Patient's noncompliance with dietary regimen: Secondary | ICD-10-CM | POA: Diagnosis not present

## 2016-11-05 DIAGNOSIS — R739 Hyperglycemia, unspecified: Secondary | ICD-10-CM | POA: Diagnosis not present

## 2016-11-06 DIAGNOSIS — I5032 Chronic diastolic (congestive) heart failure: Secondary | ICD-10-CM | POA: Diagnosis not present

## 2016-11-06 DIAGNOSIS — E782 Mixed hyperlipidemia: Secondary | ICD-10-CM | POA: Diagnosis not present

## 2016-11-06 DIAGNOSIS — I10 Essential (primary) hypertension: Secondary | ICD-10-CM | POA: Diagnosis not present

## 2016-11-06 DIAGNOSIS — E876 Hypokalemia: Secondary | ICD-10-CM | POA: Diagnosis not present

## 2016-11-06 DIAGNOSIS — E1165 Type 2 diabetes mellitus with hyperglycemia: Secondary | ICD-10-CM | POA: Diagnosis not present

## 2016-11-06 DIAGNOSIS — E039 Hypothyroidism, unspecified: Secondary | ICD-10-CM | POA: Diagnosis not present

## 2016-11-10 DIAGNOSIS — E871 Hypo-osmolality and hyponatremia: Secondary | ICD-10-CM | POA: Diagnosis not present

## 2016-11-10 DIAGNOSIS — I10 Essential (primary) hypertension: Secondary | ICD-10-CM | POA: Diagnosis not present

## 2016-11-10 DIAGNOSIS — D509 Iron deficiency anemia, unspecified: Secondary | ICD-10-CM | POA: Diagnosis not present

## 2016-11-10 DIAGNOSIS — E1165 Type 2 diabetes mellitus with hyperglycemia: Secondary | ICD-10-CM | POA: Diagnosis not present

## 2016-11-10 DIAGNOSIS — G4733 Obstructive sleep apnea (adult) (pediatric): Secondary | ICD-10-CM | POA: Diagnosis not present

## 2016-11-10 DIAGNOSIS — I110 Hypertensive heart disease with heart failure: Secondary | ICD-10-CM | POA: Diagnosis not present

## 2016-11-10 DIAGNOSIS — E782 Mixed hyperlipidemia: Secondary | ICD-10-CM | POA: Diagnosis not present

## 2016-11-10 DIAGNOSIS — Z6834 Body mass index (BMI) 34.0-34.9, adult: Secondary | ICD-10-CM | POA: Diagnosis not present

## 2016-11-20 ENCOUNTER — Encounter: Payer: Self-pay | Admitting: Cardiology

## 2016-11-20 DIAGNOSIS — R931 Abnormal findings on diagnostic imaging of heart and coronary circulation: Secondary | ICD-10-CM | POA: Diagnosis not present

## 2016-11-20 DIAGNOSIS — R0602 Shortness of breath: Secondary | ICD-10-CM | POA: Diagnosis not present

## 2016-11-20 DIAGNOSIS — I2510 Atherosclerotic heart disease of native coronary artery without angina pectoris: Secondary | ICD-10-CM | POA: Diagnosis not present

## 2016-11-20 DIAGNOSIS — I252 Old myocardial infarction: Secondary | ICD-10-CM | POA: Diagnosis not present

## 2016-11-20 DIAGNOSIS — E119 Type 2 diabetes mellitus without complications: Secondary | ICD-10-CM | POA: Diagnosis not present

## 2016-11-21 DIAGNOSIS — R351 Nocturia: Secondary | ICD-10-CM | POA: Diagnosis not present

## 2016-11-21 DIAGNOSIS — R600 Localized edema: Secondary | ICD-10-CM | POA: Diagnosis not present

## 2016-11-21 DIAGNOSIS — I10 Essential (primary) hypertension: Secondary | ICD-10-CM | POA: Diagnosis not present

## 2016-11-21 DIAGNOSIS — R0601 Orthopnea: Secondary | ICD-10-CM | POA: Diagnosis not present

## 2016-11-21 DIAGNOSIS — I5032 Chronic diastolic (congestive) heart failure: Secondary | ICD-10-CM | POA: Diagnosis not present

## 2016-11-21 DIAGNOSIS — Z6835 Body mass index (BMI) 35.0-35.9, adult: Secondary | ICD-10-CM | POA: Diagnosis not present

## 2016-11-21 DIAGNOSIS — I110 Hypertensive heart disease with heart failure: Secondary | ICD-10-CM | POA: Diagnosis not present

## 2016-11-27 DIAGNOSIS — M79672 Pain in left foot: Secondary | ICD-10-CM | POA: Diagnosis not present

## 2016-11-27 DIAGNOSIS — M79671 Pain in right foot: Secondary | ICD-10-CM | POA: Diagnosis not present

## 2016-11-27 DIAGNOSIS — B351 Tinea unguium: Secondary | ICD-10-CM | POA: Diagnosis not present

## 2016-11-29 DIAGNOSIS — M25511 Pain in right shoulder: Secondary | ICD-10-CM | POA: Diagnosis not present

## 2016-11-29 DIAGNOSIS — I10 Essential (primary) hypertension: Secondary | ICD-10-CM | POA: Diagnosis not present

## 2016-11-29 DIAGNOSIS — Z79899 Other long term (current) drug therapy: Secondary | ICD-10-CM | POA: Diagnosis not present

## 2016-11-29 DIAGNOSIS — M19011 Primary osteoarthritis, right shoulder: Secondary | ICD-10-CM | POA: Diagnosis not present

## 2016-12-02 ENCOUNTER — Encounter: Payer: Self-pay | Admitting: Cardiology

## 2016-12-04 DIAGNOSIS — E876 Hypokalemia: Secondary | ICD-10-CM | POA: Diagnosis not present

## 2016-12-04 DIAGNOSIS — I10 Essential (primary) hypertension: Secondary | ICD-10-CM | POA: Diagnosis not present

## 2016-12-04 DIAGNOSIS — E782 Mixed hyperlipidemia: Secondary | ICD-10-CM | POA: Diagnosis not present

## 2016-12-04 DIAGNOSIS — E871 Hypo-osmolality and hyponatremia: Secondary | ICD-10-CM | POA: Diagnosis not present

## 2016-12-04 DIAGNOSIS — E1165 Type 2 diabetes mellitus with hyperglycemia: Secondary | ICD-10-CM | POA: Diagnosis not present

## 2016-12-04 DIAGNOSIS — E039 Hypothyroidism, unspecified: Secondary | ICD-10-CM | POA: Diagnosis not present

## 2016-12-05 DIAGNOSIS — K219 Gastro-esophageal reflux disease without esophagitis: Secondary | ICD-10-CM | POA: Diagnosis not present

## 2016-12-05 DIAGNOSIS — E039 Hypothyroidism, unspecified: Secondary | ICD-10-CM | POA: Diagnosis not present

## 2016-12-05 DIAGNOSIS — D649 Anemia, unspecified: Secondary | ICD-10-CM | POA: Diagnosis not present

## 2016-12-05 DIAGNOSIS — N189 Chronic kidney disease, unspecified: Secondary | ICD-10-CM | POA: Diagnosis not present

## 2016-12-05 DIAGNOSIS — I130 Hypertensive heart and chronic kidney disease with heart failure and stage 1 through stage 4 chronic kidney disease, or unspecified chronic kidney disease: Secondary | ICD-10-CM | POA: Diagnosis not present

## 2016-12-05 DIAGNOSIS — I509 Heart failure, unspecified: Secondary | ICD-10-CM | POA: Diagnosis not present

## 2016-12-05 DIAGNOSIS — E1122 Type 2 diabetes mellitus with diabetic chronic kidney disease: Secondary | ICD-10-CM | POA: Diagnosis not present

## 2016-12-05 DIAGNOSIS — E860 Dehydration: Secondary | ICD-10-CM | POA: Diagnosis not present

## 2016-12-05 LAB — BASIC METABOLIC PANEL
BUN: 39 — AB (ref 4–21)
Creatinine: 2.4 — AB (ref <=1.3)

## 2016-12-05 LAB — TSH: TSH: 12.39 — AB (ref <=5.90)

## 2016-12-08 DIAGNOSIS — E559 Vitamin D deficiency, unspecified: Secondary | ICD-10-CM | POA: Diagnosis not present

## 2016-12-08 DIAGNOSIS — E1165 Type 2 diabetes mellitus with hyperglycemia: Secondary | ICD-10-CM | POA: Diagnosis not present

## 2016-12-08 DIAGNOSIS — D509 Iron deficiency anemia, unspecified: Secondary | ICD-10-CM | POA: Diagnosis not present

## 2016-12-08 DIAGNOSIS — E039 Hypothyroidism, unspecified: Secondary | ICD-10-CM | POA: Diagnosis not present

## 2016-12-08 DIAGNOSIS — Z6836 Body mass index (BMI) 36.0-36.9, adult: Secondary | ICD-10-CM | POA: Diagnosis not present

## 2016-12-08 DIAGNOSIS — E782 Mixed hyperlipidemia: Secondary | ICD-10-CM | POA: Diagnosis not present

## 2016-12-12 DIAGNOSIS — E7800 Pure hypercholesterolemia, unspecified: Secondary | ICD-10-CM | POA: Diagnosis not present

## 2016-12-12 DIAGNOSIS — I130 Hypertensive heart and chronic kidney disease with heart failure and stage 1 through stage 4 chronic kidney disease, or unspecified chronic kidney disease: Secondary | ICD-10-CM | POA: Diagnosis not present

## 2016-12-12 DIAGNOSIS — D649 Anemia, unspecified: Secondary | ICD-10-CM | POA: Diagnosis not present

## 2016-12-12 DIAGNOSIS — N189 Chronic kidney disease, unspecified: Secondary | ICD-10-CM | POA: Diagnosis not present

## 2016-12-12 DIAGNOSIS — M109 Gout, unspecified: Secondary | ICD-10-CM | POA: Diagnosis not present

## 2016-12-12 DIAGNOSIS — R42 Dizziness and giddiness: Secondary | ICD-10-CM | POA: Diagnosis not present

## 2016-12-12 DIAGNOSIS — E039 Hypothyroidism, unspecified: Secondary | ICD-10-CM | POA: Diagnosis not present

## 2016-12-12 DIAGNOSIS — I509 Heart failure, unspecified: Secondary | ICD-10-CM | POA: Diagnosis not present

## 2016-12-12 DIAGNOSIS — E1122 Type 2 diabetes mellitus with diabetic chronic kidney disease: Secondary | ICD-10-CM | POA: Diagnosis not present

## 2016-12-15 ENCOUNTER — Ambulatory Visit: Payer: Self-pay | Admitting: Cardiology

## 2016-12-16 DIAGNOSIS — E041 Nontoxic single thyroid nodule: Secondary | ICD-10-CM | POA: Diagnosis not present

## 2016-12-18 DIAGNOSIS — E782 Mixed hyperlipidemia: Secondary | ICD-10-CM | POA: Diagnosis not present

## 2016-12-18 DIAGNOSIS — Z6836 Body mass index (BMI) 36.0-36.9, adult: Secondary | ICD-10-CM | POA: Diagnosis not present

## 2016-12-18 DIAGNOSIS — E039 Hypothyroidism, unspecified: Secondary | ICD-10-CM | POA: Diagnosis not present

## 2016-12-18 DIAGNOSIS — I5032 Chronic diastolic (congestive) heart failure: Secondary | ICD-10-CM | POA: Diagnosis not present

## 2016-12-18 DIAGNOSIS — I10 Essential (primary) hypertension: Secondary | ICD-10-CM | POA: Diagnosis not present

## 2016-12-18 DIAGNOSIS — I110 Hypertensive heart disease with heart failure: Secondary | ICD-10-CM | POA: Diagnosis not present

## 2016-12-18 DIAGNOSIS — S40862A Insect bite (nonvenomous) of left upper arm, initial encounter: Secondary | ICD-10-CM | POA: Diagnosis not present

## 2016-12-18 DIAGNOSIS — G459 Transient cerebral ischemic attack, unspecified: Secondary | ICD-10-CM | POA: Diagnosis not present

## 2016-12-18 DIAGNOSIS — D509 Iron deficiency anemia, unspecified: Secondary | ICD-10-CM | POA: Diagnosis not present

## 2016-12-18 DIAGNOSIS — E1165 Type 2 diabetes mellitus with hyperglycemia: Secondary | ICD-10-CM | POA: Diagnosis not present

## 2016-12-31 ENCOUNTER — Encounter: Payer: Self-pay | Admitting: *Deleted

## 2016-12-31 DIAGNOSIS — E782 Mixed hyperlipidemia: Secondary | ICD-10-CM | POA: Diagnosis not present

## 2016-12-31 DIAGNOSIS — N184 Chronic kidney disease, stage 4 (severe): Secondary | ICD-10-CM | POA: Diagnosis not present

## 2016-12-31 DIAGNOSIS — E039 Hypothyroidism, unspecified: Secondary | ICD-10-CM | POA: Diagnosis not present

## 2016-12-31 DIAGNOSIS — I10 Essential (primary) hypertension: Secondary | ICD-10-CM | POA: Diagnosis not present

## 2016-12-31 DIAGNOSIS — Z6835 Body mass index (BMI) 35.0-35.9, adult: Secondary | ICD-10-CM | POA: Diagnosis not present

## 2016-12-31 DIAGNOSIS — G4733 Obstructive sleep apnea (adult) (pediatric): Secondary | ICD-10-CM | POA: Diagnosis not present

## 2016-12-31 DIAGNOSIS — D509 Iron deficiency anemia, unspecified: Secondary | ICD-10-CM | POA: Diagnosis not present

## 2016-12-31 DIAGNOSIS — E1122 Type 2 diabetes mellitus with diabetic chronic kidney disease: Secondary | ICD-10-CM | POA: Diagnosis not present

## 2016-12-31 NOTE — Progress Notes (Deleted)
Cardiology Office Note  Date: 12/31/2016   ID: Lorin Picket., DOB 1931-11-19, MRN [id number]  PCP: [name], MD  Primary Cardiologist: [name], MD   No chief complaint on file.   History of Present Illness: Edmund Holcomb. is an 83 y.o. male last seen in October 2016, now referred back to the office by Dr. Pleas Koch. Record review finds ER visit at [facility] in early June with complaints of dizziness. Blood pressure was stable at that time and lab work is reviewed below. Of note, he was placed on levothyroxin due to elevated TSH.  He had a follow-up stress test at [facility] in May as outlined below. Moderate-sized region of inferior scar noted with LVEF 47%. No ischemic territories identified.  I went over his recent lab work.  Past Medical History:  Diagnosis Date  . Coronary atherosclerosis of native coronary artery    4/12 - DES mid RCA, DES distal LAD, DES x2 mid LAD  . Depression   . Essential hypertension, benign   . Gout   . Hearing loss   . Hypertrophy of prostate without urinary obstruction and other lower urinary tract symptoms (LUTS)   . Hypothyroidism   . Incarcerated umbilical hernia   . Mixed hyperlipidemia   . Proteinuria    Mild  . PUD (peptic ulcer disease)   . Stroke [facility])    2008; slight st memory deficirs since last stroke  . Type 2 diabetes mellitus ([place])   . Ventral hernia     Past Surgical History:  Procedure Laterality Date  . CATARACT EXTRACTION W/PHACO Right 08/15/2014   Procedure: CATARACT EXTRACTION PHACO AND INTRAOCULAR LENS PLACEMENT (IOC);  Surgeon: Elta Guadeloupe T. Gershon Crane, MD;  Location: [facility];  Service: Ophthalmology;  Laterality: Right;  CDE:10.77  . HERNIA REPAIR  01/04/14   Dr. Aaron Edelman [place];umbilical  . MULTIPLE TOOTH EXTRACTIONS     HX DENTAL CARIES/ALMOST ALL REMOVED    Current Outpatient Prescriptions  Medication Sig Dispense Refill  . allopurinol (ZYLOPRIM) 300 MG tablet Take  300 mg by mouth daily.    Marland Kitchen amLODipine (NORVASC) 5 MG tablet Take 1 tablet (5 mg total) by mouth daily. 30 tablet 0  . aspirin EC 81 MG tablet Take 81 mg by mouth daily.    Marland Kitchen atorvastatin (LIPITOR) 80 MG tablet TAKE 1 TABLET BY MOUTH AT BEDTIME ( PT NEEDS OFFICE VISIT) 15 tablet 0  . carvedilol (COREG) 12.5 MG tablet TAKE 1 TABLET BY MOUTH TWICE DAILY 60 tablet 0  . clopidogrel (PLAVIX) 75 MG tablet TAKE 1 TABLET BY MOUTH EVERY DAY (PATIENT NEEDS OFFICE VISIT) 30 tablet 3  . LEVEMIR FLEXTOUCH 100 UNIT/ML Pen Inject 14 Units into the skin daily at 10 pm.     . levothyroxine (SYNTHROID, LEVOTHROID) 100 MCG tablet Take 1 tablet (100 mcg total) by mouth daily before breakfast. 30 tablet 0  . lisinopril (PRINIVIL,ZESTRIL) 20 MG tablet TAKE 1 TABLET BY MOUTH EVERY DAY (PT NEEDS OFFICE VISIT) 15 tablet 0  . metFORMIN (GLUCOPHAGE) 1000 MG tablet Take 1,000 mg by mouth 2 (two) times daily with a meal.      . pantoprazole (PROTONIX) 40 MG tablet Take 40 mg by mouth daily.     . tamsulosin (FLOMAX) 0.4 MG CAPS capsule Take 0.4 mg by mouth daily.     No current facility-administered medications for this visit.    Allergies:  Patient has no known allergies.   Social History: The  patient  reports that he has never smoked. He has never used smokeless tobacco. He reports that he does not drink alcohol or use drugs.   Family History: The patient's family history includes Alzheimer's disease in his mother; Colon cancer in his sister; Gout in his father and son; Hyperlipidemia in his son; Hypertension in his son; Other in his brother and daughter.   ROS:  Please see the history of present illness. Otherwise, complete review of systems is positive for {NONE DEFAULTED:18576::"none"}.  All other systems are reviewed and negative.   Physical Exam: VS:  There were no vitals taken for this visit., BMI There is no height or weight on file to calculate BMI.  Wt Readings from Last 3 Encounters:  05/16/15 255 lb 8 oz  (115.9 kg)  05/07/15 261 lb (118.4 kg)  02/08/15 260 lb 5.8 oz (118.1 kg)    Gen: No acute distress.  HEENT: Conjunctiva and lids normal, oropharynx clear.  Neck: Supple, increased girth, no elevated JVP or carotid bruits.  Lungs: Clear to auscultation, decreased at bases, nonlabored breathing at rest.  Cardiac: Regular rate and rhythm, no S3 or significant systolic murmur.  Abdomen: Obese, nontender, bowel sounds present.  Extremities: 2+ edema, distal pulses 2+.   ECG: I personally reviewed the tracing from 05/16/2015 which showed sinus bradycardia with prolonged PR interval and leftward axis.  Recent Labwork:    Component Value Date/Time   CHOL 141 05/17/2015 0536   TRIG 48 05/17/2015 0536   HDL 58 05/17/2015 0536   CHOLHDL 2.4 05/17/2015 0536   VLDL 10 05/17/2015 0536   LDLCALC 73 05/17/2015 0536  June 2018: Hgb 7.4, platelets 281, BUN 31, creatinine 2.1, AST 23, ALT15, potassium 3.06 Nov 2016: cholesterol 135, triglycerides 86, HDL 50, LDL 68, HgbA1c 12.10 December 2016: BUN 39, creatinine 2.36, magnesium 1.3, AST 26, ALT 81, TSH 12.39, hemoglobin 7.4, platelets 280  Other Studies Reviewed Today:  Echocardiogram 02/01/2016 [facility]): Moderate LVH with LVEF>65%, ungraded diastolic dysfunction, mild left atrial enlargement, normal RV contraction, no significant valvular abnormalities, RVSP 37 mmHg.  Lexiscan Myoview 11/20/2016 ([facility]): No diagnostic ST changes. Moderate-sized region of inferior scar as well as inferior septal hypokinesis. LVEF 47%. Described as intermediate risk per radiology staff interpretation. No ischemia was noted however.  Assessment and Plan:     Current medicines were reviewed with the patient today.  No orders of the defined types were placed in this encounter.   Disposition:  Signed, Satira Sark, MD, [facility] 12/31/2016 2:49 PM    [facility] [place], [place], [place]  [address] Phone: [phone]; Fax: [phone]

## 2017-01-01 ENCOUNTER — Ambulatory Visit: Payer: Self-pay | Admitting: Cardiology

## 2017-01-02 ENCOUNTER — Encounter: Payer: Self-pay | Admitting: Cardiology

## 2017-01-13 ENCOUNTER — Ambulatory Visit: Payer: Self-pay | Admitting: "Endocrinology

## 2017-01-29 DIAGNOSIS — E782 Mixed hyperlipidemia: Secondary | ICD-10-CM | POA: Diagnosis not present

## 2017-01-29 DIAGNOSIS — E039 Hypothyroidism, unspecified: Secondary | ICD-10-CM | POA: Diagnosis not present

## 2017-01-29 DIAGNOSIS — E1165 Type 2 diabetes mellitus with hyperglycemia: Secondary | ICD-10-CM | POA: Diagnosis not present

## 2017-01-29 DIAGNOSIS — I10 Essential (primary) hypertension: Secondary | ICD-10-CM | POA: Diagnosis not present

## 2017-01-29 DIAGNOSIS — I110 Hypertensive heart disease with heart failure: Secondary | ICD-10-CM | POA: Diagnosis not present

## 2017-01-29 DIAGNOSIS — E1122 Type 2 diabetes mellitus with diabetic chronic kidney disease: Secondary | ICD-10-CM | POA: Diagnosis not present

## 2017-01-29 DIAGNOSIS — E876 Hypokalemia: Secondary | ICD-10-CM | POA: Diagnosis not present

## 2017-02-02 DIAGNOSIS — E1122 Type 2 diabetes mellitus with diabetic chronic kidney disease: Secondary | ICD-10-CM | POA: Diagnosis not present

## 2017-02-02 DIAGNOSIS — I10 Essential (primary) hypertension: Secondary | ICD-10-CM | POA: Diagnosis not present

## 2017-02-02 DIAGNOSIS — E782 Mixed hyperlipidemia: Secondary | ICD-10-CM | POA: Diagnosis not present

## 2017-02-02 DIAGNOSIS — D509 Iron deficiency anemia, unspecified: Secondary | ICD-10-CM | POA: Diagnosis not present

## 2017-02-02 DIAGNOSIS — I110 Hypertensive heart disease with heart failure: Secondary | ICD-10-CM | POA: Diagnosis not present

## 2017-02-02 DIAGNOSIS — Z6835 Body mass index (BMI) 35.0-35.9, adult: Secondary | ICD-10-CM | POA: Diagnosis not present

## 2017-02-02 DIAGNOSIS — E876 Hypokalemia: Secondary | ICD-10-CM | POA: Diagnosis not present

## 2017-02-02 DIAGNOSIS — G4733 Obstructive sleep apnea (adult) (pediatric): Secondary | ICD-10-CM | POA: Diagnosis not present

## 2017-02-19 ENCOUNTER — Ambulatory Visit: Payer: Self-pay | Admitting: "Endocrinology

## 2017-03-19 DIAGNOSIS — Z23 Encounter for immunization: Secondary | ICD-10-CM | POA: Diagnosis not present

## 2017-03-25 DIAGNOSIS — D649 Anemia, unspecified: Secondary | ICD-10-CM | POA: Diagnosis not present

## 2017-03-25 DIAGNOSIS — I10 Essential (primary) hypertension: Secondary | ICD-10-CM | POA: Diagnosis not present

## 2017-03-25 DIAGNOSIS — E1165 Type 2 diabetes mellitus with hyperglycemia: Secondary | ICD-10-CM | POA: Diagnosis not present

## 2017-03-25 DIAGNOSIS — E7800 Pure hypercholesterolemia, unspecified: Secondary | ICD-10-CM | POA: Diagnosis not present

## 2017-03-30 ENCOUNTER — Encounter: Payer: Self-pay | Admitting: "Endocrinology

## 2017-03-30 ENCOUNTER — Ambulatory Visit (INDEPENDENT_AMBULATORY_CARE_PROVIDER_SITE_OTHER): Payer: Medicare HMO | Admitting: "Endocrinology

## 2017-03-30 VITALS — BP 135/66 | HR 61 | Ht 74.0 in | Wt 263.0 lb

## 2017-03-30 DIAGNOSIS — Z6833 Body mass index (BMI) 33.0-33.9, adult: Secondary | ICD-10-CM

## 2017-03-30 DIAGNOSIS — E039 Hypothyroidism, unspecified: Secondary | ICD-10-CM | POA: Diagnosis not present

## 2017-03-30 DIAGNOSIS — E1159 Type 2 diabetes mellitus with other circulatory complications: Secondary | ICD-10-CM

## 2017-03-30 DIAGNOSIS — Z794 Long term (current) use of insulin: Secondary | ICD-10-CM | POA: Diagnosis not present

## 2017-03-30 DIAGNOSIS — N183 Chronic kidney disease, stage 3 unspecified: Secondary | ICD-10-CM

## 2017-03-30 DIAGNOSIS — I10 Essential (primary) hypertension: Secondary | ICD-10-CM

## 2017-03-30 DIAGNOSIS — E6609 Other obesity due to excess calories: Secondary | ICD-10-CM | POA: Diagnosis not present

## 2017-03-30 DIAGNOSIS — D519 Vitamin B12 deficiency anemia, unspecified: Secondary | ICD-10-CM | POA: Diagnosis not present

## 2017-03-30 DIAGNOSIS — E782 Mixed hyperlipidemia: Secondary | ICD-10-CM | POA: Diagnosis not present

## 2017-03-30 DIAGNOSIS — E1122 Type 2 diabetes mellitus with diabetic chronic kidney disease: Secondary | ICD-10-CM

## 2017-03-30 HISTORY — DX: Type 2 diabetes mellitus with other circulatory complications: E11.59

## 2017-03-30 LAB — BASIC METABOLIC PANEL
BUN: 31 — AB (ref 4–21)
CREATININE: 1.6 — AB (ref <=1.3)

## 2017-03-30 LAB — HEMOGLOBIN A1C: HEMOGLOBIN A1C: 8

## 2017-03-30 LAB — TSH: TSH: 0.01 — AB (ref <=5.90)

## 2017-03-30 NOTE — Progress Notes (Signed)
Subjective:    Patient ID: Paul Picket., male    DOB: Oct 03, 1931.  he is being seen in consultation for management of currently uncontrolled symptomatic diabetes requested by  [name], MD.   Past Medical History:  Diagnosis Date  . Coronary atherosclerosis of native coronary artery    4/12 - DES mid RCA, DES distal LAD, DES x2 mid LAD  . Depression   . Essential hypertension, benign   . Gout   . Hearing loss   . Hypertrophy of prostate without urinary obstruction and other lower urinary tract symptoms (LUTS)   . Hypothyroidism   . Incarcerated umbilical hernia   . Mixed hyperlipidemia   . Proteinuria    Mild  . PUD (peptic ulcer disease)   . Stroke [facility])    2008; slight st memory deficirs since last stroke  . Type 2 diabetes mellitus ([place])   . Ventral hernia    Past Surgical History:  Procedure Laterality Date  . CATARACT EXTRACTION W/PHACO Right 08/15/2014   Procedure: CATARACT EXTRACTION PHACO AND INTRAOCULAR LENS PLACEMENT (IOC);  Surgeon: Elta Guadeloupe T. Gershon Crane, MD;  Location: [facility];  Service: Ophthalmology;  Laterality: Right;  CDE:10.77  . HERNIA REPAIR  01/04/14   Dr. Aaron Edelman [place];umbilical  . MULTIPLE TOOTH EXTRACTIONS     HX DENTAL CARIES/ALMOST ALL REMOVED   Social History   Social History  . Marital status: Married    Spouse name: N/A  . Number of children: 4  . Years of education: 9th   Occupational History  . Retired    Social History Main Topics  . Smoking status: Never Smoker  . Smokeless tobacco: Never Used  . Alcohol use No  . Drug use: No  . Sexual activity: Yes    Birth control/ protection: None   Other Topics Concern  . 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   Outpatient Encounter Prescriptions as of 03/30/2017  Medication Sig  . allopurinol (ZYLOPRIM) 300 MG tablet Take 300 mg by mouth daily.  Marland Kitchen amLODipine (NORVASC) 5 MG tablet Take 1 tablet (5 mg  total) by mouth daily.  Marland Kitchen aspirin EC 81 MG tablet Take 81 mg by mouth daily.  Marland Kitchen atorvastatin (LIPITOR) 80 MG tablet TAKE 1 TABLET BY MOUTH AT BEDTIME ( PT NEEDS OFFICE VISIT)  . carvedilol (COREG) 12.5 MG tablet TAKE 1 TABLET BY MOUTH TWICE DAILY  . clopidogrel (PLAVIX) 75 MG tablet TAKE 1 TABLET BY MOUTH EVERY DAY (PATIENT NEEDS OFFICE VISIT)  . doxazosin (CARDURA) 2 MG tablet Take 2 mg by mouth daily.  . ferrous sulfate 325 (65 FE) MG tablet Take 325 mg by mouth daily with breakfast.  . furosemide (LASIX) 20 MG tablet Take 20 mg by mouth 2 (two) times daily.  . Insulin Glargine (LANTUS SOLOSTAR) 100 UNIT/ML Solostar Pen Inject 20 Units into the skin at bedtime.  Marland Kitchen levothyroxine (SYNTHROID, LEVOTHROID) 100 MCG tablet Take 1 tablet (100 mcg total) by mouth daily before breakfast.  . lisinopril (PRINIVIL,ZESTRIL) 20 MG tablet TAKE 1 TABLET BY MOUTH EVERY DAY (PT NEEDS OFFICE VISIT)  . meclizine (ANTIVERT) 25 MG tablet Take 25 mg by mouth 3 (three) times daily as needed for dizziness.  . pantoprazole (PROTONIX) 40 MG tablet Take 40 mg by mouth daily.   . tamsulosin (FLOMAX) 0.4 MG CAPS capsule Take 0.4 mg by mouth daily.  . Vitamin D, Ergocalciferol, (DRISDOL) 50000 units  CAPS capsule Take 50,000 Units by mouth every 7 (seven) days.  . [DISCONTINUED] LEVEMIR FLEXTOUCH 100 UNIT/ML Pen Inject 14 Units into the skin daily at 10 pm.   . [DISCONTINUED] metFORMIN (GLUCOPHAGE) 1000 MG tablet Take 1,000 mg by mouth 2 (two) times daily with a meal.     No facility-administered encounter medications on file as of 03/30/2017.     ALLERGIES: No Known Allergies  VACCINATION STATUS: Immunization History  Administered Date(s) Administered  . Influenza,inj,Quad PF,6+ Mos 05/07/2015    Diabetes  He presents for his initial diabetic visit. He has type 2 diabetes mellitus. Onset time: She was diagnosed at approximate age of 6 years. His disease course has been worsening. There are no hypoglycemic  associated symptoms. Pertinent negatives for hypoglycemia include no confusion, headaches, pallor or seizures. Associated symptoms include blurred vision, foot paresthesias, polydipsia and polyuria. Pertinent negatives for diabetes include no chest pain, no fatigue, no polyphagia and no weakness. There are no hypoglycemic complications. Symptoms are worsening. Diabetic complications include a CVA, nephropathy, peripheral neuropathy and retinopathy. Risk factors for coronary artery disease include dyslipidemia, diabetes mellitus, hypertension, male sex, obesity and sedentary lifestyle. Current diabetic treatment includes insulin injections. His weight is stable. He is following a generally unhealthy diet. When asked about meal planning, he reported none. He has not had a previous visit with a dietitian. He never participates in exercise. (Patient is elderly with minimal social support. He did not bring any meter nor logs to review. He is accompanied by his son-in-law does not know the details of  his care.) Eye exam is current.  Hyperlipidemia  This is a chronic problem. The current episode started more than 1 year ago. Exacerbating diseases include diabetes, hypothyroidism and obesity. Pertinent negatives include no chest pain, myalgias or shortness of breath. Current antihyperlipidemic treatment includes statins. Risk factors for coronary artery disease include diabetes mellitus, dyslipidemia, hypertension, male sex, obesity and a sedentary lifestyle.  Hypertension  This is a chronic problem. The current episode started more than 1 year ago. The problem is controlled. Associated symptoms include blurred vision. Pertinent negatives include no chest pain, headaches, neck pain, palpitations or shortness of breath. Risk factors for coronary artery disease include dyslipidemia, diabetes mellitus, male gender, obesity and sedentary lifestyle. Hypertensive end-organ damage includes CVA and retinopathy.     Review  of Systems  Constitutional: Negative for chills, fatigue, fever and unexpected weight change.  HENT: Negative for dental problem, mouth sores and trouble swallowing.   Eyes: Positive for blurred vision. Negative for visual disturbance.  Respiratory: Negative for cough, choking, chest tightness, shortness of breath and wheezing.   Cardiovascular: Negative for chest pain, palpitations and leg swelling.  Gastrointestinal: Negative for abdominal distention, abdominal pain, constipation, diarrhea, nausea and vomiting.  Endocrine: Positive for polydipsia and polyuria. Negative for polyphagia.  Genitourinary: Negative for dysuria, flank pain, hematuria and urgency.  Musculoskeletal: Positive for gait problem. Negative for back pain, myalgias and neck pain.       He has history of CVA with no apparent residual must weakness , he walks with a cane.  Skin: Negative for pallor, rash and wound.  Neurological: Negative for seizures, syncope, weakness, numbness and headaches.  Psychiatric/Behavioral: Negative.  Negative for confusion and dysphoric mood.    Objective:    BP 135/66   Pulse 61   Ht 6\' 2"  (1.88 m)   Wt 263 lb (119.3 kg)   BMI 33.77 kg/m   Wt Readings from Last 3 Encounters:  03/30/17 263 lb (119.3 kg)  05/16/15 255 lb 8 oz (115.9 kg)  05/07/15 261 lb (118.4 kg)     Physical Exam  Constitutional: He is oriented to person, place, and time. He appears well-developed and well-nourished. He is cooperative. No distress.  HENT:  Head: Normocephalic and atraumatic.  Eyes: EOM are normal.  Neck: Normal range of motion. Neck supple. No tracheal deviation present. No thyromegaly present.  Cardiovascular: Normal rate, S1 normal, S2 normal and normal heart sounds.  Exam reveals no gallop.   No murmur heard. Pulses:      Dorsalis pedis pulses are 1+ on the right side, and 1+ on the left side.       Posterior tibial pulses are 1+ on the right side, and 1+ on the left side.  Pulmonary/Chest:  Breath sounds normal. No respiratory distress. He has no wheezes.  Abdominal: Soft. Bowel sounds are normal. He exhibits no distension. There is no tenderness. There is no guarding and no CVA tenderness.  Musculoskeletal: He exhibits edema.       Right shoulder: He exhibits no swelling and no deformity.  Neurological: He is alert and oriented to person, place, and time. He has normal strength and normal reflexes. No cranial nerve deficit or sensory deficit. Gait normal.  Skin: Skin is warm and dry. No rash noted. No cyanosis. Nails show no clubbing.  Psychiatric: He has a normal mood and affect. His speech is normal. Cognition and memory are normal.    CMP ( most recent) CMP     Component Value Date/Time   NA 139 05/18/2015 0603   K 4.5 05/18/2015 0603   CL 111 05/18/2015 0603   CO2 23 05/18/2015 0603   GLUCOSE 106 (H) 05/18/2015 0603   BUN 39 (A) 12/05/2016   CREATININE 2.4 (A) 12/05/2016   CREATININE 1.46 (H) 05/18/2015 0603   CALCIUM 8.2 (L) 05/18/2015 0603   PROT 6.6 05/16/2015 1706   ALBUMIN 3.7 05/16/2015 1706   AST 20 05/16/2015 1706   ALT 12 (L) 05/16/2015 1706   ALKPHOS 66 05/16/2015 1706   BILITOT 0.3 05/16/2015 1706   GFRNONAA 43 (L) 05/18/2015 0603   GFRAA 50 (L) 05/18/2015 0603     Diabetic Labs (most recent): Lab Results  Component Value Date   HGBA1C 7.0 (H) 05/17/2015   HGBA1C 8.9 01/02/2009   HGBA1C (H) 12/02/2008    9.2 (NOTE) The ADA recommends the following therapeutic goal for glycemic control related to Hgb A1c measurement: Goal of therapy: <6.5 Hgb A1c  Reference: American Diabetes Association: Clinical Practice Recommendations 2010, Diabetes Care, 2010, 33: (Suppl  1).     Lipid Panel ( most recent) Lipid Panel     Component Value Date/Time   CHOL 141 05/17/2015 0536   TRIG 48 05/17/2015 0536   HDL 58 05/17/2015 0536   CHOLHDL 2.4 05/17/2015 0536   VLDL 10 05/17/2015 0536   LDLCALC 73 05/17/2015 0536      Assessment & Plan:   1. Type  2 diabetes mellitus with stage 3 chronic kidney disease, CVA with long-term current use of insulin ([place])  - Patient has currently uncontrolled symptomatic type 2 DM since  83 years of age. - No recent labs to review. He will be sent to lab for new set of labs including A1c and thyroid function test. -his diabetes is complicated by stage 3-4 renal insufficiency, cerebrovascular accident, retinopathy, obesity/sedentary life and [place]. remains at a high risk for more acute and chronic  complications which include CAD, CVA, CKD, retinopathy, and neuropathy. These are all discussed in detail with the patient.  - I have counseled him on diet management and weight loss, by adopting a carbohydrate restricted/protein rich diet.  - Suggestion is made for him to avoid simple carbohydrates  from his diet including Cakes, Sweet Desserts, Ice Cream, Soda (diet and regular), Sweet Tea, Candies, Chips, Cookies, Store Bought Juices, Alcohol in Excess of  1-2 drinks a day, Artificial Sweeteners, and "Sugar-free" Products. This will help patient to have stable blood glucose profile and potentially avoid unintended weight gain.  - I encouraged him to switch to  unprocessed or minimally processed complex starch and increased protein intake (animal or plant source), fruits, and vegetables.  - he is advised to stick to a routine mealtimes to eat 3 meals  a day and avoid unnecessary snacks ( to snack only to correct hypoglycemia).   - he will be scheduled with Jearld Fenton, RDN, CDE for individualized DM education.  - I have approached him with the following individualized plan to manage diabetes and patient agrees:   -  I  will proceed with his current basal inLantus 20 units daily at bedtime , associated with strict monitoring of glucose 4 times a day-before meals and at bedtime. - If she returns with significantly above target glycemic profile and high A1c, he would be considered for more insulin treatment  . - Patient is warned not to take insulin without proper monitoring per orders.  -Patient is encouraged to call clinic for blood glucose levels less than 70 or above 300 mg /dl. -Patient is not a candidate for metformin, SGLT2 inhibitors due to CKD. - he will be considered for incretin therapy as appropriate next visit. - Patient specific target  A1c;  LDL, HDL, Triglycerides, and  Waist Circumference were discussed in detail.  2) BP/HTN:  controlled. Continue current medications. 3) Lipids/HPL:   controlled with LDL 73.   Patient is advised to continue statins. 4)  Weight/Diet: CDE Consult will be initiated , exercise, and detailed carbohydrates information provided.  5) hypothyroidism: -He is A1c from 12/05/2016 was 12.39, significantly above target. - He is advised to remain on his current dose of levothyroxine 100 g by mouth every morning. He will have thyroid function tests as part of his labs today.  - We discussed about correct intake of levothyroxine, at fasting, with water, separated by at least 30 minutes from breakfast, and separated by more than 4 hours from calcium, iron, multivitamins, acid reflux medications (PPIs). -Patient is made aware of the fact that thyroid hormone replacement is needed for life, dose to be adjusted by periodic monitoring of thyroid function tests.  6) Chronic Care/Health Maintenance:  -he  is on Statin medications and  is encouraged to continue to follow up with Ophthalmology, Dentist,  Podiatrist at least yearly or according to recommendations, and advised to   stay away from smoking. I have recommended yearly flu vaccine and pneumonia vaccination at least every 5 years; moderate intensity exercise for up to 150 minutes weekly; and  sleep for at least 7 hours a day.  - Time spent with the patient: 1 hour, of which >50% was spent in obtaining information about his symptoms, reviewing his previous labs, evaluations, and treatments, counseling him about his   currently uncontrolled, complicated type 2 diabetes, hypothyroidism, hyperlipidemia, hypertension, and developing a plan for long term treatment; he had a number of questions which I addressed.  - Patient to  bring meter and  blood glucose logs during his next visit.  - I advised patient to maintain close follow up with [place], Virgina Evener, MD for primary care needs.  Follow up plan: - Return in about 1 week (around 04/06/2017) for labs today, follow up with pre-visit labs, meter, and logs.  Glade Lloyd, MD Phone: [phone]  Fax: [phone]   03/30/2017, 1:10 PM This note was partially dictated with voice recognition software. Similar sounding words can be transcribed inadequately or may not  be corrected upon review.

## 2017-03-30 NOTE — Patient Instructions (Signed)

## 2017-03-31 DIAGNOSIS — E1122 Type 2 diabetes mellitus with diabetic chronic kidney disease: Secondary | ICD-10-CM | POA: Diagnosis not present

## 2017-03-31 DIAGNOSIS — I5032 Chronic diastolic (congestive) heart failure: Secondary | ICD-10-CM | POA: Diagnosis not present

## 2017-03-31 DIAGNOSIS — I110 Hypertensive heart disease with heart failure: Secondary | ICD-10-CM | POA: Diagnosis not present

## 2017-03-31 DIAGNOSIS — G4733 Obstructive sleep apnea (adult) (pediatric): Secondary | ICD-10-CM | POA: Diagnosis not present

## 2017-03-31 DIAGNOSIS — D509 Iron deficiency anemia, unspecified: Secondary | ICD-10-CM | POA: Diagnosis not present

## 2017-03-31 DIAGNOSIS — I63532 Cerebral infarction due to unspecified occlusion or stenosis of left posterior cerebral artery: Secondary | ICD-10-CM | POA: Diagnosis not present

## 2017-03-31 DIAGNOSIS — M1000 Idiopathic gout, unspecified site: Secondary | ICD-10-CM | POA: Diagnosis not present

## 2017-03-31 DIAGNOSIS — E782 Mixed hyperlipidemia: Secondary | ICD-10-CM | POA: Diagnosis not present

## 2017-03-31 DIAGNOSIS — Z0001 Encounter for general adult medical examination with abnormal findings: Secondary | ICD-10-CM | POA: Diagnosis not present

## 2017-03-31 DIAGNOSIS — I10 Essential (primary) hypertension: Secondary | ICD-10-CM | POA: Diagnosis not present

## 2017-04-08 ENCOUNTER — Encounter: Payer: Self-pay | Admitting: "Endocrinology

## 2017-04-08 ENCOUNTER — Ambulatory Visit (INDEPENDENT_AMBULATORY_CARE_PROVIDER_SITE_OTHER): Payer: Medicare HMO | Admitting: "Endocrinology

## 2017-04-08 VITALS — BP 141/65 | HR 56 | Ht 74.0 in | Wt 260.0 lb

## 2017-04-08 DIAGNOSIS — I10 Essential (primary) hypertension: Secondary | ICD-10-CM

## 2017-04-08 DIAGNOSIS — E782 Mixed hyperlipidemia: Secondary | ICD-10-CM

## 2017-04-08 DIAGNOSIS — Z6833 Body mass index (BMI) 33.0-33.9, adult: Secondary | ICD-10-CM | POA: Diagnosis not present

## 2017-04-08 DIAGNOSIS — E1122 Type 2 diabetes mellitus with diabetic chronic kidney disease: Secondary | ICD-10-CM

## 2017-04-08 DIAGNOSIS — E039 Hypothyroidism, unspecified: Secondary | ICD-10-CM | POA: Diagnosis not present

## 2017-04-08 DIAGNOSIS — E6609 Other obesity due to excess calories: Secondary | ICD-10-CM

## 2017-04-08 DIAGNOSIS — N183 Chronic kidney disease, stage 3 (moderate): Secondary | ICD-10-CM | POA: Diagnosis not present

## 2017-04-08 DIAGNOSIS — Z794 Long term (current) use of insulin: Secondary | ICD-10-CM | POA: Diagnosis not present

## 2017-04-08 NOTE — Progress Notes (Signed)
Subjective:    Patient ID: Paul Wood., male    DOB: 1932-04-23.  he is being seen in consultation for management of currently uncontrolled symptomatic diabetes requested by  [name], MD.   Past Medical History:  Diagnosis Date  . Coronary atherosclerosis of native coronary artery    4/12 - DES mid RCA, DES distal LAD, DES x2 mid LAD  . Depression   . Essential hypertension, benign   . Gout   . Hearing loss   . Hypertrophy of prostate without urinary obstruction and other lower urinary tract symptoms (LUTS)   . Hypothyroidism   . Incarcerated umbilical hernia   . Mixed hyperlipidemia   . Proteinuria    Mild  . PUD (peptic ulcer disease)   . Stroke [facility])    2008; slight st memory deficirs since last stroke  . Type 2 diabetes mellitus ([place])   . Ventral hernia    Past Surgical History:  Procedure Laterality Date  . CATARACT EXTRACTION W/PHACO Right 08/15/2014   Procedure: CATARACT EXTRACTION PHACO AND INTRAOCULAR LENS PLACEMENT (IOC);  Surgeon: Elta Guadeloupe T. Gershon Crane, MD;  Location: [facility];  Service: Ophthalmology;  Laterality: Right;  CDE:10.77  . HERNIA REPAIR  01/04/14   Dr. Aaron Edelman [place];umbilical  . MULTIPLE TOOTH EXTRACTIONS     HX DENTAL CARIES/ALMOST ALL REMOVED   Social History   Social History  . Marital status: Married    Spouse name: N/A  . Number of children: 4  . Years of education: 9th   Occupational History  . Retired    Social History Main Topics  . Smoking status: Never Smoker  . Smokeless tobacco: Never Used  . Alcohol use No  . Drug use: No  . Sexual activity: Yes    Birth control/ protection: None   Other Topics Concern  . 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   Outpatient Encounter Prescriptions as of 04/08/2017  Medication Sig  . allopurinol (ZYLOPRIM) 300 MG tablet Take 300 mg by mouth daily.  Marland Kitchen amLODipine (NORVASC) 5 MG tablet Take 1 tablet (5 mg  total) by mouth daily.  Marland Kitchen aspirin EC 81 MG tablet Take 81 mg by mouth daily.  Marland Kitchen atorvastatin (LIPITOR) 80 MG tablet TAKE 1 TABLET BY MOUTH AT BEDTIME ( PT NEEDS OFFICE VISIT)  . carvedilol (COREG) 12.5 MG tablet TAKE 1 TABLET BY MOUTH TWICE DAILY  . clopidogrel (PLAVIX) 75 MG tablet TAKE 1 TABLET BY MOUTH EVERY DAY (PATIENT NEEDS OFFICE VISIT)  . doxazosin (CARDURA) 2 MG tablet Take 2 mg by mouth daily.  . ferrous sulfate 325 (65 FE) MG tablet Take 325 mg by mouth daily with breakfast.  . furosemide (LASIX) 20 MG tablet Take 20 mg by mouth 2 (two) times daily.  . Insulin Glargine (LANTUS SOLOSTAR) 100 UNIT/ML Solostar Pen Inject 20 Units into the skin at bedtime.  Marland Kitchen levothyroxine (SYNTHROID, LEVOTHROID) 100 MCG tablet Take 1 tablet (100 mcg total) by mouth daily before breakfast.  . lisinopril (PRINIVIL,ZESTRIL) 20 MG tablet TAKE 1 TABLET BY MOUTH EVERY DAY (PT NEEDS OFFICE VISIT)  . meclizine (ANTIVERT) 25 MG tablet Take 25 mg by mouth 3 (three) times daily as needed for dizziness.  . pantoprazole (PROTONIX) 40 MG tablet Take 40 mg by mouth daily.   . tamsulosin (FLOMAX) 0.4 MG CAPS capsule Take 0.4 mg by mouth daily.  . Vitamin D, Ergocalciferol, (DRISDOL) 50000 units  CAPS capsule Take 50,000 Units by mouth every 7 (seven) days.   No facility-administered encounter medications on file as of 04/08/2017.     ALLERGIES: No Known Allergies  VACCINATION STATUS: Immunization History  Administered Date(s) Administered  . Influenza,inj,Quad PF,6+ Mos 05/07/2015    Diabetes  He presents for his follow-up diabetic visit. He has type 2 diabetes mellitus. Onset time: She was diagnosed at approximate age of 83 years. His disease course has been worsening. There are no hypoglycemic associated symptoms. Pertinent negatives for hypoglycemia include no confusion, headaches, pallor or seizures. Associated symptoms include blurred vision, foot paresthesias, polydipsia and polyuria. Pertinent negatives for  diabetes include no chest pain, no fatigue, no polyphagia and no weakness. There are no hypoglycemic complications. Symptoms are worsening. Diabetic complications include a CVA, nephropathy, peripheral neuropathy and retinopathy. Risk factors for coronary artery disease include dyslipidemia, diabetes mellitus, hypertension, male sex, obesity and sedentary lifestyle. Current diabetic treatment includes insulin injections. His weight is stable. He is following a generally unhealthy diet. When asked about meal planning, he reported none. He has not had a previous visit with a dietitian. He never participates in exercise. (Patient is elderly with minimal social support. One of his sons came with him ( son-in-law during last visit), but he does not involve in his father's care.  He did not bring any meter nor logs to review.  -He is A1c is 8% on repeat labs.) Eye exam is current.  Hyperlipidemia  This is a chronic problem. The current episode started more than 1 year ago. Exacerbating diseases include diabetes, hypothyroidism and obesity. Pertinent negatives include no chest pain, myalgias or shortness of breath. Current antihyperlipidemic treatment includes statins. Risk factors for coronary artery disease include diabetes mellitus, dyslipidemia, hypertension, male sex, obesity and a sedentary lifestyle.  Hypertension  This is a chronic problem. The current episode started more than 1 year ago. The problem is controlled. Associated symptoms include blurred vision. Pertinent negatives include no chest pain, headaches, neck pain, palpitations or shortness of breath. Risk factors for coronary artery disease include dyslipidemia, diabetes mellitus, male gender, obesity and sedentary lifestyle. Hypertensive end-organ damage includes CVA and retinopathy.     Review of Systems  Constitutional: Negative for chills, fatigue, fever and unexpected weight change.  HENT: Negative for dental problem, mouth sores and  trouble swallowing.   Eyes: Positive for blurred vision. Negative for visual disturbance.  Respiratory: Negative for cough, choking, chest tightness, shortness of breath and wheezing.   Cardiovascular: Negative for chest pain, palpitations and leg swelling.  Gastrointestinal: Negative for abdominal distention, abdominal pain, constipation, diarrhea, nausea and vomiting.  Endocrine: Positive for polydipsia and polyuria. Negative for polyphagia.  Genitourinary: Negative for dysuria, flank pain, hematuria and urgency.  Musculoskeletal: Positive for gait problem. Negative for back pain, myalgias and neck pain.       He has history of CVA with no apparent residual must weakness , he walks with a cane.  Skin: Negative for pallor, rash and wound.  Neurological: Negative for seizures, syncope, weakness, numbness and headaches.  Psychiatric/Behavioral: Negative.  Negative for confusion and dysphoric mood.    Objective:    BP (!) 141/65   Pulse (!) 56   Ht 6\' 2"  (1.88 m)   Wt 260 lb (117.9 kg)   BMI 33.38 kg/m   Wt Readings from Last 3 Encounters:  04/08/17 260 lb (117.9 kg)  03/30/17 263 lb (119.3 kg)  05/16/15 255 lb 8 oz (115.9 kg)  Physical Exam  Constitutional: He is oriented to person, place, and time. He appears well-developed and well-nourished. He is cooperative. No distress.  HENT:  Head: Normocephalic and atraumatic.  Eyes: EOM are normal.  Neck: Normal range of motion. Neck supple. No tracheal deviation present. No thyromegaly present.  Cardiovascular: Normal rate, S1 normal, S2 normal and normal heart sounds.  Exam reveals no gallop.   No murmur heard. Pulses:      Dorsalis pedis pulses are 1+ on the right side, and 1+ on the left side.       Posterior tibial pulses are 1+ on the right side, and 1+ on the left side.  Pulmonary/Chest: Breath sounds normal. No respiratory distress. He has no wheezes.  Abdominal: Soft. Bowel sounds are normal. He exhibits no distension.  There is no tenderness. There is no guarding and no CVA tenderness.  Musculoskeletal: He exhibits edema.       Right shoulder: He exhibits no swelling and no deformity.  Neurological: He is alert and oriented to person, place, and time. He has normal strength and normal reflexes. No cranial nerve deficit or sensory deficit. Gait normal.  Skin: Skin is warm and dry. No rash noted. No cyanosis. Nails show no clubbing.  Psychiatric: He has a normal mood and affect. His speech is normal. Cognition and memory are normal.    CMP ( most recent) CMP     Component Value Date/Time   NA 139 05/18/2015 0603   K 4.5 05/18/2015 0603   CL 111 05/18/2015 0603   CO2 23 05/18/2015 0603   GLUCOSE 106 (H) 05/18/2015 0603   BUN 31 (A) 03/30/2017   CREATININE 1.6 (A) 03/30/2017   CREATININE 1.46 (H) 05/18/2015 0603   CALCIUM 8.2 (L) 05/18/2015 0603   PROT 6.6 05/16/2015 1706   ALBUMIN 3.7 05/16/2015 1706   AST 20 05/16/2015 1706   ALT 12 (L) 05/16/2015 1706   ALKPHOS 66 05/16/2015 1706   BILITOT 0.3 05/16/2015 1706   GFRNONAA 43 (L) 05/18/2015 0603   GFRAA 50 (L) 05/18/2015 0603     Diabetic Labs (most recent): Lab Results  Component Value Date   HGBA1C 8 03/30/2017   HGBA1C 7.0 (H) 05/17/2015   HGBA1C 8.9 01/02/2009     Lipid Panel ( most recent) Lipid Panel     Component Value Date/Time   CHOL 141 05/17/2015 0536   TRIG 48 05/17/2015 0536   HDL 58 05/17/2015 0536   CHOLHDL 2.4 05/17/2015 0536   VLDL 10 05/17/2015 0536   LDLCALC 73 05/17/2015 0536      Assessment & Plan:   1. Type 2 diabetes mellitus with stage 3 chronic kidney disease, CVA with long-term current use of insulin ([place])  - Patient has currently uncontrolled symptomatic type 2 DM since  83 years of age. - Recent labs show A1c at 8%. -his diabetes is complicated by stage 3-4 renal insufficiency, cerebrovascular accident, retinopathy, obesity/sedentary life and [place]. remains at a high risk for more acute  and chronic complications which include CAD, CVA, CKD, retinopathy, and neuropathy. These are all discussed in detail with the patient.  - I have counseled him on diet management and weight loss, by adopting a carbohydrate restricted/protein rich diet.  - Suggestion is made for him to avoid simple carbohydrates  from his diet including Cakes, Sweet Desserts, Ice Cream, Soda (diet and regular), Sweet Tea, Candies, Chips, Cookies, Store Bought Juices, Alcohol in Excess of  1-2 drinks a day, Artificial Sweeteners,  and "Sugar-free" Products. This will help patient to have stable blood glucose profile and potentially avoid unintended weight gain.  - I encouraged him to switch to  unprocessed or minimally processed complex starch and increased protein intake (animal or plant source), fruits, and vegetables.  - he is advised to stick to a routine mealtimes to eat 3 meals  a day and avoid unnecessary snacks ( to snack only to correct hypoglycemia).   - I have approached him with the following individualized plan to manage diabetes and patient agrees:   -  The patient has minimal social support. The #1 goal in his care would be to avoid hypoglycemia. - Target A1c for him will be between 7.5-8%, which he is very close to,  with A1c of 8%. - I  will proceed with his current basal inLantus 20 units daily at bedtime , associated with strict monitoring of glucose  at least 2 times a day-daily before breakfast and at bedtime.   - Patient is warned not to take insulin without proper monitoring per orders.  -Patient is encouraged to call clinic for blood glucose levels less than 70 or above 300 mg /dl. -Patient is not a candidate for metformin, SGLT2 inhibitors due to CKD. - he will be considered for incretin therapy as appropriate next visit. - Patient specific target  A1c;  LDL, HDL, Triglycerides, and  Waist Circumference were discussed in detail.  2) BP/HTN:  controlled. Continue current medications. 3)  Lipids/HPL:   controlled with LDL 73.   Patient is advised to continue statins. 4)  Weight/Diet: CDE Consult will be initiated , exercise, and detailed carbohydrates information provided.  5) hypothyroidism: -His repeat labs show better TSH of 0.014, free T4 within target at 1.58. Suspicious for secondary hypothyroidism.   - He is advised to remain on his current dose of levothyroxine 100 g by mouth every morning. He will have thyroid function tests as part of his labs today.  - We discussed about correct intake of levothyroxine, at fasting, with water, separated by at least 30 minutes from breakfast, and separated by more than 4 hours from calcium, iron, multivitamins, acid reflux medications (PPIs). -Patient is made aware of the fact that thyroid hormone replacement is needed for life, dose to be adjusted by periodic monitoring of thyroid function tests.  6) Chronic Care/Health Maintenance:  -he  is on Statin medications and  is encouraged to continue to follow up with Ophthalmology, Dentist,  Podiatrist at least yearly or according to recommendations, and advised to   stay away from smoking. I have recommended yearly flu vaccine and pneumonia vaccination at least every 5 years; moderate intensity exercise for up to 150 minutes weekly; and  sleep for at least 7 hours a day.  - Time spent with the patient: 25 min, of which >50% was spent in reviewing his sugar logs , discussing his hypo- and hyper-glycemic episodes, reviewing his current and  previous labs and insulin doses and developing a plan to avoid hypo- and hyper-glycemia.    - I advised patient to maintain close follow up with Burdine, Virgina Evener, MD for primary care needs.  Follow up plan: - Return in about 3 months (around 07/09/2017) for meter, and logs.  Glade Lloyd, MD Phone: [phone]  Fax: [phone]   04/08/2017, 1:12 PM This note was partially dictated with voice recognition software. Similar sounding words can be  transcribed inadequately or may not  be corrected upon review.

## 2017-04-08 NOTE — Patient Instructions (Signed)

## 2017-05-04 ENCOUNTER — Ambulatory Visit: Payer: Self-pay | Admitting: Nutrition

## 2017-05-06 ENCOUNTER — Encounter: Payer: Medicare HMO | Attending: Family Medicine | Admitting: Nutrition

## 2017-05-06 VITALS — Wt 265.0 lb

## 2017-05-06 DIAGNOSIS — IMO0002 Reserved for concepts with insufficient information to code with codable children: Secondary | ICD-10-CM

## 2017-05-06 DIAGNOSIS — E118 Type 2 diabetes mellitus with unspecified complications: Secondary | ICD-10-CM

## 2017-05-06 DIAGNOSIS — Z6834 Body mass index (BMI) 34.0-34.9, adult: Secondary | ICD-10-CM | POA: Insufficient documentation

## 2017-05-06 DIAGNOSIS — N183 Chronic kidney disease, stage 3 (moderate): Secondary | ICD-10-CM | POA: Insufficient documentation

## 2017-05-06 DIAGNOSIS — E1165 Type 2 diabetes mellitus with hyperglycemia: Secondary | ICD-10-CM

## 2017-05-06 DIAGNOSIS — E669 Obesity, unspecified: Secondary | ICD-10-CM

## 2017-05-06 DIAGNOSIS — E1122 Type 2 diabetes mellitus with diabetic chronic kidney disease: Secondary | ICD-10-CM | POA: Insufficient documentation

## 2017-05-06 DIAGNOSIS — Z794 Long term (current) use of insulin: Secondary | ICD-10-CM | POA: Insufficient documentation

## 2017-05-06 NOTE — Progress Notes (Signed)
  Medical Nutrition Therapy:  Appt start time: 1130 end time:  1215  Assessment:  Primary concerns today: Diabetes Type 2. He is here with his son. Sees. Dr. Dorris Fetch, Endocrinology. Appt was at 130 but due to transportation issues, had to come early. Limited visit today. He admits to enjoying sweets at time. Eats 2-3 meals per day. Sometimes skips lunch if not hungry. Walking a little for exercise. Limited mobility. Walks with a cane for balance. Meds: Lantus 18 units a day; injecting in the arm and requested to change to abdomen area for better absorption. He is testing BS twice a day.  (th grade education. LImited vision-wears glasses. FBS 100-116 now that he is only on 18 units a day. BS at night 91-130 mg/dl. Goal is to prevent hypoglycemia.  He is engaged to eat balanced meals and improve BS.  Lab Results  Component Value Date   HGBA1C 8 03/30/2017      Preferred Learning Style:   No preference indicated   Learning Readiness:   Ready  Change in progress   MEDICATIONS: See list   DIETARY INTAKE:   Eats three meals per day and occassional snack. Likes sweets or desserts at times.   Usual physical activity: walking a little.  Estimated energy needs: 1800  calories 200  g carbohydrates 135 g protein 50 g fat  Progress Towards Goal(s):  In progress.   Nutritional Diagnosis:  NB-1.1 Food and nutrition-related knowledge deficit As related to Diabetes.  As evidenced by A1C 8%.    Intervention:  Nutrition and Diabetes education provided on My Plate, CHO counting, meal planning, portion sizes, timing of meals, avoiding snacks between meals unless having a low blood sugar, target ranges for A1C and blood sugars, signs/symptoms and treatment of hyper/hypoglycemia, monitoring blood sugars, taking medications as prescribed, benefits of exercising 30 minutes per day and prevention of complications of DM.   Marland KitchenGoals Eat three meals per day as discussed Follow the Plate Method DO not  skip meals  Avoid cakes, candy , sweets, and salty and processed meats. Drink 5 bottles of water per day IF BS is less than 70 twice in a day, call Dr. Dorris Fetch Walk a few minutes a day Prevent low blood sugars.  Teaching Method Utilized:  Visual Auditory Hands on  Handouts given during visit include:  The Plate Method  Meal Plan Card   Barriers to learning/adherence to lifestyle change: none  Demonstrated degree of understanding via:  Teach Back   Monitoring/Evaluation:  Dietary intake, exercise, meal planning, and body weight in 1 month(s).

## 2017-05-07 NOTE — Patient Instructions (Signed)
Goals Eat three meals per day as discussed Follow the Plate Method DO not skip meals  Avoid cakes, candy , sweets, and salty and processed meats. Drink 5 bottles of water per day IF BS is less than 70 twice in a day, call Dr. Dorris Fetch Walk a few minutes a day Prevent low blood sugars.

## 2017-05-19 DIAGNOSIS — Z794 Long term (current) use of insulin: Secondary | ICD-10-CM | POA: Diagnosis not present

## 2017-05-19 DIAGNOSIS — Z8673 Personal history of transient ischemic attack (TIA), and cerebral infarction without residual deficits: Secondary | ICD-10-CM | POA: Diagnosis not present

## 2017-05-19 DIAGNOSIS — Z8639 Personal history of other endocrine, nutritional and metabolic disease: Secondary | ICD-10-CM | POA: Diagnosis not present

## 2017-05-19 DIAGNOSIS — E119 Type 2 diabetes mellitus without complications: Secondary | ICD-10-CM | POA: Diagnosis not present

## 2017-05-19 DIAGNOSIS — I509 Heart failure, unspecified: Secondary | ICD-10-CM | POA: Diagnosis not present

## 2017-05-19 DIAGNOSIS — R194 Change in bowel habit: Secondary | ICD-10-CM | POA: Diagnosis not present

## 2017-05-19 DIAGNOSIS — E039 Hypothyroidism, unspecified: Secondary | ICD-10-CM | POA: Diagnosis not present

## 2017-05-19 DIAGNOSIS — Z8249 Family history of ischemic heart disease and other diseases of the circulatory system: Secondary | ICD-10-CM | POA: Diagnosis not present

## 2017-05-19 DIAGNOSIS — I110 Hypertensive heart disease with heart failure: Secondary | ICD-10-CM | POA: Diagnosis not present

## 2017-05-19 DIAGNOSIS — K5900 Constipation, unspecified: Secondary | ICD-10-CM | POA: Diagnosis not present

## 2017-05-19 DIAGNOSIS — K219 Gastro-esophageal reflux disease without esophagitis: Secondary | ICD-10-CM | POA: Diagnosis not present

## 2017-06-09 ENCOUNTER — Encounter: Payer: Medicare HMO | Attending: Family Medicine | Admitting: Nutrition

## 2017-06-09 VITALS — Wt 266.0 lb

## 2017-06-09 DIAGNOSIS — E118 Type 2 diabetes mellitus with unspecified complications: Secondary | ICD-10-CM

## 2017-06-09 DIAGNOSIS — N183 Chronic kidney disease, stage 3 (moderate): Secondary | ICD-10-CM | POA: Insufficient documentation

## 2017-06-09 DIAGNOSIS — Z713 Dietary counseling and surveillance: Secondary | ICD-10-CM | POA: Insufficient documentation

## 2017-06-09 DIAGNOSIS — E1165 Type 2 diabetes mellitus with hyperglycemia: Secondary | ICD-10-CM

## 2017-06-09 DIAGNOSIS — IMO0002 Reserved for concepts with insufficient information to code with codable children: Secondary | ICD-10-CM

## 2017-06-09 DIAGNOSIS — Z794 Long term (current) use of insulin: Secondary | ICD-10-CM | POA: Diagnosis not present

## 2017-06-09 DIAGNOSIS — E1122 Type 2 diabetes mellitus with diabetic chronic kidney disease: Secondary | ICD-10-CM | POA: Insufficient documentation

## 2017-06-09 DIAGNOSIS — E669 Obesity, unspecified: Secondary | ICD-10-CM

## 2017-06-09 NOTE — Progress Notes (Signed)
  Medical Nutrition Therapy:  Appt start time: 1000 end time:  1030   Assessment:  Primary concerns today: Diabetes Type 2. He is here with his son. Sees. Dr. Dorris Fetch, Endocrinology.   Taking 18 units of Lantus at night.   BS are much better.  66-171 mg/dl. Only 1 low blood sugar less than 70. Avg BS about 166 mg/dl. He has gotten confused about writing his numbers down and so his paperwork shows a lot of BS numbers that are not recorded in his machine. I did review back on his machine to assess his BS's. They appear much better than previous visits. None over 200. He has cut out his sweets and his sons are helping him do better with his meals and avoiding sweets.  Lab Results  Component Value Date   HGBA1C 8 03/30/2017     Preferred Learning Style:   No preference indicated   Learning Readiness:   Ready  Change in progress   MEDICATIONS: See list   DIETARY INTAKE:   Eats three meals per day and occassional snack. Likes sweets or desserts at times.   Usual physical activity: walking a little.  Estimated energy needs: 1800  calories 200  g carbohydrates 135 g protein 50 g fat  Progress Towards Goal(s):  In progress.   Nutritional Diagnosis:  NB-1.1 Food and nutrition-related knowledge deficit As related to Diabetes.  As evidenced by A1C 8%.    Intervention:  Nutrition and Diabetes education provided on My Plate, CHO counting, meal planning, portion sizes, timing of meals, avoiding snacks between meals unless having a low blood sugar, target ranges for A1C and blood sugars, signs/symptoms and treatment of hyper/hypoglycemia, monitoring blood sugars, taking medications as prescribed, benefits of exercising 30 minutes per day and prevention of complications of DM.   Goals Keep up the great job!! Eat three meals a day at times discussed. Avoid snacks between meals unless blood sugars is less than 80 mg/dl. Take 18 units of Lantus at night about 9 pm. Keep drinking  water Avoid sweets. Prevent blood sugars less than 80 mg/dl. Call Dr. Dorris Fetch if BS are below 80's or over 300.Marland Kitchen [place]    Teaching Method Utilized:  Visual Auditory Hands on  Handouts given during visit include:  The Plate Method  Meal Plan Card   Barriers to learning/adherence to lifestyle change: none  Demonstrated degree of understanding via:  Teach Back   Monitoring/Evaluation:  Dietary intake, exercise, meal planning, and body weight in 3 month(s).

## 2017-06-16 ENCOUNTER — Encounter: Payer: Self-pay | Admitting: Nutrition

## 2017-06-16 NOTE — Patient Instructions (Signed)
Goals Keep up the great job!! Eat three meals a day at times discussed. Avoid snacks between meals unless blood sugars is less than 80 mg/dl. Take 18 units of Lantus at night about 9 pm. Keep drinking water Avoid sweets. Prevent blood sugars less than 80 mg/dl. Call Dr. Dorris Fetch if BS are below 80's or over 300.Marland Kitchen [phone]

## 2017-06-26 DIAGNOSIS — E876 Hypokalemia: Secondary | ICD-10-CM | POA: Diagnosis not present

## 2017-06-26 DIAGNOSIS — E039 Hypothyroidism, unspecified: Secondary | ICD-10-CM | POA: Diagnosis not present

## 2017-06-26 DIAGNOSIS — E782 Mixed hyperlipidemia: Secondary | ICD-10-CM | POA: Diagnosis not present

## 2017-06-26 DIAGNOSIS — E1165 Type 2 diabetes mellitus with hyperglycemia: Secondary | ICD-10-CM | POA: Diagnosis not present

## 2017-06-26 DIAGNOSIS — D509 Iron deficiency anemia, unspecified: Secondary | ICD-10-CM | POA: Diagnosis not present

## 2017-06-26 DIAGNOSIS — E559 Vitamin D deficiency, unspecified: Secondary | ICD-10-CM | POA: Diagnosis not present

## 2017-06-26 DIAGNOSIS — E871 Hypo-osmolality and hyponatremia: Secondary | ICD-10-CM | POA: Diagnosis not present

## 2017-06-26 DIAGNOSIS — E1122 Type 2 diabetes mellitus with diabetic chronic kidney disease: Secondary | ICD-10-CM | POA: Diagnosis not present

## 2017-06-26 LAB — BASIC METABOLIC PANEL
BUN: 25 — AB (ref 4–21)
CREATININE: 1.7 — AB (ref <=1.3)

## 2017-06-26 LAB — TSH: TSH: 0.2 — AB (ref <=5.90)

## 2017-06-26 LAB — HEMOGLOBIN A1C: HEMOGLOBIN A1C: 7.6

## 2017-07-02 DIAGNOSIS — Z6835 Body mass index (BMI) 35.0-35.9, adult: Secondary | ICD-10-CM | POA: Diagnosis not present

## 2017-07-02 DIAGNOSIS — D509 Iron deficiency anemia, unspecified: Secondary | ICD-10-CM | POA: Diagnosis not present

## 2017-07-02 DIAGNOSIS — M1000 Idiopathic gout, unspecified site: Secondary | ICD-10-CM | POA: Diagnosis not present

## 2017-07-02 DIAGNOSIS — E1122 Type 2 diabetes mellitus with diabetic chronic kidney disease: Secondary | ICD-10-CM | POA: Diagnosis not present

## 2017-07-02 DIAGNOSIS — E782 Mixed hyperlipidemia: Secondary | ICD-10-CM | POA: Diagnosis not present

## 2017-07-02 DIAGNOSIS — E1165 Type 2 diabetes mellitus with hyperglycemia: Secondary | ICD-10-CM | POA: Diagnosis not present

## 2017-07-02 DIAGNOSIS — I10 Essential (primary) hypertension: Secondary | ICD-10-CM | POA: Diagnosis not present

## 2017-07-02 DIAGNOSIS — G4733 Obstructive sleep apnea (adult) (pediatric): Secondary | ICD-10-CM | POA: Diagnosis not present

## 2017-07-13 ENCOUNTER — Ambulatory Visit: Payer: Self-pay | Admitting: Nutrition

## 2017-07-13 ENCOUNTER — Encounter: Payer: Self-pay | Admitting: "Endocrinology

## 2017-07-13 ENCOUNTER — Ambulatory Visit (INDEPENDENT_AMBULATORY_CARE_PROVIDER_SITE_OTHER): Payer: Medicare HMO | Admitting: "Endocrinology

## 2017-07-13 VITALS — BP 141/80 | HR 58 | Ht 74.0 in | Wt 261.0 lb

## 2017-07-13 DIAGNOSIS — I10 Essential (primary) hypertension: Secondary | ICD-10-CM | POA: Diagnosis not present

## 2017-07-13 DIAGNOSIS — E782 Mixed hyperlipidemia: Secondary | ICD-10-CM

## 2017-07-13 DIAGNOSIS — Z794 Long term (current) use of insulin: Secondary | ICD-10-CM | POA: Diagnosis not present

## 2017-07-13 DIAGNOSIS — N183 Chronic kidney disease, stage 3 (moderate): Secondary | ICD-10-CM

## 2017-07-13 DIAGNOSIS — E039 Hypothyroidism, unspecified: Secondary | ICD-10-CM | POA: Diagnosis not present

## 2017-07-13 DIAGNOSIS — Z6833 Body mass index (BMI) 33.0-33.9, adult: Secondary | ICD-10-CM

## 2017-07-13 DIAGNOSIS — E6609 Other obesity due to excess calories: Secondary | ICD-10-CM

## 2017-07-13 DIAGNOSIS — E1122 Type 2 diabetes mellitus with diabetic chronic kidney disease: Secondary | ICD-10-CM

## 2017-07-13 NOTE — Patient Instructions (Signed)

## 2017-07-13 NOTE — Progress Notes (Signed)
Subjective:    Patient ID: Paul Wood., male    DOB: 08/27/1931.  he is being seen in consultation for management of currently uncontrolled symptomatic diabetes requested by  [name], MD.   Past Medical History:  Diagnosis Date  . Coronary atherosclerosis of native coronary artery    4/12 - DES mid RCA, DES distal LAD, DES x2 mid LAD  . Depression   . Essential hypertension, benign   . Gout   . Hearing loss   . Hypertrophy of prostate without urinary obstruction and other lower urinary tract symptoms (LUTS)   . Hypothyroidism   . Incarcerated umbilical hernia   . Mixed hyperlipidemia   . Proteinuria    Mild  . PUD (peptic ulcer disease)   . Stroke [facility])    2008; slight st memory deficirs since last stroke  . Type 2 diabetes mellitus ([place])   . Ventral hernia    Past Surgical History:  Procedure Laterality Date  . CATARACT EXTRACTION W/PHACO Right 08/15/2014   Procedure: CATARACT EXTRACTION PHACO AND INTRAOCULAR LENS PLACEMENT (IOC);  Surgeon: Elta Guadeloupe T. Gershon Crane, MD;  Location: [facility];  Service: Ophthalmology;  Laterality: Right;  CDE:10.77  . HERNIA REPAIR  01/04/14   Dr. Aaron Edelman [place];umbilical  . MULTIPLE TOOTH EXTRACTIONS     HX DENTAL CARIES/ALMOST ALL REMOVED   Social History   Socioeconomic History  . Marital status: Married    Spouse name: None  . Number of children: 4  . Years of education: 9th  . Highest education level: None  Social Needs  . Financial resource strain: None  . Food insecurity - worry: None  . Food insecurity - inability: None  . Transportation needs - medical: None  . Transportation needs - non-medical: None  Occupational History  . Occupation: Retired  Tobacco Use  . Smoking status: Never Smoker  . Smokeless tobacco: Never Used  Substance and Sexual Activity  . Alcohol use: No    Alcohol/week: 0.0 oz  . Drug use: No  . Sexual activity: Yes    Birth control/protection: None  Other Topics Concern  . 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   Outpatient Encounter Medications as of 82/01/2018  Medication Sig  . allopurinol (ZYLOPRIM) 300 MG tablet Take 300 mg by mouth daily.  Marland Kitchen amLODipine (NORVASC) 5 MG tablet Take 1 tablet (5 mg total) by mouth daily.  Marland Kitchen aspirin EC 81 MG tablet Take 81 mg by mouth daily.  Marland Kitchen atorvastatin (LIPITOR) 80 MG tablet TAKE 1 TABLET BY MOUTH AT BEDTIME ( PT NEEDS OFFICE VISIT)  . carvedilol (COREG) 12.5 MG tablet TAKE 1 TABLET BY MOUTH TWICE DAILY  . clopidogrel (PLAVIX) 75 MG tablet TAKE 1 TABLET BY MOUTH EVERY DAY (PATIENT NEEDS OFFICE VISIT)  . doxazosin (CARDURA) 2 MG tablet Take 2 mg by mouth daily.  . ferrous sulfate 325 (65 FE) MG tablet Take 325 mg by mouth daily with breakfast.  . furosemide (LASIX) 20 MG tablet Take 20 mg by mouth 2 (two) times daily.  . Insulin Glargine (LANTUS SOLOSTAR) 100 UNIT/ML Solostar Pen Inject 18 Units into the skin at bedtime.  Marland Kitchen levothyroxine (SYNTHROID, LEVOTHROID) 100 MCG tablet Take 1 tablet (100 mcg total) by mouth daily before breakfast.  . lisinopril (PRINIVIL,ZESTRIL) 20 MG tablet TAKE 1 TABLET BY MOUTH EVERY DAY (PT NEEDS OFFICE VISIT)  . meclizine (ANTIVERT) 25 MG tablet Take 25  mg by mouth 3 (three) times daily as needed for dizziness.  . pantoprazole (PROTONIX) 40 MG tablet Take 40 mg by mouth daily.   . tamsulosin (FLOMAX) 0.4 MG CAPS capsule Take 0.4 mg by mouth daily.  . Vitamin D, Ergocalciferol, (DRISDOL) 50000 units CAPS capsule Take 50,000 Units by mouth every 7 (seven) days.   No facility-administered encounter medications on file as of 07/13/2017.     ALLERGIES: No Known Allergies  VACCINATION STATUS: Immunization History  Administered Date(s) Administered  . Influenza,inj,Quad PF,6+ Mos 05/07/2015    Diabetes  He presents for his follow-up diabetic visit. He has type 2 diabetes mellitus. Onset time: She was diagnosed at approximate age of  84 years. His disease course has been improving. There are no hypoglycemic associated symptoms. Pertinent negatives for hypoglycemia include no confusion, headaches, pallor or seizures. Associated symptoms include blurred vision and foot paresthesias. Pertinent negatives for diabetes include no chest pain, no fatigue, no polydipsia, no polyphagia, no polyuria and no weakness. There are no hypoglycemic complications. Symptoms are improving. Diabetic complications include a CVA, nephropathy, peripheral neuropathy and retinopathy. Risk factors for coronary artery disease include dyslipidemia, diabetes mellitus, hypertension, male sex, obesity and sedentary lifestyle. Current diabetic treatment includes insulin injections. His weight is decreasing steadily. He is following a generally unhealthy diet. When asked about meal planning, he reported none. He has had a previous visit with a dietitian. He never participates in exercise. His breakfast blood glucose range is generally 130-140 mg/dl. His bedtime blood glucose range is generally 180-200 mg/dl. His overall blood glucose range is 180-200 mg/dl. Eye exam is current.  Hyperlipidemia  This is a chronic problem. The current episode started more than 1 year ago. The problem is controlled. Exacerbating diseases include diabetes, hypothyroidism and obesity. Pertinent negatives include no chest pain, myalgias or shortness of breath. Current antihyperlipidemic treatment includes statins. Risk factors for coronary artery disease include diabetes mellitus, dyslipidemia, hypertension, male sex, obesity and a sedentary lifestyle.  Hypertension  This is a chronic problem. The current episode started more than 1 year ago. The problem is controlled. Associated symptoms include blurred vision. Pertinent negatives include no chest pain, headaches, neck pain, palpitations or shortness of breath. Risk factors for coronary artery disease include dyslipidemia, diabetes mellitus, male  gender, obesity and sedentary lifestyle. Hypertensive end-organ damage includes CVA and retinopathy.     Review of Systems  Constitutional: Negative for chills, fatigue, fever and unexpected weight change.  HENT: Negative for dental problem, mouth sores and trouble swallowing.   Eyes: Positive for blurred vision. Negative for visual disturbance.  Respiratory: Negative for cough, choking, chest tightness, shortness of breath and wheezing.   Cardiovascular: Negative for chest pain, palpitations and leg swelling.  Gastrointestinal: Negative for abdominal distention, abdominal pain, constipation, diarrhea, nausea and vomiting.  Endocrine: Negative for polydipsia, polyphagia and polyuria.  Genitourinary: Negative for dysuria, flank pain, hematuria and urgency.  Musculoskeletal: Positive for gait problem. Negative for back pain, myalgias and neck pain.       He has history of CVA with no apparent residual must weakness , he walks with a cane.  Skin: Negative for pallor, rash and wound.  Neurological: Negative for seizures, syncope, weakness, numbness and headaches.  Psychiatric/Behavioral: Negative.  Negative for confusion and dysphoric mood.    Objective:    BP (!) 141/80   Pulse (!) 58   Ht 6\' 2"  (1.88 m)   Wt 261 lb (118.4 kg)   BMI 33.51 kg/m   Wt  Readings from Last 3 Encounters:  07/13/17 261 lb (118.4 kg)  06/09/17 266 lb (120.7 kg)  05/07/17 265 lb (120.2 kg)     Physical Exam  Constitutional: He is oriented to person, place, and time. He appears well-developed and well-nourished. He is cooperative. No distress.  HENT:  Head: Normocephalic and atraumatic.  Eyes: EOM are normal.  Neck: Normal range of motion. Neck supple. No tracheal deviation present. No thyromegaly present.  Cardiovascular: Normal rate, S1 normal, S2 normal and normal heart sounds. Exam reveals no gallop.  No murmur heard. Pulses:      Dorsalis pedis pulses are 1+ on the right side, and 1+ on the left  side.       Posterior tibial pulses are 1+ on the right side, and 1+ on the left side.  Pulmonary/Chest: Breath sounds normal. No respiratory distress. He has no wheezes.  Abdominal: Soft. Bowel sounds are normal. He exhibits no distension. There is no tenderness. There is no guarding and no CVA tenderness.  Musculoskeletal: He exhibits edema.       Right shoulder: He exhibits no swelling and no deformity.  Neurological: He is alert and oriented to person, place, and time. He has normal strength and normal reflexes. No cranial nerve deficit or sensory deficit. Gait normal.  Skin: Skin is warm and dry. No rash noted. No cyanosis. Nails show no clubbing.  Psychiatric: He has a normal mood and affect. His speech is normal. Cognition and memory are normal.    CMP ( most recent) CMP     Component Value Date/Time   NA 139 05/18/2015 0603   K 4.5 05/18/2015 0603   CL 111 05/18/2015 0603   CO2 23 05/18/2015 0603   GLUCOSE 106 (H) 05/18/2015 0603   BUN 25 (A) 06/26/2017   CREATININE 1.7 (A) 06/26/2017   CREATININE 1.46 (H) 05/18/2015 0603   CALCIUM 8.2 (L) 05/18/2015 0603   PROT 6.6 05/16/2015 1706   ALBUMIN 3.7 05/16/2015 1706   AST 20 05/16/2015 1706   ALT 12 (L) 05/16/2015 1706   ALKPHOS 66 05/16/2015 1706   BILITOT 0.3 05/16/2015 1706   GFRNONAA 43 (L) 05/18/2015 0603   GFRAA 50 (L) 05/18/2015 0603     Diabetic Labs (most recent): Lab Results  Component Value Date   HGBA1C 7.6 06/26/2017   HGBA1C 8 03/30/2017   HGBA1C 7.0 (H) 05/17/2015     Lipid Panel ( most recent) Lipid Panel     Component Value Date/Time   CHOL 141 05/17/2015 0536   TRIG 48 05/17/2015 0536   HDL 58 05/17/2015 0536   CHOLHDL 2.4 05/17/2015 0536   VLDL 10 05/17/2015 0536   LDLCALC 73 05/17/2015 0536      Assessment & Plan:   1. Type 2 diabetes mellitus with stage 3 chronic kidney disease, CVA with long-term current use of insulin ([place])  - Patient has currently uncontrolled symptomatic type 2  DM since  84 years of age. - Recent labs show A1c improving to 7.6%. -his diabetes is complicated by stage 3-4 renal insufficiency, cerebrovascular accident, retinopathy, obesity/sedentary life and [place]. remains at a high risk for more acute and chronic complications which include CAD, CVA, CKD, retinopathy, and neuropathy. These are all discussed in detail with the patient.  - I have counseled him on diet management and weight loss, by adopting a carbohydrate restricted/protein rich diet.  -  Suggestion is made for him to avoid simple carbohydrates  from his diet including Cakes,  Sweet Desserts / Pastries, Ice Cream, Soda (diet and regular), Sweet Tea, Candies, Chips, Cookies, Store Bought Juices, Alcohol in Excess of  1-2 drinks a day, Artificial Sweeteners, and "Sugar-free" Products. This will help patient to have stable blood glucose profile and potentially avoid unintended weight gain.   - I encouraged him to switch to  unprocessed or minimally processed complex starch and increased protein intake (animal or plant source), fruits, and vegetables.  - he is advised to stick to a routine mealtimes to eat 3 meals  a day and avoid unnecessary snacks ( to snack only to correct hypoglycemia).   - I have approached him with the following individualized plan to manage diabetes and patient agrees:   -  The patient has minimal social support. The #1 goal in his care would be to avoid hypoglycemia. - Target A1c for him will be between 7.5-8%, which he is very close to,  with A1c of 7.6%. - I  will proceed to lower his Lantus to 18 units daily at bedtime , associated with strict monitoring of glucose  at least 2 times a day-daily before breakfast and at bedtime.   - Patient is warned not to take insulin without proper monitoring per orders.  -Patient is encouraged to call clinic for blood glucose levels less than 70 or above 300 mg /dl. -Patient is not a candidate for metformin, SGLT2  inhibitors due to CKD. - he will be considered for incretin therapy as appropriate next visit. - Patient specific target  A1c;  LDL, HDL, Triglycerides, and  Waist Circumference were discussed in detail.  2) BP/HTN:  Uncontrolled but close to target, I advised him to continue his current blood pressure medications. No changes necessary today. 3) Lipids/HPL:   controlled with LDL 73.   Patient is advised to continue statins. 4)  Weight/Diet: CDE Consult will be initiated , exercise, and detailed carbohydrates information provided.  5) hypothyroidism: -His repeat labs show better TSH of 0.2, free T4 within target at 1.58.  - Suspicious for secondary hypothyroidism.  - I advised him to continue his current dose of levothyroxine 100 g by mouth every morning.   - We discussed about correct intake of levothyroxine, at fasting, with water, separated by at least 30 minutes from breakfast, and separated by more than 4 hours from calcium, iron, multivitamins, acid reflux medications (PPIs). -Patient is made aware of the fact that thyroid hormone replacement is needed for life, dose to be adjusted by periodic monitoring of thyroid function tests. 6) Chronic Care/Health Maintenance:  -he  is on Statin medications and  is encouraged to continue to follow up with Ophthalmology, Dentist,  Podiatrist at least yearly or according to recommendations, and advised to   stay away from smoking. I have recommended yearly flu vaccine and pneumonia vaccination at least every 5 years; moderate intensity exercise for up to 150 minutes weekly; and  sleep for at least 7 hours a day.  - Time spent with the patient: 25 min, of which >50% was spent in reviewing his blood glucose logs , discussing his hypo- and hyper-glycemic episodes, reviewing his current and  previous labs and insulin doses and developing a plan to avoid hypo- and hyper-glycemia. Please refer to Patient Instructions for Blood Glucose Monitoring and  Insulin/Medications Dosing Guide"  in media tab for additional information.  - I advised patient to maintain close follow up with Burdine, Virgina Evener, MD for primary care needs.  Follow up plan: - Return in about  4 months (around 11/10/2017) for follow up with pre-visit labs, meter, and logs.  Glade Lloyd, MD Phone: [phone]  Fax: [phone]   07/13/2017, 9:29 AM This note was partially dictated with voice recognition software. Similar sounding words can be transcribed inadequately or may not  be corrected upon review.

## 2017-09-04 DIAGNOSIS — E1122 Type 2 diabetes mellitus with diabetic chronic kidney disease: Secondary | ICD-10-CM | POA: Diagnosis not present

## 2017-09-04 DIAGNOSIS — D509 Iron deficiency anemia, unspecified: Secondary | ICD-10-CM | POA: Diagnosis not present

## 2017-09-04 DIAGNOSIS — E782 Mixed hyperlipidemia: Secondary | ICD-10-CM | POA: Diagnosis not present

## 2017-09-04 DIAGNOSIS — E1165 Type 2 diabetes mellitus with hyperglycemia: Secondary | ICD-10-CM | POA: Diagnosis not present

## 2017-09-04 DIAGNOSIS — I10 Essential (primary) hypertension: Secondary | ICD-10-CM | POA: Diagnosis not present

## 2017-09-04 DIAGNOSIS — N184 Chronic kidney disease, stage 4 (severe): Secondary | ICD-10-CM | POA: Diagnosis not present

## 2017-09-04 DIAGNOSIS — E039 Hypothyroidism, unspecified: Secondary | ICD-10-CM | POA: Diagnosis not present

## 2017-09-04 DIAGNOSIS — E871 Hypo-osmolality and hyponatremia: Secondary | ICD-10-CM | POA: Diagnosis not present

## 2017-09-09 DIAGNOSIS — Z23 Encounter for immunization: Secondary | ICD-10-CM | POA: Diagnosis not present

## 2017-09-09 DIAGNOSIS — I10 Essential (primary) hypertension: Secondary | ICD-10-CM | POA: Diagnosis not present

## 2017-09-09 DIAGNOSIS — E1122 Type 2 diabetes mellitus with diabetic chronic kidney disease: Secondary | ICD-10-CM | POA: Diagnosis not present

## 2017-09-09 DIAGNOSIS — I110 Hypertensive heart disease with heart failure: Secondary | ICD-10-CM | POA: Diagnosis not present

## 2017-09-09 DIAGNOSIS — E876 Hypokalemia: Secondary | ICD-10-CM | POA: Diagnosis not present

## 2017-09-09 DIAGNOSIS — Z6836 Body mass index (BMI) 36.0-36.9, adult: Secondary | ICD-10-CM | POA: Diagnosis not present

## 2017-09-09 DIAGNOSIS — N184 Chronic kidney disease, stage 4 (severe): Secondary | ICD-10-CM | POA: Diagnosis not present

## 2017-09-15 DIAGNOSIS — I739 Peripheral vascular disease, unspecified: Secondary | ICD-10-CM | POA: Diagnosis not present

## 2017-11-10 ENCOUNTER — Ambulatory Visit: Payer: Medicare HMO | Admitting: "Endocrinology

## 2017-12-02 DIAGNOSIS — E1122 Type 2 diabetes mellitus with diabetic chronic kidney disease: Secondary | ICD-10-CM | POA: Diagnosis not present

## 2017-12-02 DIAGNOSIS — Z794 Long term (current) use of insulin: Secondary | ICD-10-CM | POA: Diagnosis not present

## 2017-12-02 DIAGNOSIS — I5032 Chronic diastolic (congestive) heart failure: Secondary | ICD-10-CM | POA: Diagnosis not present

## 2017-12-02 DIAGNOSIS — E782 Mixed hyperlipidemia: Secondary | ICD-10-CM | POA: Diagnosis not present

## 2017-12-02 DIAGNOSIS — E871 Hypo-osmolality and hyponatremia: Secondary | ICD-10-CM | POA: Diagnosis not present

## 2017-12-02 DIAGNOSIS — E876 Hypokalemia: Secondary | ICD-10-CM | POA: Diagnosis not present

## 2017-12-02 DIAGNOSIS — I10 Essential (primary) hypertension: Secondary | ICD-10-CM | POA: Diagnosis not present

## 2017-12-02 DIAGNOSIS — E039 Hypothyroidism, unspecified: Secondary | ICD-10-CM | POA: Diagnosis not present

## 2017-12-02 DIAGNOSIS — N184 Chronic kidney disease, stage 4 (severe): Secondary | ICD-10-CM | POA: Diagnosis not present

## 2017-12-07 DIAGNOSIS — N184 Chronic kidney disease, stage 4 (severe): Secondary | ICD-10-CM | POA: Diagnosis not present

## 2017-12-07 DIAGNOSIS — Z1389 Encounter for screening for other disorder: Secondary | ICD-10-CM | POA: Diagnosis not present

## 2017-12-07 DIAGNOSIS — E1122 Type 2 diabetes mellitus with diabetic chronic kidney disease: Secondary | ICD-10-CM | POA: Diagnosis not present

## 2017-12-07 DIAGNOSIS — Z1331 Encounter for screening for depression: Secondary | ICD-10-CM | POA: Diagnosis not present

## 2017-12-07 DIAGNOSIS — Z6836 Body mass index (BMI) 36.0-36.9, adult: Secondary | ICD-10-CM | POA: Diagnosis not present

## 2017-12-07 DIAGNOSIS — I110 Hypertensive heart disease with heart failure: Secondary | ICD-10-CM | POA: Diagnosis not present

## 2017-12-07 DIAGNOSIS — I10 Essential (primary) hypertension: Secondary | ICD-10-CM | POA: Diagnosis not present

## 2017-12-16 DIAGNOSIS — E782 Mixed hyperlipidemia: Secondary | ICD-10-CM | POA: Diagnosis not present

## 2017-12-16 DIAGNOSIS — I10 Essential (primary) hypertension: Secondary | ICD-10-CM | POA: Diagnosis not present

## 2017-12-16 DIAGNOSIS — N183 Chronic kidney disease, stage 3 (moderate): Secondary | ICD-10-CM | POA: Diagnosis not present

## 2018-01-13 DIAGNOSIS — M79674 Pain in right toe(s): Secondary | ICD-10-CM | POA: Diagnosis not present

## 2018-01-13 DIAGNOSIS — M79672 Pain in left foot: Secondary | ICD-10-CM | POA: Diagnosis not present

## 2018-01-13 DIAGNOSIS — E1151 Type 2 diabetes mellitus with diabetic peripheral angiopathy without gangrene: Secondary | ICD-10-CM | POA: Diagnosis not present

## 2018-01-13 DIAGNOSIS — M79671 Pain in right foot: Secondary | ICD-10-CM | POA: Diagnosis not present

## 2018-01-15 DIAGNOSIS — E1122 Type 2 diabetes mellitus with diabetic chronic kidney disease: Secondary | ICD-10-CM | POA: Diagnosis not present

## 2018-01-15 DIAGNOSIS — K259 Gastric ulcer, unspecified as acute or chronic, without hemorrhage or perforation: Secondary | ICD-10-CM | POA: Diagnosis not present

## 2018-01-15 DIAGNOSIS — E1165 Type 2 diabetes mellitus with hyperglycemia: Secondary | ICD-10-CM | POA: Diagnosis not present

## 2018-01-15 DIAGNOSIS — E782 Mixed hyperlipidemia: Secondary | ICD-10-CM | POA: Diagnosis not present

## 2018-01-15 DIAGNOSIS — N184 Chronic kidney disease, stage 4 (severe): Secondary | ICD-10-CM | POA: Diagnosis not present

## 2018-01-15 DIAGNOSIS — E871 Hypo-osmolality and hyponatremia: Secondary | ICD-10-CM | POA: Diagnosis not present

## 2018-01-15 DIAGNOSIS — E039 Hypothyroidism, unspecified: Secondary | ICD-10-CM | POA: Diagnosis not present

## 2018-01-15 DIAGNOSIS — I10 Essential (primary) hypertension: Secondary | ICD-10-CM | POA: Diagnosis not present

## 2018-01-15 DIAGNOSIS — D509 Iron deficiency anemia, unspecified: Secondary | ICD-10-CM | POA: Diagnosis not present

## 2018-02-15 DIAGNOSIS — E875 Hyperkalemia: Secondary | ICD-10-CM | POA: Diagnosis not present

## 2018-02-15 DIAGNOSIS — I10 Essential (primary) hypertension: Secondary | ICD-10-CM | POA: Diagnosis not present

## 2018-02-15 DIAGNOSIS — N184 Chronic kidney disease, stage 4 (severe): Secondary | ICD-10-CM | POA: Diagnosis not present

## 2018-02-15 DIAGNOSIS — R809 Proteinuria, unspecified: Secondary | ICD-10-CM | POA: Diagnosis not present

## 2018-02-25 DIAGNOSIS — R69 Illness, unspecified: Secondary | ICD-10-CM | POA: Diagnosis not present

## 2018-03-01 ENCOUNTER — Other Ambulatory Visit (HOSPITAL_COMMUNITY): Payer: Self-pay | Admitting: Nephrology

## 2018-03-01 DIAGNOSIS — N183 Chronic kidney disease, stage 3 unspecified: Secondary | ICD-10-CM

## 2018-03-01 DIAGNOSIS — I10 Essential (primary) hypertension: Secondary | ICD-10-CM

## 2018-03-01 DIAGNOSIS — I509 Heart failure, unspecified: Secondary | ICD-10-CM

## 2018-03-03 DIAGNOSIS — E039 Hypothyroidism, unspecified: Secondary | ICD-10-CM | POA: Diagnosis not present

## 2018-03-03 DIAGNOSIS — E1122 Type 2 diabetes mellitus with diabetic chronic kidney disease: Secondary | ICD-10-CM | POA: Diagnosis not present

## 2018-03-03 DIAGNOSIS — E1165 Type 2 diabetes mellitus with hyperglycemia: Secondary | ICD-10-CM | POA: Diagnosis not present

## 2018-03-03 DIAGNOSIS — E782 Mixed hyperlipidemia: Secondary | ICD-10-CM | POA: Diagnosis not present

## 2018-03-03 DIAGNOSIS — I10 Essential (primary) hypertension: Secondary | ICD-10-CM | POA: Diagnosis not present

## 2018-03-03 DIAGNOSIS — E871 Hypo-osmolality and hyponatremia: Secondary | ICD-10-CM | POA: Diagnosis not present

## 2018-03-03 DIAGNOSIS — Z794 Long term (current) use of insulin: Secondary | ICD-10-CM | POA: Diagnosis not present

## 2018-03-10 DIAGNOSIS — Z0001 Encounter for general adult medical examination with abnormal findings: Secondary | ICD-10-CM | POA: Diagnosis not present

## 2018-03-10 DIAGNOSIS — I10 Essential (primary) hypertension: Secondary | ICD-10-CM | POA: Diagnosis not present

## 2018-03-10 DIAGNOSIS — E782 Mixed hyperlipidemia: Secondary | ICD-10-CM | POA: Diagnosis not present

## 2018-03-10 DIAGNOSIS — Z6835 Body mass index (BMI) 35.0-35.9, adult: Secondary | ICD-10-CM | POA: Diagnosis not present

## 2018-03-11 ENCOUNTER — Ambulatory Visit (HOSPITAL_BASED_OUTPATIENT_CLINIC_OR_DEPARTMENT_OTHER)
Admission: RE | Admit: 2018-03-11 | Discharge: 2018-03-11 | Disposition: A | Discharge status: Home / Self Care | Payer: Medicare HMO | Source: Ambulatory Visit | Attending: Family Medicine | Admitting: Family Medicine

## 2018-03-11 ENCOUNTER — Ambulatory Visit (HOSPITAL_COMMUNITY)
Admission: RE | Admit: 2018-03-11 | Discharge: 2018-03-11 | Disposition: A | Discharge status: Home / Self Care | Payer: Medicare HMO | Source: Ambulatory Visit | Attending: Nephrology | Admitting: Nephrology

## 2018-03-11 ENCOUNTER — Other Ambulatory Visit (HOSPITAL_COMMUNITY): Payer: Self-pay | Admitting: Nephrology

## 2018-03-11 DIAGNOSIS — Z1159 Encounter for screening for other viral diseases: Secondary | ICD-10-CM | POA: Diagnosis not present

## 2018-03-11 DIAGNOSIS — I509 Heart failure, unspecified: Secondary | ICD-10-CM | POA: Diagnosis not present

## 2018-03-11 DIAGNOSIS — Z6833 Body mass index (BMI) 33.0-33.9, adult: Secondary | ICD-10-CM | POA: Insufficient documentation

## 2018-03-11 DIAGNOSIS — I10 Essential (primary) hypertension: Secondary | ICD-10-CM | POA: Diagnosis not present

## 2018-03-11 DIAGNOSIS — E785 Hyperlipidemia, unspecified: Secondary | ICD-10-CM | POA: Insufficient documentation

## 2018-03-11 DIAGNOSIS — I130 Hypertensive heart and chronic kidney disease with heart failure and stage 1 through stage 4 chronic kidney disease, or unspecified chronic kidney disease: Secondary | ICD-10-CM | POA: Insufficient documentation

## 2018-03-11 DIAGNOSIS — E1122 Type 2 diabetes mellitus with diabetic chronic kidney disease: Secondary | ICD-10-CM | POA: Insufficient documentation

## 2018-03-11 DIAGNOSIS — D509 Iron deficiency anemia, unspecified: Secondary | ICD-10-CM | POA: Diagnosis not present

## 2018-03-11 DIAGNOSIS — N183 Chronic kidney disease, stage 3 unspecified: Secondary | ICD-10-CM

## 2018-03-11 DIAGNOSIS — K219 Gastro-esophageal reflux disease without esophagitis: Secondary | ICD-10-CM | POA: Insufficient documentation

## 2018-03-11 DIAGNOSIS — I071 Rheumatic tricuspid insufficiency: Secondary | ICD-10-CM | POA: Insufficient documentation

## 2018-03-11 DIAGNOSIS — D519 Vitamin B12 deficiency anemia, unspecified: Secondary | ICD-10-CM | POA: Diagnosis not present

## 2018-03-11 DIAGNOSIS — E559 Vitamin D deficiency, unspecified: Secondary | ICD-10-CM | POA: Diagnosis not present

## 2018-03-11 DIAGNOSIS — E669 Obesity, unspecified: Secondary | ICD-10-CM | POA: Insufficient documentation

## 2018-03-11 DIAGNOSIS — Z79899 Other long term (current) drug therapy: Secondary | ICD-10-CM | POA: Diagnosis not present

## 2018-03-11 DIAGNOSIS — R809 Proteinuria, unspecified: Secondary | ICD-10-CM | POA: Diagnosis not present

## 2018-03-11 DIAGNOSIS — N281 Cyst of kidney, acquired: Secondary | ICD-10-CM | POA: Diagnosis not present

## 2018-03-11 NOTE — Progress Notes (Signed)
*  PRELIMINARY RESULTS* Echocardiogram 2D Echocardiogram has been performed.  Paul Wood 03/11/2018, 11:22 AM

## 2018-03-25 DIAGNOSIS — H524 Presbyopia: Secondary | ICD-10-CM | POA: Diagnosis not present

## 2018-03-25 DIAGNOSIS — E119 Type 2 diabetes mellitus without complications: Secondary | ICD-10-CM | POA: Diagnosis not present

## 2018-03-25 DIAGNOSIS — H52203 Unspecified astigmatism, bilateral: Secondary | ICD-10-CM | POA: Diagnosis not present

## 2018-03-25 DIAGNOSIS — H5212 Myopia, left eye: Secondary | ICD-10-CM | POA: Diagnosis not present

## 2018-03-25 DIAGNOSIS — Z794 Long term (current) use of insulin: Secondary | ICD-10-CM | POA: Diagnosis not present

## 2018-03-25 DIAGNOSIS — Z961 Presence of intraocular lens: Secondary | ICD-10-CM | POA: Diagnosis not present

## 2018-03-25 DIAGNOSIS — Z7984 Long term (current) use of oral hypoglycemic drugs: Secondary | ICD-10-CM | POA: Diagnosis not present

## 2018-06-02 DIAGNOSIS — E039 Hypothyroidism, unspecified: Secondary | ICD-10-CM | POA: Diagnosis not present

## 2018-06-02 DIAGNOSIS — N184 Chronic kidney disease, stage 4 (severe): Secondary | ICD-10-CM | POA: Diagnosis not present

## 2018-06-02 DIAGNOSIS — E1140 Type 2 diabetes mellitus with diabetic neuropathy, unspecified: Secondary | ICD-10-CM | POA: Diagnosis not present

## 2018-06-02 DIAGNOSIS — E1122 Type 2 diabetes mellitus with diabetic chronic kidney disease: Secondary | ICD-10-CM | POA: Diagnosis not present

## 2018-06-02 DIAGNOSIS — E871 Hypo-osmolality and hyponatremia: Secondary | ICD-10-CM | POA: Diagnosis not present

## 2018-06-02 DIAGNOSIS — L84 Corns and callosities: Secondary | ICD-10-CM | POA: Diagnosis not present

## 2018-06-02 DIAGNOSIS — I10 Essential (primary) hypertension: Secondary | ICD-10-CM | POA: Diagnosis not present

## 2018-06-02 DIAGNOSIS — E782 Mixed hyperlipidemia: Secondary | ICD-10-CM | POA: Diagnosis not present

## 2018-06-02 DIAGNOSIS — Z6836 Body mass index (BMI) 36.0-36.9, adult: Secondary | ICD-10-CM | POA: Diagnosis not present

## 2018-06-07 DIAGNOSIS — Z6836 Body mass index (BMI) 36.0-36.9, adult: Secondary | ICD-10-CM | POA: Diagnosis not present

## 2018-06-07 DIAGNOSIS — E1122 Type 2 diabetes mellitus with diabetic chronic kidney disease: Secondary | ICD-10-CM | POA: Diagnosis not present

## 2018-06-07 DIAGNOSIS — E1140 Type 2 diabetes mellitus with diabetic neuropathy, unspecified: Secondary | ICD-10-CM | POA: Diagnosis not present

## 2018-06-07 DIAGNOSIS — N184 Chronic kidney disease, stage 4 (severe): Secondary | ICD-10-CM | POA: Diagnosis not present

## 2018-06-07 DIAGNOSIS — I10 Essential (primary) hypertension: Secondary | ICD-10-CM | POA: Diagnosis not present

## 2018-06-07 DIAGNOSIS — E782 Mixed hyperlipidemia: Secondary | ICD-10-CM | POA: Diagnosis not present

## 2018-06-07 DIAGNOSIS — I517 Cardiomegaly: Secondary | ICD-10-CM | POA: Diagnosis not present

## 2018-06-07 DIAGNOSIS — D509 Iron deficiency anemia, unspecified: Secondary | ICD-10-CM | POA: Diagnosis not present

## 2018-06-28 DIAGNOSIS — M2041 Other hammer toe(s) (acquired), right foot: Secondary | ICD-10-CM | POA: Diagnosis not present

## 2018-06-28 DIAGNOSIS — L84 Corns and callosities: Secondary | ICD-10-CM | POA: Diagnosis not present

## 2018-06-28 DIAGNOSIS — E1140 Type 2 diabetes mellitus with diabetic neuropathy, unspecified: Secondary | ICD-10-CM | POA: Diagnosis not present

## 2018-08-10 DIAGNOSIS — R69 Illness, unspecified: Secondary | ICD-10-CM | POA: Diagnosis not present

## 2018-09-08 DIAGNOSIS — E039 Hypothyroidism, unspecified: Secondary | ICD-10-CM | POA: Diagnosis not present

## 2018-09-08 DIAGNOSIS — D509 Iron deficiency anemia, unspecified: Secondary | ICD-10-CM | POA: Diagnosis not present

## 2018-09-08 DIAGNOSIS — I10 Essential (primary) hypertension: Secondary | ICD-10-CM | POA: Diagnosis not present

## 2018-09-08 DIAGNOSIS — E875 Hyperkalemia: Secondary | ICD-10-CM | POA: Diagnosis not present

## 2018-09-08 DIAGNOSIS — E871 Hypo-osmolality and hyponatremia: Secondary | ICD-10-CM | POA: Diagnosis not present

## 2018-09-08 DIAGNOSIS — E1122 Type 2 diabetes mellitus with diabetic chronic kidney disease: Secondary | ICD-10-CM | POA: Diagnosis not present

## 2018-09-08 DIAGNOSIS — N185 Chronic kidney disease, stage 5: Secondary | ICD-10-CM | POA: Diagnosis not present

## 2018-09-13 DIAGNOSIS — J019 Acute sinusitis, unspecified: Secondary | ICD-10-CM | POA: Diagnosis not present

## 2018-09-13 DIAGNOSIS — Z6836 Body mass index (BMI) 36.0-36.9, adult: Secondary | ICD-10-CM | POA: Diagnosis not present

## 2018-09-13 DIAGNOSIS — E1122 Type 2 diabetes mellitus with diabetic chronic kidney disease: Secondary | ICD-10-CM | POA: Diagnosis not present

## 2018-09-13 DIAGNOSIS — I10 Essential (primary) hypertension: Secondary | ICD-10-CM | POA: Diagnosis not present

## 2018-09-13 DIAGNOSIS — E1140 Type 2 diabetes mellitus with diabetic neuropathy, unspecified: Secondary | ICD-10-CM | POA: Diagnosis not present

## 2018-09-13 DIAGNOSIS — I517 Cardiomegaly: Secondary | ICD-10-CM | POA: Diagnosis not present

## 2018-09-13 DIAGNOSIS — E782 Mixed hyperlipidemia: Secondary | ICD-10-CM | POA: Diagnosis not present

## 2018-09-13 DIAGNOSIS — D509 Iron deficiency anemia, unspecified: Secondary | ICD-10-CM | POA: Diagnosis not present

## 2018-11-08 DIAGNOSIS — R69 Illness, unspecified: Secondary | ICD-10-CM | POA: Diagnosis not present

## 2018-11-11 DIAGNOSIS — M79672 Pain in left foot: Secondary | ICD-10-CM | POA: Diagnosis not present

## 2018-11-11 DIAGNOSIS — E1140 Type 2 diabetes mellitus with diabetic neuropathy, unspecified: Secondary | ICD-10-CM | POA: Diagnosis not present

## 2018-11-11 DIAGNOSIS — M79671 Pain in right foot: Secondary | ICD-10-CM | POA: Diagnosis not present

## 2018-11-11 DIAGNOSIS — I739 Peripheral vascular disease, unspecified: Secondary | ICD-10-CM | POA: Diagnosis not present

## 2018-12-10 DIAGNOSIS — E1122 Type 2 diabetes mellitus with diabetic chronic kidney disease: Secondary | ICD-10-CM | POA: Diagnosis not present

## 2018-12-10 DIAGNOSIS — D529 Folate deficiency anemia, unspecified: Secondary | ICD-10-CM | POA: Diagnosis not present

## 2018-12-10 DIAGNOSIS — D519 Vitamin B12 deficiency anemia, unspecified: Secondary | ICD-10-CM | POA: Diagnosis not present

## 2018-12-10 DIAGNOSIS — D649 Anemia, unspecified: Secondary | ICD-10-CM | POA: Diagnosis not present

## 2018-12-10 DIAGNOSIS — E1165 Type 2 diabetes mellitus with hyperglycemia: Secondary | ICD-10-CM | POA: Diagnosis not present

## 2018-12-15 DIAGNOSIS — I10 Essential (primary) hypertension: Secondary | ICD-10-CM | POA: Diagnosis not present

## 2018-12-15 DIAGNOSIS — E875 Hyperkalemia: Secondary | ICD-10-CM | POA: Diagnosis not present

## 2018-12-15 DIAGNOSIS — E1140 Type 2 diabetes mellitus with diabetic neuropathy, unspecified: Secondary | ICD-10-CM | POA: Diagnosis not present

## 2018-12-15 DIAGNOSIS — I517 Cardiomegaly: Secondary | ICD-10-CM | POA: Diagnosis not present

## 2018-12-15 DIAGNOSIS — I5033 Acute on chronic diastolic (congestive) heart failure: Secondary | ICD-10-CM | POA: Diagnosis not present

## 2018-12-15 DIAGNOSIS — E1122 Type 2 diabetes mellitus with diabetic chronic kidney disease: Secondary | ICD-10-CM | POA: Diagnosis not present

## 2018-12-15 DIAGNOSIS — Z6838 Body mass index (BMI) 38.0-38.9, adult: Secondary | ICD-10-CM | POA: Diagnosis not present

## 2018-12-15 DIAGNOSIS — E782 Mixed hyperlipidemia: Secondary | ICD-10-CM | POA: Diagnosis not present

## 2018-12-28 DIAGNOSIS — R600 Localized edema: Secondary | ICD-10-CM | POA: Diagnosis not present

## 2018-12-28 DIAGNOSIS — I5030 Unspecified diastolic (congestive) heart failure: Secondary | ICD-10-CM | POA: Diagnosis not present

## 2018-12-28 DIAGNOSIS — I517 Cardiomegaly: Secondary | ICD-10-CM | POA: Diagnosis not present

## 2018-12-28 DIAGNOSIS — I700 Atherosclerosis of aorta: Secondary | ICD-10-CM | POA: Diagnosis not present

## 2018-12-28 DIAGNOSIS — I358 Other nonrheumatic aortic valve disorders: Secondary | ICD-10-CM | POA: Diagnosis not present

## 2019-01-27 DIAGNOSIS — M79671 Pain in right foot: Secondary | ICD-10-CM | POA: Diagnosis not present

## 2019-01-27 DIAGNOSIS — I739 Peripheral vascular disease, unspecified: Secondary | ICD-10-CM | POA: Diagnosis not present

## 2019-01-27 DIAGNOSIS — E1140 Type 2 diabetes mellitus with diabetic neuropathy, unspecified: Secondary | ICD-10-CM | POA: Diagnosis not present

## 2019-01-27 DIAGNOSIS — M79672 Pain in left foot: Secondary | ICD-10-CM | POA: Diagnosis not present

## 2019-03-30 DIAGNOSIS — R69 Illness, unspecified: Secondary | ICD-10-CM | POA: Diagnosis not present

## 2019-04-06 DIAGNOSIS — I10 Essential (primary) hypertension: Secondary | ICD-10-CM | POA: Diagnosis not present

## 2019-04-06 DIAGNOSIS — E782 Mixed hyperlipidemia: Secondary | ICD-10-CM | POA: Diagnosis not present

## 2019-04-11 DIAGNOSIS — Z6837 Body mass index (BMI) 37.0-37.9, adult: Secondary | ICD-10-CM | POA: Diagnosis not present

## 2019-04-11 DIAGNOSIS — M2041 Other hammer toe(s) (acquired), right foot: Secondary | ICD-10-CM | POA: Diagnosis not present

## 2019-04-11 DIAGNOSIS — M2042 Other hammer toe(s) (acquired), left foot: Secondary | ICD-10-CM | POA: Diagnosis not present

## 2019-04-11 DIAGNOSIS — E1122 Type 2 diabetes mellitus with diabetic chronic kidney disease: Secondary | ICD-10-CM | POA: Diagnosis not present

## 2019-04-11 DIAGNOSIS — E1140 Type 2 diabetes mellitus with diabetic neuropathy, unspecified: Secondary | ICD-10-CM | POA: Diagnosis not present

## 2019-04-14 DIAGNOSIS — E1140 Type 2 diabetes mellitus with diabetic neuropathy, unspecified: Secondary | ICD-10-CM | POA: Diagnosis not present

## 2019-04-14 DIAGNOSIS — I739 Peripheral vascular disease, unspecified: Secondary | ICD-10-CM | POA: Diagnosis not present

## 2019-04-14 DIAGNOSIS — M79672 Pain in left foot: Secondary | ICD-10-CM | POA: Diagnosis not present

## 2019-04-14 DIAGNOSIS — M79671 Pain in right foot: Secondary | ICD-10-CM | POA: Diagnosis not present

## 2019-04-26 DIAGNOSIS — Z794 Long term (current) use of insulin: Secondary | ICD-10-CM | POA: Diagnosis not present

## 2019-04-26 DIAGNOSIS — E119 Type 2 diabetes mellitus without complications: Secondary | ICD-10-CM | POA: Diagnosis not present

## 2019-04-26 DIAGNOSIS — Z961 Presence of intraocular lens: Secondary | ICD-10-CM | POA: Diagnosis not present

## 2019-04-28 DIAGNOSIS — R69 Illness, unspecified: Secondary | ICD-10-CM | POA: Diagnosis not present

## 2019-05-03 DIAGNOSIS — H8110 Benign paroxysmal vertigo, unspecified ear: Secondary | ICD-10-CM | POA: Diagnosis not present

## 2019-05-03 DIAGNOSIS — E1122 Type 2 diabetes mellitus with diabetic chronic kidney disease: Secondary | ICD-10-CM | POA: Diagnosis not present

## 2019-05-06 DIAGNOSIS — I5033 Acute on chronic diastolic (congestive) heart failure: Secondary | ICD-10-CM | POA: Diagnosis not present

## 2019-05-06 DIAGNOSIS — R69 Illness, unspecified: Secondary | ICD-10-CM | POA: Diagnosis not present

## 2019-05-06 DIAGNOSIS — I10 Essential (primary) hypertension: Secondary | ICD-10-CM | POA: Diagnosis not present

## 2019-05-09 DIAGNOSIS — R69 Illness, unspecified: Secondary | ICD-10-CM | POA: Diagnosis not present

## 2019-06-05 DIAGNOSIS — R69 Illness, unspecified: Secondary | ICD-10-CM | POA: Diagnosis not present

## 2019-06-08 DIAGNOSIS — E039 Hypothyroidism, unspecified: Secondary | ICD-10-CM | POA: Diagnosis not present

## 2019-06-08 DIAGNOSIS — I10 Essential (primary) hypertension: Secondary | ICD-10-CM | POA: Diagnosis not present

## 2019-06-08 DIAGNOSIS — E559 Vitamin D deficiency, unspecified: Secondary | ICD-10-CM | POA: Diagnosis not present

## 2019-06-08 DIAGNOSIS — E875 Hyperkalemia: Secondary | ICD-10-CM | POA: Diagnosis not present

## 2019-06-08 DIAGNOSIS — E1122 Type 2 diabetes mellitus with diabetic chronic kidney disease: Secondary | ICD-10-CM | POA: Diagnosis not present

## 2019-06-08 DIAGNOSIS — E876 Hypokalemia: Secondary | ICD-10-CM | POA: Diagnosis not present

## 2019-06-08 DIAGNOSIS — E782 Mixed hyperlipidemia: Secondary | ICD-10-CM | POA: Diagnosis not present

## 2019-06-08 DIAGNOSIS — N185 Chronic kidney disease, stage 5: Secondary | ICD-10-CM | POA: Diagnosis not present

## 2019-06-08 DIAGNOSIS — E871 Hypo-osmolality and hyponatremia: Secondary | ICD-10-CM | POA: Diagnosis not present

## 2019-06-15 DIAGNOSIS — D509 Iron deficiency anemia, unspecified: Secondary | ICD-10-CM | POA: Diagnosis not present

## 2019-06-15 DIAGNOSIS — E1122 Type 2 diabetes mellitus with diabetic chronic kidney disease: Secondary | ICD-10-CM | POA: Diagnosis not present

## 2019-06-15 DIAGNOSIS — I10 Essential (primary) hypertension: Secondary | ICD-10-CM | POA: Diagnosis not present

## 2019-06-15 DIAGNOSIS — H8110 Benign paroxysmal vertigo, unspecified ear: Secondary | ICD-10-CM | POA: Diagnosis not present

## 2019-06-15 DIAGNOSIS — I517 Cardiomegaly: Secondary | ICD-10-CM | POA: Diagnosis not present

## 2019-06-15 DIAGNOSIS — E1140 Type 2 diabetes mellitus with diabetic neuropathy, unspecified: Secondary | ICD-10-CM | POA: Diagnosis not present

## 2019-06-15 DIAGNOSIS — E782 Mixed hyperlipidemia: Secondary | ICD-10-CM | POA: Diagnosis not present

## 2019-06-15 DIAGNOSIS — Z6836 Body mass index (BMI) 36.0-36.9, adult: Secondary | ICD-10-CM | POA: Diagnosis not present

## 2019-07-07 DIAGNOSIS — I10 Essential (primary) hypertension: Secondary | ICD-10-CM | POA: Diagnosis not present

## 2019-07-07 DIAGNOSIS — E039 Hypothyroidism, unspecified: Secondary | ICD-10-CM | POA: Diagnosis not present

## 2019-07-14 DIAGNOSIS — M79672 Pain in left foot: Secondary | ICD-10-CM | POA: Diagnosis not present

## 2019-07-14 DIAGNOSIS — I739 Peripheral vascular disease, unspecified: Secondary | ICD-10-CM | POA: Diagnosis not present

## 2019-07-14 DIAGNOSIS — M79671 Pain in right foot: Secondary | ICD-10-CM | POA: Diagnosis not present

## 2019-07-14 DIAGNOSIS — E1140 Type 2 diabetes mellitus with diabetic neuropathy, unspecified: Secondary | ICD-10-CM | POA: Diagnosis not present

## 2019-07-18 DIAGNOSIS — R69 Illness, unspecified: Secondary | ICD-10-CM | POA: Diagnosis not present

## 2019-07-19 DIAGNOSIS — E039 Hypothyroidism, unspecified: Secondary | ICD-10-CM | POA: Diagnosis not present

## 2019-07-19 DIAGNOSIS — I509 Heart failure, unspecified: Secondary | ICD-10-CM | POA: Diagnosis not present

## 2019-07-19 DIAGNOSIS — S50812A Abrasion of left forearm, initial encounter: Secondary | ICD-10-CM | POA: Diagnosis not present

## 2019-07-19 DIAGNOSIS — E119 Type 2 diabetes mellitus without complications: Secondary | ICD-10-CM | POA: Diagnosis not present

## 2019-07-19 DIAGNOSIS — S0003XA Contusion of scalp, initial encounter: Secondary | ICD-10-CM | POA: Diagnosis not present

## 2019-07-19 DIAGNOSIS — S51802A Unspecified open wound of left forearm, initial encounter: Secondary | ICD-10-CM | POA: Diagnosis not present

## 2019-07-19 DIAGNOSIS — K219 Gastro-esophageal reflux disease without esophagitis: Secondary | ICD-10-CM | POA: Diagnosis not present

## 2019-07-19 DIAGNOSIS — I110 Hypertensive heart disease with heart failure: Secondary | ICD-10-CM | POA: Diagnosis not present

## 2019-07-19 DIAGNOSIS — S0990XA Unspecified injury of head, initial encounter: Secondary | ICD-10-CM | POA: Diagnosis not present

## 2019-07-19 DIAGNOSIS — S5012XA Contusion of left forearm, initial encounter: Secondary | ICD-10-CM | POA: Diagnosis not present

## 2019-07-20 DIAGNOSIS — S51802A Unspecified open wound of left forearm, initial encounter: Secondary | ICD-10-CM | POA: Diagnosis not present

## 2019-08-05 DIAGNOSIS — I10 Essential (primary) hypertension: Secondary | ICD-10-CM | POA: Diagnosis not present

## 2019-08-05 DIAGNOSIS — E1122 Type 2 diabetes mellitus with diabetic chronic kidney disease: Secondary | ICD-10-CM | POA: Diagnosis not present

## 2019-08-08 DIAGNOSIS — Z20828 Contact with and (suspected) exposure to other viral communicable diseases: Secondary | ICD-10-CM | POA: Diagnosis not present

## 2019-08-09 ENCOUNTER — Inpatient Hospital Stay (HOSPITAL_COMMUNITY)
Admission: EM | Admit: 2019-08-09 | Discharge: 2019-08-15 | DRG: 177 | Disposition: A | Discharge status: Home Health | Payer: Medicare HMO | Attending: Internal Medicine | Admitting: Internal Medicine

## 2019-08-09 ENCOUNTER — Encounter (HOSPITAL_COMMUNITY): Payer: Self-pay | Admitting: *Deleted

## 2019-08-09 ENCOUNTER — Emergency Department (HOSPITAL_COMMUNITY): Payer: Medicare HMO

## 2019-08-09 ENCOUNTER — Other Ambulatory Visit: Payer: Self-pay

## 2019-08-09 DIAGNOSIS — Z820 Family history of epilepsy and other diseases of the nervous system: Secondary | ICD-10-CM

## 2019-08-09 DIAGNOSIS — E875 Hyperkalemia: Secondary | ICD-10-CM | POA: Diagnosis present

## 2019-08-09 DIAGNOSIS — Z8249 Family history of ischemic heart disease and other diseases of the circulatory system: Secondary | ICD-10-CM | POA: Diagnosis not present

## 2019-08-09 DIAGNOSIS — R918 Other nonspecific abnormal finding of lung field: Secondary | ICD-10-CM | POA: Diagnosis not present

## 2019-08-09 DIAGNOSIS — E782 Mixed hyperlipidemia: Secondary | ICD-10-CM | POA: Diagnosis present

## 2019-08-09 DIAGNOSIS — E1122 Type 2 diabetes mellitus with diabetic chronic kidney disease: Secondary | ICD-10-CM | POA: Diagnosis not present

## 2019-08-09 DIAGNOSIS — I10 Essential (primary) hypertension: Secondary | ICD-10-CM | POA: Diagnosis present

## 2019-08-09 DIAGNOSIS — Z7902 Long term (current) use of antithrombotics/antiplatelets: Secondary | ICD-10-CM

## 2019-08-09 DIAGNOSIS — D696 Thrombocytopenia, unspecified: Secondary | ICD-10-CM | POA: Diagnosis present

## 2019-08-09 DIAGNOSIS — Z8673 Personal history of transient ischemic attack (TIA), and cerebral infarction without residual deficits: Secondary | ICD-10-CM | POA: Diagnosis not present

## 2019-08-09 DIAGNOSIS — D509 Iron deficiency anemia, unspecified: Secondary | ICD-10-CM | POA: Diagnosis present

## 2019-08-09 DIAGNOSIS — Z800 Family history of malignant neoplasm of digestive organs: Secondary | ICD-10-CM

## 2019-08-09 DIAGNOSIS — M109 Gout, unspecified: Secondary | ICD-10-CM | POA: Diagnosis not present

## 2019-08-09 DIAGNOSIS — E039 Hypothyroidism, unspecified: Secondary | ICD-10-CM | POA: Diagnosis present

## 2019-08-09 DIAGNOSIS — U071 COVID-19: Principal | ICD-10-CM | POA: Diagnosis present

## 2019-08-09 DIAGNOSIS — I959 Hypotension, unspecified: Secondary | ICD-10-CM | POA: Diagnosis not present

## 2019-08-09 DIAGNOSIS — N179 Acute kidney failure, unspecified: Secondary | ICD-10-CM | POA: Diagnosis present

## 2019-08-09 DIAGNOSIS — Z23 Encounter for immunization: Secondary | ICD-10-CM

## 2019-08-09 DIAGNOSIS — Z7982 Long term (current) use of aspirin: Secondary | ICD-10-CM

## 2019-08-09 DIAGNOSIS — R54 Age-related physical debility: Secondary | ICD-10-CM | POA: Diagnosis present

## 2019-08-09 DIAGNOSIS — J1282 Pneumonia due to coronavirus disease 2019: Secondary | ICD-10-CM | POA: Diagnosis present

## 2019-08-09 DIAGNOSIS — Z79899 Other long term (current) drug therapy: Secondary | ICD-10-CM

## 2019-08-09 DIAGNOSIS — I2510 Atherosclerotic heart disease of native coronary artery without angina pectoris: Secondary | ICD-10-CM | POA: Diagnosis not present

## 2019-08-09 DIAGNOSIS — I129 Hypertensive chronic kidney disease with stage 1 through stage 4 chronic kidney disease, or unspecified chronic kidney disease: Secondary | ICD-10-CM | POA: Diagnosis present

## 2019-08-09 DIAGNOSIS — H409 Unspecified glaucoma: Secondary | ICD-10-CM | POA: Diagnosis present

## 2019-08-09 DIAGNOSIS — J9601 Acute respiratory failure with hypoxia: Secondary | ICD-10-CM | POA: Diagnosis not present

## 2019-08-09 DIAGNOSIS — E860 Dehydration: Secondary | ICD-10-CM | POA: Diagnosis present

## 2019-08-09 DIAGNOSIS — Z955 Presence of coronary angioplasty implant and graft: Secondary | ICD-10-CM

## 2019-08-09 DIAGNOSIS — Z8711 Personal history of peptic ulcer disease: Secondary | ICD-10-CM

## 2019-08-09 DIAGNOSIS — N183 Chronic kidney disease, stage 3 unspecified: Secondary | ICD-10-CM | POA: Diagnosis not present

## 2019-08-09 DIAGNOSIS — N400 Enlarged prostate without lower urinary tract symptoms: Secondary | ICD-10-CM | POA: Diagnosis present

## 2019-08-09 DIAGNOSIS — R0600 Dyspnea, unspecified: Secondary | ICD-10-CM

## 2019-08-09 DIAGNOSIS — Z794 Long term (current) use of insulin: Secondary | ICD-10-CM | POA: Diagnosis not present

## 2019-08-09 DIAGNOSIS — Z79890 Hormone replacement therapy (postmenopausal): Secondary | ICD-10-CM

## 2019-08-09 DIAGNOSIS — R0602 Shortness of breath: Secondary | ICD-10-CM | POA: Diagnosis not present

## 2019-08-09 HISTORY — DX: Acute kidney failure, unspecified: N17.9

## 2019-08-09 LAB — CBC WITH DIFFERENTIAL/PLATELET
Abs Immature Granulocytes: 0.01 10*3/uL (ref 0.00–0.07)
Basophils Absolute: 0 10*3/uL (ref 0.0–0.1)
Basophils Relative: 0 %
Eosinophils Absolute: 0 10*3/uL (ref 0.0–0.5)
Eosinophils Relative: 0 %
HCT: 37.1 % — ABNORMAL LOW (ref 39.0–52.0)
Hemoglobin: 11.3 g/dL — ABNORMAL LOW (ref 13.0–17.0)
Immature Granulocytes: 0 %
Lymphocytes Relative: 11 %
Lymphs Abs: 0.5 10*3/uL — ABNORMAL LOW (ref 0.7–4.0)
MCH: 28.4 pg (ref 26.0–34.0)
MCHC: 30.5 g/dL (ref 30.0–36.0)
MCV: 93.2 fL (ref 80.0–100.0)
Monocytes Absolute: 0.4 10*3/uL (ref 0.1–1.0)
Monocytes Relative: 7 %
Neutro Abs: 4.1 10*3/uL (ref 1.7–7.7)
Neutrophils Relative %: 82 %
Platelets: 83 10*3/uL — ABNORMAL LOW (ref 150–400)
RBC: 3.98 MIL/uL — ABNORMAL LOW (ref 4.22–5.81)
RDW: 15.3 % (ref 11.5–15.5)
WBC: 5 10*3/uL (ref 4.0–10.5)
nRBC: 0 % (ref 0.0–0.2)

## 2019-08-09 LAB — COMPREHENSIVE METABOLIC PANEL
ALT: 26 U/L (ref 0–44)
AST: 72 U/L — ABNORMAL HIGH (ref 15–41)
Albumin: 3.6 g/dL (ref 3.5–5.0)
Alkaline Phosphatase: 63 U/L (ref 38–126)
Anion gap: 9 (ref 5–15)
BUN: 82 mg/dL — ABNORMAL HIGH (ref 8–23)
CO2: 22 mmol/L (ref 22–32)
Calcium: 8.2 mg/dL — ABNORMAL LOW (ref 8.9–10.3)
Chloride: 108 mmol/L (ref 98–111)
Creatinine, Ser: 2.55 mg/dL — ABNORMAL HIGH (ref 0.61–1.24)
GFR calc Af Amer: 26 mL/min — ABNORMAL LOW (ref >60)
GFR calc non Af Amer: 22 mL/min — ABNORMAL LOW (ref >60)
Glucose, Bld: 174 mg/dL — ABNORMAL HIGH (ref 70–99)
Potassium: 5.4 mmol/L — ABNORMAL HIGH (ref 3.5–5.1)
Sodium: 139 mmol/L (ref 135–145)
Total Bilirubin: 0.9 mg/dL (ref 0.3–1.2)
Total Protein: 7.7 g/dL (ref 6.5–8.1)

## 2019-08-09 LAB — TROPONIN I (HIGH SENSITIVITY)
Troponin I (High Sensitivity): 42 ng/L — ABNORMAL HIGH (ref <18)
Troponin I (High Sensitivity): 32 ng/L — ABNORMAL HIGH (ref <18)

## 2019-08-09 LAB — C-REACTIVE PROTEIN: CRP: 8.9 mg/dL — ABNORMAL HIGH (ref <1.0)

## 2019-08-09 LAB — RESPIRATORY PANEL BY RT PCR (FLU A&B, COVID)
Influenza A by PCR: NEGATIVE
Influenza B by PCR: NEGATIVE
SARS Coronavirus 2 by RT PCR: POSITIVE — AB

## 2019-08-09 LAB — BRAIN NATRIURETIC PEPTIDE: B Natriuretic Peptide: 95 pg/mL (ref 0.0–100.0)

## 2019-08-09 LAB — D-DIMER, QUANTITATIVE: D-Dimer, Quant: 3.69 ug/mL-FEU — ABNORMAL HIGH (ref 0.00–0.50)

## 2019-08-09 LAB — FERRITIN: Ferritin: 279 ng/mL (ref 24–336)

## 2019-08-09 LAB — FIBRINOGEN: Fibrinogen: 621 mg/dL — ABNORMAL HIGH (ref 210–475)

## 2019-08-09 LAB — LACTATE DEHYDROGENASE: LDH: 380 U/L — ABNORMAL HIGH (ref 98–192)

## 2019-08-09 LAB — GLUCOSE, CAPILLARY
Glucose-Capillary: 136 mg/dL — ABNORMAL HIGH (ref 70–99)
Glucose-Capillary: 196 mg/dL — ABNORMAL HIGH (ref 70–99)

## 2019-08-09 LAB — PROCALCITONIN: Procalcitonin: 0.2 ng/mL

## 2019-08-09 MED ORDER — ONDANSETRON HCL 4 MG/2ML IJ SOLN: 4.0000 mg | Freq: Four times a day (QID) | INTRAMUSCULAR | Status: DC | PRN

## 2019-08-09 MED ORDER — ONDANSETRON HCL 4 MG PO TABS: 4.0000 mg | ORAL_TABLET | Freq: Four times a day (QID) | ORAL | Status: DC | PRN

## 2019-08-09 MED ORDER — DOXAZOSIN MESYLATE 2 MG PO TABS
2.0000 mg | ORAL_TABLET | Freq: Every day | ORAL | Status: DC
  Administered 2019-08-10 – 2019-08-15 (×6): 2 mg via ORAL
  Filled 2019-08-09 (×6): qty 1

## 2019-08-09 MED ORDER — INSULIN ASPART 100 UNIT/ML ~~LOC~~ SOLN
0.0000 [IU] | Freq: Every day | SUBCUTANEOUS | Status: DC
  Administered 2019-08-10 – 2019-08-11 (×2): 2 [IU] via SUBCUTANEOUS
  Administered 2019-08-12: 0 [IU] via SUBCUTANEOUS
  Administered 2019-08-12 – 2019-08-13 (×2): 2 [IU] via SUBCUTANEOUS
  Administered 2019-08-14: 3 [IU] via SUBCUTANEOUS

## 2019-08-09 MED ORDER — DEXAMETHASONE SODIUM PHOSPHATE 10 MG/ML IJ SOLN
10.0000 mg | Freq: Once | INTRAMUSCULAR | Status: AC
  Administered 2019-08-09: 10 mg via INTRAVENOUS
  Filled 2019-08-09: qty 1

## 2019-08-09 MED ORDER — LEVOTHYROXINE SODIUM 100 MCG PO TABS
100.0000 ug | ORAL_TABLET | Freq: Every day | ORAL | Status: DC
  Administered 2019-08-10 – 2019-08-15 (×6): 100 ug via ORAL
  Filled 2019-08-09 (×6): qty 1

## 2019-08-09 MED ORDER — DEXAMETHASONE SODIUM PHOSPHATE 10 MG/ML IJ SOLN
6.0000 mg | INTRAMUSCULAR | Status: DC
  Administered 2019-08-11 – 2019-08-15 (×5): 6 mg via INTRAVENOUS
  Filled 2019-08-09 (×7): qty 1

## 2019-08-09 MED ORDER — ACETAMINOPHEN 325 MG PO TABS
650.0000 mg | ORAL_TABLET | Freq: Four times a day (QID) | ORAL | Status: DC | PRN
  Administered 2019-08-09 – 2019-08-12 (×2): 650 mg via ORAL
  Filled 2019-08-09 (×2): qty 2

## 2019-08-09 MED ORDER — INSULIN ASPART 100 UNIT/ML ~~LOC~~ SOLN
0.0000 [IU] | Freq: Three times a day (TID) | SUBCUTANEOUS | Status: DC
  Administered 2019-08-10 (×2): 3 [IU] via SUBCUTANEOUS
  Administered 2019-08-10: 2 [IU] via SUBCUTANEOUS
  Administered 2019-08-11 (×3): 1 [IU] via SUBCUTANEOUS
  Administered 2019-08-12: 5 [IU] via SUBCUTANEOUS
  Administered 2019-08-12: 1 [IU] via SUBCUTANEOUS
  Administered 2019-08-12 – 2019-08-13 (×2): 2 [IU] via SUBCUTANEOUS
  Administered 2019-08-13: 1 [IU] via SUBCUTANEOUS
  Administered 2019-08-13: 2 [IU] via SUBCUTANEOUS
  Administered 2019-08-14: 5 [IU] via SUBCUTANEOUS
  Administered 2019-08-14: 2 [IU] via SUBCUTANEOUS
  Administered 2019-08-14: 5 [IU] via SUBCUTANEOUS
  Administered 2019-08-15: 2 [IU] via SUBCUTANEOUS
  Administered 2019-08-15: 3 [IU] via SUBCUTANEOUS

## 2019-08-09 MED ORDER — INSULIN GLARGINE 100 UNIT/ML ~~LOC~~ SOLN
25.0000 [IU] | Freq: Every day | SUBCUTANEOUS | Status: DC
  Administered 2019-08-09 – 2019-08-14 (×6): 25 [IU] via SUBCUTANEOUS
  Filled 2019-08-09 (×8): qty 0.25

## 2019-08-09 MED ORDER — POLYETHYLENE GLYCOL 3350 17 G PO PACK: 17.0000 g | PACK | Freq: Every day | ORAL | Status: DC | PRN

## 2019-08-09 MED ORDER — PNEUMOCOCCAL VAC POLYVALENT 25 MCG/0.5ML IJ INJ
0.5000 mL | INJECTION | INTRAMUSCULAR | Status: AC
  Administered 2019-08-10: 0.5 mL via INTRAMUSCULAR
  Filled 2019-08-09: qty 0.5

## 2019-08-09 MED ORDER — SODIUM CHLORIDE 0.9 % IV SOLN: INTRAVENOUS | Status: AC

## 2019-08-09 MED ORDER — CARVEDILOL 12.5 MG PO TABS
12.5000 mg | ORAL_TABLET | Freq: Two times a day (BID) | ORAL | Status: DC
  Administered 2019-08-09 – 2019-08-15 (×10): 12.5 mg via ORAL
  Filled 2019-08-09 (×12): qty 1

## 2019-08-09 MED ORDER — SODIUM CHLORIDE 0.9 % IV BOLUS
1000.0000 mL | Freq: Once | INTRAVENOUS | Status: AC
  Administered 2019-08-09: 1000 mL via INTRAVENOUS

## 2019-08-09 MED ORDER — ZINC SULFATE 220 (50 ZN) MG PO CAPS
220.0000 mg | ORAL_CAPSULE | Freq: Every day | ORAL | Status: DC
  Administered 2019-08-09 – 2019-08-15 (×7): 220 mg via ORAL
  Filled 2019-08-09 (×8): qty 1

## 2019-08-09 MED ORDER — AMLODIPINE BESYLATE 5 MG PO TABS
5.0000 mg | ORAL_TABLET | Freq: Every day | ORAL | Status: DC
  Administered 2019-08-10 – 2019-08-11 (×2): 5 mg via ORAL
  Filled 2019-08-09 (×3): qty 1

## 2019-08-09 MED ORDER — ATORVASTATIN CALCIUM 80 MG PO TABS
80.0000 mg | ORAL_TABLET | Freq: Every day | ORAL | Status: DC
  Administered 2019-08-09 – 2019-08-15 (×7): 80 mg via ORAL
  Filled 2019-08-09 (×6): qty 1
  Filled 2019-08-09 (×2): qty 2

## 2019-08-09 MED ORDER — ASCORBIC ACID 500 MG PO TABS
500.0000 mg | ORAL_TABLET | Freq: Every day | ORAL | Status: DC
  Administered 2019-08-09 – 2019-08-15 (×7): 500 mg via ORAL
  Filled 2019-08-09 (×8): qty 1

## 2019-08-09 NOTE — H&P (Addendum)
History and Physical    Lorin Picket. [id number] DOB: December 11, 1931 DOA: 08/09/2019  PCP: [name], MD   Patient coming from: Home  I have personally briefly reviewed patient's old medical records in [place]  Chief Complaint: SOB, Weakness  HPI: Paul Wood. is a 86 y.o. male with medical history significant for  DM, HTN, CAD, CKD3, HTN, depression.  Presented to the ED with complaints of feeling ill for the past 3 weeks, reports difficulty breathing and cough also.  Reports mild swelling to his right lower extremity after he had some mild trauma to his leg.  Denies history of PE or blood clots.  No chest pain. Reports one episode of vomiting yesterday otherwise he has had good p.o. intake and denies diarrhea.  He also denies taking his Lasix.  ED Course: O2 sats greater than 94% on room air, temperature 98.2.  Tachypneic.  WBC 5.  Platelets low at 83.  Creatinine elevated 2.55 with elevated BUN of 82. HS troponin 42>> 32.  D-dimer elevated at 3.69.  Portable chest x-ray shows right upper lobe subsegmental atelectasis only.  EKG unremarkable.  1 L bolus normal saline given in ED.  Hospitalist admit for acute kidney injury in the setting of COVID-19 infection.  Review of Systems: As per HPI all other systems reviewed and negative.  Past Medical History:  Diagnosis Date  . Coronary atherosclerosis of native coronary artery    4/12 - DES mid RCA, DES distal LAD, DES x2 mid LAD  . Depression   . Essential hypertension, benign   . Gout   . Hearing loss   . Hypertrophy of prostate without urinary obstruction and other lower urinary tract symptoms (LUTS)   . Hypothyroidism   . Incarcerated umbilical hernia   . Mixed hyperlipidemia   . Proteinuria    Mild  . PUD (peptic ulcer disease)   . Stroke [facility])    2008; slight st memory deficirs since last stroke  . Type 2 diabetes mellitus ([place])   . Ventral hernia     Past Surgical History:  Procedure Laterality Date    . CATARACT EXTRACTION W/PHACO Right 08/15/2014   Procedure: CATARACT EXTRACTION PHACO AND INTRAOCULAR LENS PLACEMENT (IOC);  Surgeon: Elta Guadeloupe T. Gershon Crane, MD;  Location: [facility];  Service: Ophthalmology;  Laterality: Right;  CDE:10.77  . HERNIA REPAIR  01/04/14   Dr. Aaron Edelman [place];umbilical  . MULTIPLE TOOTH EXTRACTIONS     HX DENTAL CARIES/ALMOST ALL REMOVED     reports that he has never smoked. He has never used smokeless tobacco. He reports that he does not drink alcohol or use drugs.  No Known Allergies  Family History  Problem Relation Age of Onset  . Gout Father        DIED AGE 22-HOUSE FIRE  . Alzheimer's disease Mother        DIED AGE 34/uncertain hx  . Colon cancer Sister        38 sisters:1-colon cancer age 9's  . Gout Son        age 39  . Hypertension Son        age 35  . Hyperlipidemia Son        age 97  . Other Brother        2 brothers deceased:1-gun shot wound & 1-hypothermia(froze to death)  . Other Daughter        age 29:leg problems,Knee OA    Prior to Admission medications   Medication Sig Start  Date End Date Taking? Authorizing Provider  allopurinol (ZYLOPRIM) 300 MG tablet Take 300 mg by mouth daily.    [provider]  amLODipine (NORVASC) 5 MG tablet Take 1 tablet (5 mg total) by mouth daily. 05/19/15   Donne Hazel, MD  aspirin EC 81 MG tablet Take 81 mg by mouth daily.    [provider]  atorvastatin (LIPITOR) 80 MG tablet TAKE 1 TABLET BY MOUTH AT BEDTIME ( PT NEEDS OFFICE VISIT) Patient taking differently: Take 80 mg by mouth daily.  09/06/14   Satira Sark, MD  carvedilol (COREG) 12.5 MG tablet TAKE 1 TABLET BY MOUTH TWICE DAILY Patient taking differently: Take 12.5 mg by mouth 2 (two) times daily.  05/29/14   Satira Sark, MD  clopidogrel (PLAVIX) 75 MG tablet TAKE 1 TABLET BY MOUTH EVERY DAY (PATIENT NEEDS OFFICE VISIT) Patient taking differently: Take 75 mg by mouth daily.  01/27/14   Satira Sark, MD  doxazosin  (CARDURA) 2 MG tablet Take 2 mg by mouth daily.    [provider]  ferrous sulfate 325 (65 FE) MG tablet Take 325 mg by mouth daily with breakfast.    [provider]  furosemide (LASIX) 20 MG tablet Take 20 mg by mouth 2 (two) times daily.    [provider]  Insulin Glargine (LANTUS SOLOSTAR) 100 UNIT/ML Solostar Pen Inject 18 Units into the skin at bedtime.    [provider]  levothyroxine (SYNTHROID, LEVOTHROID) 100 MCG tablet Take 1 tablet (100 mcg total) by mouth daily before breakfast. 05/20/15   Donne Hazel, MD  lisinopril (PRINIVIL,ZESTRIL) 20 MG tablet TAKE 1 TABLET BY MOUTH EVERY DAY (PT NEEDS OFFICE VISIT) Patient taking differently: Take 20 mg by mouth daily.  09/06/14   Satira Sark, MD  meclizine (ANTIVERT) 25 MG tablet Take 25 mg by mouth 3 (three) times daily as needed for dizziness.    [provider]  pantoprazole (PROTONIX) 40 MG tablet Take 40 mg by mouth daily.     [provider]  tamsulosin (FLOMAX) 0.4 MG CAPS capsule Take 0.4 mg by mouth daily. 06/27/13   [provider]  Vitamin D, Ergocalciferol, (DRISDOL) 50000 units CAPS capsule Take 50,000 Units by mouth every 7 (seven) days.    [provider]    Physical Exam: Vitals:   08/09/19 1153 08/09/19 1154 08/09/19 1155 08/09/19 1356  BP:    (!) 152/71  Pulse:  72  73  Resp:    (!) 25  Temp:   98.2 F (36.8 C)   TempSrc:   Oral   SpO2:  95%  94%  Weight: (!) 142.9 kg     Height: 6\' 2"  (1.88 m)       Constitutional: NAD, calm, comfortable Vitals:   08/09/19 1153 08/09/19 1154 08/09/19 1155 08/09/19 1356  BP:    (!) 152/71  Pulse:  72  73  Resp:    (!) 25  Temp:   98.2 F (36.8 C)   TempSrc:   Oral   SpO2:  95%  94%  Weight: (!) 142.9 kg     Height: 6\' 2"  (1.88 m)      Eyes: PERRL, lids and conjunctivae normal ENMT: Mucous membranes are dry Neck: normal, supple, no masses, no thyromegaly Respiratory: clear to auscultation  bilaterally, no wheezing, no crackles. Normal respiratory effort. No accessory muscle use.  Cardiovascular: Regular rate and rhythm,  trace right lower extremity edema only. 2+ pedal pulses.  Abdomen: no tenderness, abdomen soft. Musculoskeletal: no clubbing / cyanosis. No joint deformity upper and lower extremities.  Skin: no rashes, lesions, ulcers. No induration Neurologic: Cranial nerve abnormality.  Moving all extremities spontaneously. Psychiatric: Normal judgment and insight. Alert and oriented x 3. Normal mood.   Labs on Admission: I have personally reviewed following labs and imaging studies  CBC: Recent Labs  Lab 08/09/19 1320  WBC 5.0  NEUTROABS 4.1  HGB 11.3*  HCT 37.1*  MCV 93.2  PLT 83*   Basic Metabolic Panel: Recent Labs  Lab 08/09/19 1320  NA 139  K 5.4*  CL 108  CO2 22  GLUCOSE 174*  BUN 82*  CREATININE 2.55*  CALCIUM 8.2*   Liver Function Tests: Recent Labs  Lab 08/09/19 1320  AST 72*  ALT 26  ALKPHOS 63  BILITOT 0.9  PROT 7.7  ALBUMIN 3.6    Radiological Exams on Admission: DG Chest Portable 1 View  Result Date: 08/09/2019 CLINICAL DATA:  Weakness, short of breath, lower extremity edema EXAM: PORTABLE CHEST 1 VIEW COMPARISON:  02/08/2015 FINDINGS: Single frontal view of the chest demonstrates an unremarkable cardiac silhouette. Likely subsegmental atelectasis right upper lobe abutting the minor fissure. No acute airspace disease, effusion, or pneumothorax. Hypertrophic changes overlying the anterior right first rib unchanged. Colonic interposition right upper quadrant. IMPRESSION: 1. Likely subsegmental atelectasis right upper lobe. 2. No acute intrathoracic process. Electronically Signed   By: Randa Ngo M.D.   On: 08/09/2019 12:34    EKG: Independently reviewed.  Sinus rhythm, LAFB.  QTc 414.  No significant ST or T wave changes compared to prior EKG.  Assessment/Plan Principal Problem:   AKI (acute kidney injury) ([place]) Active  Problems:   COVID-19 virus infection   Type 2 diabetes mellitus with stage 3 chronic kidney disease, with long-term current use of insulin (HCC)   Essential hypertension, benign   Coronary atherosclerosis of native coronary artery   CKD (chronic kidney disease), stage III    Acute kidney injury on CKD 3-creatinine elevated 2.55, baseline 1.6-1.7.  In the setting of COVID-19 infection, and ACE-inh use.  Denies GI losses.  Appears clinically dehydrated.  Home medication list has lasix 20 mg q12h, patient denies being on this. - 1 l bolus given continue N/s 100cc/hr x 15 hrs.  -BMP a.m. -Hold Lasix, lisinopril  COVID-19 infection- dyspnea, cough, tachypneic.  O2 sats greater than 94% on room air.  WBC 5.  D-dimer elevated at 3.69.  No chest pain.  No wheeze/rhonchi. No history of PE or DVT. Chest x-ray with subsegmental atelectasis RUL. -Obtain inflammatory markers, and trend daily -10 mg dexamethasone x1, continue 6 mg daily -Repeat chest x-ray in a.m. after hydration -VQ scan ordered for a.m. -Held off on therapeutic dose anticoagulation in case of PE because of thrombocytopenia platelets 83. - Addendum- patients nurse just told me Nuc med unavailable to do scan till 2/26.  Hence patient will be admitted to [facility] for VQ scan.  Thrombocytopenia-platelets 78, likely from COVID-19 viral infection.  Baseline from 4 years ago 180s to 200s. -Hold on aspirin, Plavix - SCDs -CBC a.m.  Diabetes mellitus-random glucose 174. -Check hemoglobin A1c - SSI -Resume home medication Lantus at increased dose 18 >> 25u in anticipation of hyperglycemia from steroids  Hypertension -systolic [id number] to [id number]. -, Resume home Norvasc 5 mg carvedilol 12.5, doxazosin 2 mg -Hold home Lasix and lisinopril  History of coronary artery disease history of coronary revascularization with drug-eluting stents. -Hold aspirin and Plavix  with thrombocytopenia -Continue carvedilol.   DVT prophylaxis: SCDS Code  Status: Full code Family Communication: None at bedside Disposition Plan: Pending improvement in renal function, and stable respiratory status.  Likely DC home Consults called: None Admission status: Observation, telemetry  Bethena Roys MD [facility]  08/09/2019, 5:03 PM

## 2019-08-09 NOTE — ED Notes (Signed)
Attempted IV access x 1 for CT Angio unsuccessful.

## 2019-08-09 NOTE — ED Triage Notes (Signed)
Pt c/o sob at night for past 3-4 weeks.  Pt admits to leg swelling.

## 2019-08-09 NOTE — ED Provider Notes (Signed)
[facility] Provider Note   CSN: [id number] Arrival date & time: 08/09/19  1129     History Chief Complaint  Patient presents with  . Shortness of Breath    Paul Wood. is a 86 y.o. male.  Patient complains of shortness of breath.  Patient states she has been sick for couple weeks now.  Patient does have a history of coronary artery disease.  The history is provided by the patient.  Shortness of Breath Severity:  Moderate Onset quality:  Sudden Timing:  Constant Progression:  Worsening Chronicity:  New Context: activity   Relieved by:  Nothing Associated symptoms: no abdominal pain, no chest pain, no cough, no headaches and no rash        Past Medical History:  Diagnosis Date  . Coronary atherosclerosis of native coronary artery    4/12 - DES mid RCA, DES distal LAD, DES x2 mid LAD  . Depression   . Essential hypertension, benign   . Gout   . Hearing loss   . Hypertrophy of prostate without urinary obstruction and other lower urinary tract symptoms (LUTS)   . Hypothyroidism   . Incarcerated umbilical hernia   . Mixed hyperlipidemia   . Proteinuria    Mild  . PUD (peptic ulcer disease)   . Stroke [facility])    2008; slight st memory deficirs since last stroke  . Type 2 diabetes mellitus ([place])   . Ventral hernia     Patient Active Problem List   Diagnosis Date Noted  . Hypothyroidism 03/30/2017  . DM type 2 causing vascular disease ([place]) 03/30/2017  . Accelerated hypertension 05/17/2015  . TIA (transient ischemic attack) 02/08/2015  . CKD (chronic kidney disease), stage III 02/08/2015  . GERD without esophagitis 02/08/2015  . BPH (benign prostatic hyperplasia) 02/08/2015  . Shortness of breath 01/20/2014  . Coronary atherosclerosis of native coronary artery 11/01/2010  . Chronic renal insufficiency 11/01/2010  . Mixed hyperlipidemia 10/13/2008  . Type 2 diabetes mellitus with stage 3 chronic kidney disease, with long-term current use  of insulin ([place]) 09/29/2008  . Class 1 obesity due to excess calories with serious comorbidity and body mass index (BMI) of 33.0 to 33.9 in adult 09/29/2008  . Essential hypertension, benign 09/29/2008    Past Surgical History:  Procedure Laterality Date  . CATARACT EXTRACTION W/PHACO Right 08/15/2014   Procedure: CATARACT EXTRACTION PHACO AND INTRAOCULAR LENS PLACEMENT (IOC);  Surgeon: Elta Guadeloupe T. Gershon Crane, MD;  Location: [facility];  Service: Ophthalmology;  Laterality: Right;  CDE:10.77  . HERNIA REPAIR  01/04/14   Dr. Aaron Edelman [place];umbilical  . MULTIPLE TOOTH EXTRACTIONS     HX DENTAL CARIES/ALMOST ALL REMOVED       Family History  Problem Relation Age of Onset  . Gout Father        DIED AGE 3-HOUSE FIRE  . Alzheimer's disease Mother        DIED AGE 68/uncertain hx  . Colon cancer Sister        53 sisters:1-colon cancer age 22's  . Gout Son        age 59  . Hypertension Son        age 71  . Hyperlipidemia Son        age 59  . Other Brother        2 brothers deceased:1-gun shot wound & 1-hypothermia(froze to death)  . Other Daughter        age 61:leg problems,Knee OA    Social History  Tobacco Use  . Smoking status: Never Smoker  . Smokeless tobacco: Never Used  Substance Use Topics  . Alcohol use: No    Alcohol/week: 0.0 standard drinks  . Drug use: No    Home Medications Prior to Admission medications   Medication Sig Start Date End Date Taking? Authorizing Provider  allopurinol (ZYLOPRIM) 300 MG tablet Take 300 mg by mouth daily.    [provider]  amLODipine (NORVASC) 5 MG tablet Take 1 tablet (5 mg total) by mouth daily. 05/19/15   Donne Hazel, MD  aspirin EC 81 MG tablet Take 81 mg by mouth daily.    [provider]  atorvastatin (LIPITOR) 80 MG tablet TAKE 1 TABLET BY MOUTH AT BEDTIME ( PT NEEDS OFFICE VISIT) Patient taking differently: Take 80 mg by mouth daily.  09/06/14   Satira Sark, MD  carvedilol (COREG) 12.5 MG tablet TAKE 1  TABLET BY MOUTH TWICE DAILY Patient taking differently: Take 12.5 mg by mouth 2 (two) times daily.  05/29/14   Satira Sark, MD  clopidogrel (PLAVIX) 75 MG tablet TAKE 1 TABLET BY MOUTH EVERY DAY (PATIENT NEEDS OFFICE VISIT) Patient taking differently: Take 75 mg by mouth daily.  01/27/14   Satira Sark, MD  doxazosin (CARDURA) 2 MG tablet Take 2 mg by mouth daily.    [provider]  ferrous sulfate 325 (65 FE) MG tablet Take 325 mg by mouth daily with breakfast.    [provider]  furosemide (LASIX) 20 MG tablet Take 20 mg by mouth 2 (two) times daily.    [provider]  Insulin Glargine (LANTUS SOLOSTAR) 100 UNIT/ML Solostar Pen Inject 18 Units into the skin at bedtime.    [provider]  levothyroxine (SYNTHROID, LEVOTHROID) 100 MCG tablet Take 1 tablet (100 mcg total) by mouth daily before breakfast. 05/20/15   Donne Hazel, MD  lisinopril (PRINIVIL,ZESTRIL) 20 MG tablet TAKE 1 TABLET BY MOUTH EVERY DAY (PT NEEDS OFFICE VISIT) Patient taking differently: Take 20 mg by mouth daily.  09/06/14   Satira Sark, MD  meclizine (ANTIVERT) 25 MG tablet Take 25 mg by mouth 3 (three) times daily as needed for dizziness.    [provider]  pantoprazole (PROTONIX) 40 MG tablet Take 40 mg by mouth daily.     [provider]  tamsulosin (FLOMAX) 0.4 MG CAPS capsule Take 0.4 mg by mouth daily. 06/27/13   [provider]  Vitamin D, Ergocalciferol, (DRISDOL) 50000 units CAPS capsule Take 50,000 Units by mouth every 7 (seven) days.    [provider]    Allergies    Patient has no known allergies.  Review of Systems   Review of Systems  Constitutional: Negative for appetite change and fatigue.  HENT: Negative for congestion, ear discharge and sinus pressure.   Eyes: Negative for discharge.  Respiratory: Positive for shortness of breath. Negative for cough.   Cardiovascular: Negative for chest pain.    Gastrointestinal: Negative for abdominal pain and diarrhea.  Genitourinary: Negative for frequency and hematuria.  Musculoskeletal: Negative for back pain.  Skin: Negative for rash.  Neurological: Negative for seizures and headaches.  Psychiatric/Behavioral: Negative for hallucinations.    Physical Exam Updated Vital Signs BP (!) 152/71 (BP Location: Left Arm)   Pulse 73   Temp 98.2 F (36.8 C) (Oral)   Resp (!) 25   Ht 6\' 2"  (1.88 m)   Wt (!) 142.9 kg   SpO2 94%   BMI  40.44 kg/m   Physical Exam Constitutional:      Appearance: He is well-developed.  HENT:     Head: Normocephalic.     Nose: Nose normal.  Eyes:     General: No scleral icterus.    Conjunctiva/sclera: Conjunctivae normal.  Neck:     Thyroid: No thyromegaly.  Cardiovascular:     Rate and Rhythm: Normal rate and regular rhythm.     Heart sounds: No murmur. No friction rub. No gallop.   Pulmonary:     Breath sounds: No stridor. No wheezing or rales.  Chest:     Chest wall: No tenderness.  Abdominal:     General: There is no distension.     Tenderness: There is no abdominal tenderness. There is no rebound.  Musculoskeletal:        General: Normal range of motion.     Cervical back: Neck supple.  Lymphadenopathy:     Cervical: No cervical adenopathy.  Skin:    Findings: No erythema or rash.  Neurological:     Mental Status: He is alert and oriented to person, place, and time.     Motor: No abnormal muscle tone.     Coordination: Coordination normal.  Psychiatric:        Behavior: Behavior normal.     ED Results / Procedures / Treatments   Labs (all labs ordered are listed, but only abnormal results are displayed) Labs Reviewed  RESPIRATORY PANEL BY RT PCR (FLU A&B, COVID) - Abnormal; Notable for the following components:      Result Value   SARS Coronavirus 2 by RT PCR POSITIVE (*)    All other components within normal limits  CBC WITH DIFFERENTIAL/PLATELET - Abnormal; Notable for the  following components:   RBC 3.98 (*)    Hemoglobin 11.3 (*)    HCT 37.1 (*)    Platelets 83 (*)    Lymphs Abs 0.5 (*)    All other components within normal limits  COMPREHENSIVE METABOLIC PANEL - Abnormal; Notable for the following components:   Potassium 5.4 (*)    Glucose, Bld 174 (*)    BUN 82 (*)    Creatinine, Ser 2.55 (*)    Calcium 8.2 (*)    AST 72 (*)    GFR calc non Af Amer 22 (*)    GFR calc Af Amer 26 (*)    All other components within normal limits  D-DIMER, QUANTITATIVE (NOT AT [facility]) - Abnormal; Notable for the following components:   D-Dimer, Quant 3.69 (*)    All other components within normal limits  TROPONIN I (HIGH SENSITIVITY) - Abnormal; Notable for the following components:   Troponin I (High Sensitivity) 42 (*)    All other components within normal limits  BRAIN NATRIURETIC PEPTIDE  TROPONIN I (HIGH SENSITIVITY)    EKG None  Radiology DG Chest Portable 1 View  Result Date: 08/09/2019 CLINICAL DATA:  Weakness, short of breath, lower extremity edema EXAM: PORTABLE CHEST 1 VIEW COMPARISON:  02/08/2015 FINDINGS: Single frontal view of the chest demonstrates an unremarkable cardiac silhouette. Likely subsegmental atelectasis right upper lobe abutting the minor fissure. No acute airspace disease, effusion, or pneumothorax. Hypertrophic changes overlying the anterior right first rib unchanged. Colonic interposition right upper quadrant. IMPRESSION: 1. Likely subsegmental atelectasis right upper lobe. 2. No acute intrathoracic process. Electronically Signed   By: Randa Ngo M.D.   On: 08/09/2019 12:34    Procedures Procedures (including critical care time)  Medications Ordered in ED  Medications  sodium chloride 0.9 % bolus 1,000 mL (1,000 mLs Intravenous New Bag/Given 08/09/19 1354)    ED Course  I have reviewed the triage vital signs and the nursing notes.  Pertinent labs & imaging results that were available during my care of the patient were reviewed  by me and considered in my medical decision making (see chart for details).    MDM Rules/Calculators/A&P                      Labs show patient is positive for Covid.  His O2 sats just laying in the bed are 91% with tachypnea.  Patient also has an AKI Final Clinical Impression(s) / ED Diagnoses Final diagnoses:  None    Rx / DC Orders ED Discharge Orders    None       Milton Ferguson, MD 08/09/19 [phone]

## 2019-08-10 ENCOUNTER — Other Ambulatory Visit: Payer: Self-pay

## 2019-08-10 ENCOUNTER — Observation Stay (HOSPITAL_COMMUNITY): Payer: Medicare HMO

## 2019-08-10 ENCOUNTER — Inpatient Hospital Stay (HOSPITAL_COMMUNITY): Payer: Medicare HMO

## 2019-08-10 DIAGNOSIS — Z8673 Personal history of transient ischemic attack (TIA), and cerebral infarction without residual deficits: Secondary | ICD-10-CM | POA: Diagnosis not present

## 2019-08-10 DIAGNOSIS — Z7902 Long term (current) use of antithrombotics/antiplatelets: Secondary | ICD-10-CM | POA: Diagnosis not present

## 2019-08-10 DIAGNOSIS — E782 Mixed hyperlipidemia: Secondary | ICD-10-CM | POA: Diagnosis present

## 2019-08-10 DIAGNOSIS — J9601 Acute respiratory failure with hypoxia: Secondary | ICD-10-CM | POA: Diagnosis not present

## 2019-08-10 DIAGNOSIS — R918 Other nonspecific abnormal finding of lung field: Secondary | ICD-10-CM | POA: Diagnosis not present

## 2019-08-10 DIAGNOSIS — Z23 Encounter for immunization: Secondary | ICD-10-CM | POA: Diagnosis not present

## 2019-08-10 DIAGNOSIS — N179 Acute kidney failure, unspecified: Secondary | ICD-10-CM | POA: Diagnosis not present

## 2019-08-10 DIAGNOSIS — E1122 Type 2 diabetes mellitus with diabetic chronic kidney disease: Secondary | ICD-10-CM

## 2019-08-10 DIAGNOSIS — U071 COVID-19: Principal | ICD-10-CM

## 2019-08-10 DIAGNOSIS — R0602 Shortness of breath: Secondary | ICD-10-CM | POA: Diagnosis not present

## 2019-08-10 DIAGNOSIS — Z7982 Long term (current) use of aspirin: Secondary | ICD-10-CM | POA: Diagnosis not present

## 2019-08-10 DIAGNOSIS — R54 Age-related physical debility: Secondary | ICD-10-CM | POA: Diagnosis present

## 2019-08-10 DIAGNOSIS — E875 Hyperkalemia: Secondary | ICD-10-CM | POA: Diagnosis present

## 2019-08-10 DIAGNOSIS — Z794 Long term (current) use of insulin: Secondary | ICD-10-CM | POA: Diagnosis not present

## 2019-08-10 DIAGNOSIS — I10 Essential (primary) hypertension: Secondary | ICD-10-CM | POA: Diagnosis not present

## 2019-08-10 DIAGNOSIS — N183 Chronic kidney disease, stage 3 unspecified: Secondary | ICD-10-CM

## 2019-08-10 DIAGNOSIS — M109 Gout, unspecified: Secondary | ICD-10-CM | POA: Diagnosis not present

## 2019-08-10 DIAGNOSIS — J1282 Pneumonia due to coronavirus disease 2019: Secondary | ICD-10-CM | POA: Diagnosis not present

## 2019-08-10 DIAGNOSIS — I2510 Atherosclerotic heart disease of native coronary artery without angina pectoris: Secondary | ICD-10-CM

## 2019-08-10 DIAGNOSIS — Z800 Family history of malignant neoplasm of digestive organs: Secondary | ICD-10-CM | POA: Diagnosis not present

## 2019-08-10 DIAGNOSIS — D696 Thrombocytopenia, unspecified: Secondary | ICD-10-CM | POA: Diagnosis present

## 2019-08-10 DIAGNOSIS — I959 Hypotension, unspecified: Secondary | ICD-10-CM | POA: Diagnosis not present

## 2019-08-10 DIAGNOSIS — H409 Unspecified glaucoma: Secondary | ICD-10-CM | POA: Diagnosis not present

## 2019-08-10 DIAGNOSIS — E039 Hypothyroidism, unspecified: Secondary | ICD-10-CM | POA: Diagnosis present

## 2019-08-10 DIAGNOSIS — Z8249 Family history of ischemic heart disease and other diseases of the circulatory system: Secondary | ICD-10-CM | POA: Diagnosis not present

## 2019-08-10 DIAGNOSIS — D509 Iron deficiency anemia, unspecified: Secondary | ICD-10-CM | POA: Diagnosis present

## 2019-08-10 DIAGNOSIS — I129 Hypertensive chronic kidney disease with stage 1 through stage 4 chronic kidney disease, or unspecified chronic kidney disease: Secondary | ICD-10-CM | POA: Diagnosis not present

## 2019-08-10 DIAGNOSIS — E860 Dehydration: Secondary | ICD-10-CM | POA: Diagnosis present

## 2019-08-10 HISTORY — DX: Hyperkalemia: E87.5

## 2019-08-10 LAB — CBC WITH DIFFERENTIAL/PLATELET
Abs Immature Granulocytes: 0.01 10*3/uL (ref 0.00–0.07)
Basophils Absolute: 0 10*3/uL (ref 0.0–0.1)
Basophils Relative: 0 %
Eosinophils Absolute: 0 10*3/uL (ref 0.0–0.5)
Eosinophils Relative: 0 %
HCT: 32.7 % — ABNORMAL LOW (ref 39.0–52.0)
Hemoglobin: 10.1 g/dL — ABNORMAL LOW (ref 13.0–17.0)
Immature Granulocytes: 0 %
Lymphocytes Relative: 14 %
Lymphs Abs: 0.4 10*3/uL — ABNORMAL LOW (ref 0.7–4.0)
MCH: 28.5 pg (ref 26.0–34.0)
MCHC: 30.9 g/dL (ref 30.0–36.0)
MCV: 92.1 fL (ref 80.0–100.0)
Monocytes Absolute: 0.1 10*3/uL (ref 0.1–1.0)
Monocytes Relative: 3 %
Neutro Abs: 2.7 10*3/uL (ref 1.7–7.7)
Neutrophils Relative %: 83 %
Platelets: 82 10*3/uL — ABNORMAL LOW (ref 150–400)
RBC: 3.55 MIL/uL — ABNORMAL LOW (ref 4.22–5.81)
RDW: 15.1 % (ref 11.5–15.5)
WBC: 3.2 10*3/uL — ABNORMAL LOW (ref 4.0–10.5)
nRBC: 0 % (ref 0.0–0.2)

## 2019-08-10 LAB — COMPREHENSIVE METABOLIC PANEL
ALT: 25 U/L (ref 0–44)
AST: 65 U/L — ABNORMAL HIGH (ref 15–41)
Albumin: 3 g/dL — ABNORMAL LOW (ref 3.5–5.0)
Alkaline Phosphatase: 55 U/L (ref 38–126)
Anion gap: 9 (ref 5–15)
BUN: 75 mg/dL — ABNORMAL HIGH (ref 8–23)
CO2: 19 mmol/L — ABNORMAL LOW (ref 22–32)
Calcium: 7.8 mg/dL — ABNORMAL LOW (ref 8.9–10.3)
Chloride: 111 mmol/L (ref 98–111)
Creatinine, Ser: 2.16 mg/dL — ABNORMAL HIGH (ref 0.61–1.24)
GFR calc Af Amer: 31 mL/min — ABNORMAL LOW (ref >60)
GFR calc non Af Amer: 27 mL/min — ABNORMAL LOW (ref >60)
Glucose, Bld: 221 mg/dL — ABNORMAL HIGH (ref 70–99)
Potassium: 5.2 mmol/L — ABNORMAL HIGH (ref 3.5–5.1)
Sodium: 139 mmol/L (ref 135–145)
Total Bilirubin: 0.7 mg/dL (ref 0.3–1.2)
Total Protein: 6.4 g/dL — ABNORMAL LOW (ref 6.5–8.1)

## 2019-08-10 LAB — D-DIMER, QUANTITATIVE: D-Dimer, Quant: 3.07 ug/mL-FEU — ABNORMAL HIGH (ref 0.00–0.50)

## 2019-08-10 LAB — C-REACTIVE PROTEIN: CRP: 8.1 mg/dL — ABNORMAL HIGH (ref <1.0)

## 2019-08-10 LAB — FERRITIN: Ferritin: 240 ng/mL (ref 24–336)

## 2019-08-10 LAB — GLUCOSE, CAPILLARY
Glucose-Capillary: 213 mg/dL — ABNORMAL HIGH (ref 70–99)
Glucose-Capillary: 231 mg/dL — ABNORMAL HIGH (ref 70–99)
Glucose-Capillary: 195 mg/dL — ABNORMAL HIGH (ref 70–99)
Glucose-Capillary: 215 mg/dL — ABNORMAL HIGH (ref 70–99)

## 2019-08-10 LAB — HEMOGLOBIN A1C
Hgb A1c MFr Bld: 8.4 % — ABNORMAL HIGH (ref 4.8–5.6)
Mean Plasma Glucose: 194.38 mg/dL

## 2019-08-10 LAB — ABO/RH: ABO/RH(D): A POS

## 2019-08-10 MED ORDER — SODIUM POLYSTYRENE SULFONATE 15 GM/60ML PO SUSP
30.0000 g | Freq: Once | ORAL | Status: AC
  Administered 2019-08-10: 30 g via ORAL
  Filled 2019-08-10: qty 120

## 2019-08-10 MED ORDER — SODIUM CHLORIDE 0.9 % IV SOLN: INTRAVENOUS | Status: DC

## 2019-08-10 MED ORDER — INSULIN ASPART 100 UNIT/ML ~~LOC~~ SOLN
5.0000 [IU] | Freq: Three times a day (TID) | SUBCUTANEOUS | Status: DC
  Administered 2019-08-10 – 2019-08-15 (×14): 5 [IU] via SUBCUTANEOUS

## 2019-08-10 MED ORDER — TECHNETIUM TO 99M ALBUMIN AGGREGATED
1.5000 | Freq: Once | INTRAVENOUS | Status: AC | PRN
  Administered 2019-08-10: 1.5 via INTRAVENOUS

## 2019-08-10 NOTE — Progress Notes (Signed)
Spoke with patients son, Deondra Carlon, and gave and update on status, POC, and discharge planning. Son voiced concerns about who would take care of patient if he is discharged .

## 2019-08-10 NOTE — Progress Notes (Signed)
Patient has arrived on the unit. Admitted to room 5w30. Patient A & O x4. No complaints of pain or discomfort at this time. Ambulated independently with standby assist. Telemetry monitoring placed on patient per order. Patient now resting peacefully in his room. Will continue to monitor.

## 2019-08-10 NOTE — Progress Notes (Signed)
Patient picked up by transport to [facility].

## 2019-08-10 NOTE — Progress Notes (Signed)
Spoke with daughter Otilio Saber, goes by Meryl Crutch); updated her about patient's transfer to [facility] bed [phone]. She expressed understanding.

## 2019-08-10 NOTE — Progress Notes (Signed)
Attempted to call son Elberta Fortis) and daughter Otilio Saber) per patient request to update them about his transfer.. No answer from either, voicemail not set up for son, left voicemail for daughter to call me back.

## 2019-08-10 NOTE — Progress Notes (Addendum)
PROGRESS NOTE [place] [facility]  Lorin Picket.  [id number]  DOB: 01-07-1932  DOA: 08/09/2019 PCP: [name], MD   Brief Admission Hx: 86 y/o male with DM, HTN, CKD, CAD, HTN, depression, presented with generalized weakness and feeling ill for 3 weeks associated with cough and chest congestion.  He has tested covid 19 positive.  He is not requiring oxygen.  He has AKI.  He has been arranged to have a V/Q scan at [facility] (not available at [facility]) and will transfer to have procedure.   MDM/Assessment & Plan:   1. AKI on CKD stage 3 - Prerenal azotemia treating with IV fluids, Holding lisinopril and lasix. Slight improvement with hydration noted.  Continue.  Repeat labs in AM. 2. Hyperkalemia - He has not responded to lokelma.  I have ordered kayexalate x 1 dose, repeat BMP at noon today.   3. Covid 19 infection - no infiltrate seen on CXR.  Pt remains on room air.  Pt has been started on decadron which will be continued.   4. Fever - secondary to Covid infection, treat symptomatically.  5. Dyspnea - likely this is all Covid related but follow up V/Q scan since it was already ordered and patient being transported to have it done to rule out PE.  6. Thrombocytopenia - suspect reactive to viral infection, following, no bleeding reported.  Monitor closely.  7. Type 2 DM - continue to follow CBGs closely, continue lantus and SSI coverage.  8. Essential hypertention - resumed norvasc, carvedilol and doxazosin.  Holding home lasix and lisinopril temporarily.  9. CAD - temporarily held aspirin/plavix with thrombocytopenia, carvedilol restarted.   DVT prophylaxis: SCDs Code Status: Full  Family Communication: patient updated bedside, verbalized understanding  Disposition Plan: from home, likely could return home in next 24 hours if remains stable  Consultants:    Procedures:  V/Q scan pending   Antimicrobials:     Subjective: Pt says he is less SOB today.    Objective: Vitals:     08/10/19 0024 08/10/19 0507 08/10/19 0811 08/10/19 0812  BP:  (!) 131/56 126/76   Pulse: 60 (!) 56  (!) 50  Resp:  (!) 22 20   Temp: 98.9 F (37.2 C) 97.7 F (36.5 C) 97.6 F (36.4 C)   TempSrc: Oral Oral Oral   SpO2: 96% 95% 98%   Weight:      Height:        Intake/Output Summary (Last 24 hours) at 08/10/2019 [id number] Last data filed at 08/10/2019 0500 Gross per 24 hour  Intake 447.84 ml  Output 525 ml  Net -77.16 ml   Filed Weights   08/09/19 1153 08/09/19 1715  Weight: (!) 142.9 kg 118.9 kg   REVIEW OF SYSTEMS  As per history otherwise all reviewed and reported negative  Exam:  General exam: chronically ill appearing male, awake, alert, NAD, not on oxygen. Respiratory system: Clear. No increased work of breathing. Cardiovascular system: normal S1 & S2 heard. No JVD, murmurs, gallops, clicks or pedal edema. Gastrointestinal system: Abdomen is obese, nondistended, soft and nontender. Normal bowel sounds heard. Central nervous system: Alert and oriented. No focal neurological deficits. Extremities: no cyanosis or clubbing.  Data Reviewed: Basic Metabolic Panel: Recent Labs  Lab 08/09/19 1320 08/10/19 0413  NA 139 139  K 5.4* 5.2*  CL 108 111  CO2 22 19*  GLUCOSE 174* 221*  BUN 82* 75*  CREATININE 2.55* 2.16*  CALCIUM 8.2* 7.8*   Liver Function Tests: Recent  Labs  Lab 08/09/19 1320 08/10/19 0413  AST 72* 65*  ALT 26 25  ALKPHOS 63 55  BILITOT 0.9 0.7  PROT 7.7 6.4*  ALBUMIN 3.6 3.0*   No results for input(s): LIPASE, AMYLASE in the last 168 hours. No results for input(s): AMMONIA in the last 168 hours. CBC: Recent Labs  Lab 08/09/19 1320 08/10/19 0413  WBC 5.0 3.2*  NEUTROABS 4.1 2.7  HGB 11.3* 10.1*  HCT 37.1* 32.7*  MCV 93.2 92.1  PLT 83* 82*   Cardiac Enzymes: No results for input(s): CKTOTAL, CKMB, CKMBINDEX, TROPONINI in the last 168 hours. CBG (last 3)  Recent Labs    08/09/19 1710 08/09/19 2118 08/10/19 0731  GLUCAP 136* 196*  213*   Recent Results (from the past 240 hour(s))  Respiratory Panel by RT PCR (Flu A&B, Covid) - Nasopharyngeal Swab     Status: Abnormal   Collection Time: 08/09/19  1:18 PM   Specimen: Nasopharyngeal Swab  Result Value Ref Range Status   SARS Coronavirus 2 by RT PCR POSITIVE (A) NEGATIVE Final    Comment: RESULT CALLED TO, READ BACK BY AND VERIFIED WITH: STEINHAUER,C. AT [id number] ON 08/09/2019 BY BAUGHAM,M. (NOTE) SARS-CoV-2 target nucleic acids are DETECTED. SARS-CoV-2 RNA is generally detectable in upper respiratory specimens  during the acute phase of infection. Positive results are indicative of the presence of the identified virus, but do not rule out bacterial infection or co-infection with other pathogens not detected by the test. Clinical correlation with patient history and other diagnostic information is necessary to determine patient infection status. The expected result is Negative. Fact Sheet for Patients:  PinkCheek.be Fact Sheet for Healthcare Providers: GravelBags.it This test is not yet approved or cleared by the [place] FDA and  has been authorized for detection and/or diagnosis of SARS-CoV-2 by FDA under an Emergency Use Authorization (EUA).  This EUA will remain in effect (meaning this test  can be used) for the duration of  the COVID-19 declaration under Section 564(b)(1) of the Act, 21 U.S.C. section 360bbb-3(b)(1), unless the authorization is terminated or revoked sooner.    Influenza A by PCR NEGATIVE NEGATIVE Final   Influenza B by PCR NEGATIVE NEGATIVE Final    Comment: (NOTE) The Xpert Xpress SARS-CoV-2/FLU/RSV assay is intended as an aid in  the diagnosis of influenza from Nasopharyngeal swab specimens and  should not be used as a sole basis for treatment. Nasal washings and  aspirates are unacceptable for Xpert Xpress SARS-CoV-2/FLU/RSV  testing. Fact Sheet for  Patients: PinkCheek.be Fact Sheet for Healthcare Providers: GravelBags.it This test is not yet approved or cleared by the [place] FDA and  has been authorized for detection and/or diagnosis of SARS-CoV-2 by  FDA under an Emergency Use Authorization (EUA). This EUA will remain  in effect (meaning this test can be used) for the duration of the  Covid-19 declaration under Section 564(b)(1) of the Act, 21  U.S.C. section 360bbb-3(b)(1), unless the authorization is  terminated or revoked. Performed at [facility], [address] [place], [place] [address]     Studies: Portable chest 1 View  Result Date: 08/10/2019 CLINICAL DATA:  Shortness of breath at night for 3-4 weeks EXAM: PORTABLE CHEST 1 VIEW COMPARISON:  Yesterday FINDINGS: Chronic elevation of the right diaphragm with streaky and hazy pulmonary density at the right base. Comparatively clear left lung. Normal heart size for technique. Atherosclerosis of the aorta. IMPRESSION: Chronic elevation of the right diaphragm with right base pulmonary opacity that could  reflect scarring, atelectasis, or infection. Electronically Signed   By: Monte Fantasia M.D.   On: 08/10/2019 07:36   DG Chest Portable 1 View  Result Date: 08/09/2019 CLINICAL DATA:  Weakness, short of breath, lower extremity edema EXAM: PORTABLE CHEST 1 VIEW COMPARISON:  02/08/2015 FINDINGS: Single frontal view of the chest demonstrates an unremarkable cardiac silhouette. Likely subsegmental atelectasis right upper lobe abutting the minor fissure. No acute airspace disease, effusion, or pneumothorax. Hypertrophic changes overlying the anterior right first rib unchanged. Colonic interposition right upper quadrant. IMPRESSION: 1. Likely subsegmental atelectasis right upper lobe. 2. No acute intrathoracic process. Electronically Signed   By: Randa Ngo M.D.   On: 08/09/2019 12:34   Scheduled Meds: . amLODipine  5 mg  Oral Daily  . vitamin C  500 mg Oral Daily  . atorvastatin  80 mg Oral Daily  . carvedilol  12.5 mg Oral BID WC  . dexamethasone (DECADRON) injection  6 mg Intravenous Q24H  . doxazosin  2 mg Oral Daily  . insulin aspart  0-5 Units Subcutaneous QHS  . insulin aspart  0-9 Units Subcutaneous TID WC  . insulin glargine  25 Units Subcutaneous QHS  . levothyroxine  100 mcg Oral QAC breakfast  . zinc sulfate  220 mg Oral Daily   Continuous Infusions:  Principal Problem:   AKI (acute kidney injury) ([place]) Active Problems:   Type 2 diabetes mellitus with stage 3 chronic kidney disease, with long-term current use of insulin (HCC)   Essential hypertension, benign   Coronary atherosclerosis of native coronary artery   CKD (chronic kidney disease), stage III   COVID-19 virus infection  Time spent:   Irwin Brakeman, MD [facility] 08/10/2019, 8:52 AM    LOS: 0 days  How to contact the [facility] Attending or Consulting provider 7A - 7P or covering provider during after hours [place], for this patient?  1. Check the care team in [facility] and look for a) attending/consulting TRH provider listed and b) the [facility] team listed 2. Log into [email or web address] and use [place]'s universal password to access. If you do not have the password, please contact the hospital operator. 3. Locate the [facility] provider you are looking for under [facility] and page to a number that you can be directly reached. 4. If you still have difficulty reaching the provider, please page the [facility] (Director on Call) for the Hospitalists listed on amion for assistance.

## 2019-08-10 NOTE — Progress Notes (Signed)
Report given to [place], Therapist, sports at [employer].

## 2019-08-11 LAB — CBC WITH DIFFERENTIAL/PLATELET
Abs Immature Granulocytes: 0.04 10*3/uL (ref 0.00–0.07)
Basophils Absolute: 0 10*3/uL (ref 0.0–0.1)
Basophils Relative: 0 %
Eosinophils Absolute: 0 10*3/uL (ref 0.0–0.5)
Eosinophils Relative: 0 %
HCT: 31.5 % — ABNORMAL LOW (ref 39.0–52.0)
Hemoglobin: 9.9 g/dL — ABNORMAL LOW (ref 13.0–17.0)
Immature Granulocytes: 0 %
Lymphocytes Relative: 5 %
Lymphs Abs: 0.4 10*3/uL — ABNORMAL LOW (ref 0.7–4.0)
MCH: 28.4 pg (ref 26.0–34.0)
MCHC: 31.4 g/dL (ref 30.0–36.0)
MCV: 90.3 fL (ref 80.0–100.0)
Monocytes Absolute: 0.5 10*3/uL (ref 0.1–1.0)
Monocytes Relative: 5 %
Neutro Abs: 8.3 10*3/uL — ABNORMAL HIGH (ref 1.7–7.7)
Neutrophils Relative %: 90 %
Platelets: 99 10*3/uL — ABNORMAL LOW (ref 150–400)
RBC: 3.49 MIL/uL — ABNORMAL LOW (ref 4.22–5.81)
RDW: 15.4 % (ref 11.5–15.5)
WBC: 9.2 10*3/uL (ref 4.0–10.5)
nRBC: 0 % (ref 0.0–0.2)

## 2019-08-11 LAB — COMPREHENSIVE METABOLIC PANEL
ALT: 30 U/L (ref 0–44)
AST: 70 U/L — ABNORMAL HIGH (ref 15–41)
Albumin: 2.8 g/dL — ABNORMAL LOW (ref 3.5–5.0)
Alkaline Phosphatase: 54 U/L (ref 38–126)
Anion gap: 10 (ref 5–15)
BUN: 75 mg/dL — ABNORMAL HIGH (ref 8–23)
CO2: 19 mmol/L — ABNORMAL LOW (ref 22–32)
Calcium: 8 mg/dL — ABNORMAL LOW (ref 8.9–10.3)
Chloride: 113 mmol/L — ABNORMAL HIGH (ref 98–111)
Creatinine, Ser: 2.14 mg/dL — ABNORMAL HIGH (ref 0.61–1.24)
GFR calc Af Amer: 32 mL/min — ABNORMAL LOW (ref >60)
GFR calc non Af Amer: 27 mL/min — ABNORMAL LOW (ref >60)
Glucose, Bld: 189 mg/dL — ABNORMAL HIGH (ref 70–99)
Potassium: 4.5 mmol/L (ref 3.5–5.1)
Sodium: 142 mmol/L (ref 135–145)
Total Bilirubin: 0.5 mg/dL (ref 0.3–1.2)
Total Protein: 6.2 g/dL — ABNORMAL LOW (ref 6.5–8.1)

## 2019-08-11 LAB — C-REACTIVE PROTEIN: CRP: 5.5 mg/dL — ABNORMAL HIGH (ref <1.0)

## 2019-08-11 LAB — FERRITIN: Ferritin: 266 ng/mL (ref 24–336)

## 2019-08-11 LAB — GLUCOSE, CAPILLARY
Glucose-Capillary: 136 mg/dL — ABNORMAL HIGH (ref 70–99)
Glucose-Capillary: 127 mg/dL — ABNORMAL HIGH (ref 70–99)
Glucose-Capillary: 148 mg/dL — ABNORMAL HIGH (ref 70–99)
Glucose-Capillary: 208 mg/dL — ABNORMAL HIGH (ref 70–99)

## 2019-08-11 LAB — D-DIMER, QUANTITATIVE: D-Dimer, Quant: 3.34 ug/mL-FEU — ABNORMAL HIGH (ref 0.00–0.50)

## 2019-08-11 NOTE — Plan of Care (Signed)

## 2019-08-11 NOTE — Progress Notes (Signed)
OT Cancellation Note  Patient Details Name: Paul Wood. MRN: [id number] DOB: [birth year]   Cancelled Treatment:    Reason Eval/Treat Not Completed: Medical issues which prohibited therapy. D-dimer elevated. Documented swelling at LE. MD ordered V/Q scan. Will hold therapy and return as schedule allows.   [place], OTR/L Acute Rehab Pager: [phone] Office: [phone] 08/11/2019, 3:04 PM

## 2019-08-11 NOTE — Progress Notes (Signed)
PROGRESS NOTE  Paul Wood. [id number] DOB: 10-23-1931 DOA: 08/09/2019 PCP: [name], MD  Brief History   86 y/o male with DM, HTN, CKD, CAD, HTN, depression, presented with generalized weakness and feeling ill for 3 weeks associated with cough and chest congestion.  He has tested covid 19 positive.  He is not requiring oxygen.  He has AKI.  He has been arranged to have a V/Q scan at [facility] (not available at [facility]) and was transferred to [facility] have procedure.   VQ was negative for Pulmonary embolus.  Consultants  . None  Procedures  . VQ scan negative  Antibiotics   Anti-infectives (From admission, onward)   None    .  Subjective  The patient is resting comfortably. No new complaints.  Objective   Vitals:  Vitals:   08/11/19 1100 08/11/19 1400  BP:    Pulse:    Resp:  (!) 24  Temp: 98.9 F (37.2 C)   SpO2:     Exam:  Constitutional:  . The patient is awake, alert, and oriented x 3. He is elderly, weak, and frail. No acute distress. Respiratory:  . No increased work of breathing. . No wheezes, rales, or rhonchi . No tactile fremitus Cardiovascular:  . Regular rate and rhythm . No murmurs, ectopy, or gallups. . No lateral PMI. No thrills. Abdomen:  . Abdomen is soft, non-tender, non-distended . No hernias, masses, or organomegaly . Normoactive bowel sounds.  Musculoskeletal:  . No cyanosis, clubbing . There is swelling noted in the lower extremities Skin:  . No rashes, lesions, ulcers . palpation of skin: no induration or nodules Neurologic:  . CN 2-12 intact . Sensation all 4 extremities intact Psychiatric:  . Mental status o Mood, affect appropriate o Orientation to person, place, time  . judgment and insight appear intact  I have personally reviewed the following:   Today's Data  . Vitals, CMP, CBC, Ferritin, CRP, D Dimer, procalcitonin  Imaging  . VQ scan  Scheduled Meds: . amLODipine  5 mg Oral Daily  . vitamin C  500 mg Oral  Daily  . atorvastatin  80 mg Oral Daily  . carvedilol  12.5 mg Oral BID WC  . dexamethasone (DECADRON) injection  6 mg Intravenous Q24H  . doxazosin  2 mg Oral Daily  . insulin aspart  0-5 Units Subcutaneous QHS  . insulin aspart  0-9 Units Subcutaneous TID WC  . insulin aspart  5 Units Subcutaneous TID WC  . insulin glargine  25 Units Subcutaneous QHS  . levothyroxine  100 mcg Oral QAC breakfast  . zinc sulfate  220 mg Oral Daily   Continuous Infusions: . sodium chloride 50 mL/hr at 08/11/19 [id number]    Principal Problem:   AKI (acute kidney injury) ([place]) Active Problems:   Type 2 diabetes mellitus with stage 3 chronic kidney disease, with long-term current use of insulin (HCC)   Essential hypertension, benign   Coronary atherosclerosis of native coronary artery   CKD (chronic kidney disease), stage III   COVID-19 virus infection   Hyperkalemia   LOS: 1 day   A & P   AKI on CKD stage 3 - Prerenal azotemia treating with IV fluids, Holding lisinopril and lasix. Creatinine is 2.14 today, only minimally improved from 2.16 yesterday. Vaseline creatinine 1.6-1.7.  Hyperkalemia: Resolved. Monitor.   Covid 19 infection: There was no infiltrate seen on CXR.  Pt remains on room air.  Pt has been started on decadron which will be  continued.  CRP is coming down, although other inflammatory markers remain elevated.   Pulmonary embolus: VQ scan was negative. Will check bilateral lower extremity dopplers.  Fever: Secondary to Covid infection. No fever for last 24 hours.  Dyspnea: Subjective. The patient is saturating in the mid-low nineties on room air. Will check ambulatory Sao2.  CXR 08/10/2019 negative for infiltrates.   Thrombocytopenia: Likely due to COVID-19 infection. Slightly improved with platelets of 99 today. Monitor.  Type 2 DM: The patient is continue to follow CBGs closely, continue lantus and SSI coverage.   Essential hypertension: Resumed norvasc, carvedilol and doxazosin.   Holding home lasix and lisinopril temporarily.   CAD: Temporarily held aspirin/plavix with thrombocytopenia, carvedilol restarted.   I have examined and seen this patient myself. I have spent 34 minutes in his evaluation and care.  DVT prophylaxis: SCDs Code Status: Full  Family Communication: patient updated bedside, verbalized understanding  Disposition Plan: from home. Plan is for him to return to home with [facility] PT/OT. Except that son has called and expressed concern for who would care for his father on discharge. Will consult TOC.  Kyrsten Deleeuw, DO [facility] Direct contact: see [email or web address]  7PM-7AM contact night coverage as above 08/11/2019, 4:15 PM  LOS: 1 day

## 2019-08-11 NOTE — Progress Notes (Signed)
PT Cancellation Note  Patient Details Name: Paul Wood. MRN: [id number] DOB: 17-Apr-1932   Cancelled Treatment:    Reason Eval/Treat Not Completed: Patient not medically ready  Noted pt with elevated D-dimer, documented lower extremity edema,  and V/Q scan is pending. Will await results prior to progressing activity.    Arby Barrette, PT Pager [phone]   Rexanne Mano 08/11/2019, 3:03 PM

## 2019-08-12 ENCOUNTER — Inpatient Hospital Stay (HOSPITAL_COMMUNITY): Payer: Medicare HMO

## 2019-08-12 LAB — COMPREHENSIVE METABOLIC PANEL
ALT: 33 U/L (ref 0–44)
AST: 61 U/L — ABNORMAL HIGH (ref 15–41)
Albumin: 2.8 g/dL — ABNORMAL LOW (ref 3.5–5.0)
Alkaline Phosphatase: 57 U/L (ref 38–126)
Anion gap: 10 (ref 5–15)
BUN: 63 mg/dL — ABNORMAL HIGH (ref 8–23)
CO2: 19 mmol/L — ABNORMAL LOW (ref 22–32)
Calcium: 8.3 mg/dL — ABNORMAL LOW (ref 8.9–10.3)
Chloride: 113 mmol/L — ABNORMAL HIGH (ref 98–111)
Creatinine, Ser: 1.83 mg/dL — ABNORMAL HIGH (ref 0.61–1.24)
GFR calc Af Amer: 38 mL/min — ABNORMAL LOW (ref >60)
GFR calc non Af Amer: 33 mL/min — ABNORMAL LOW (ref >60)
Glucose, Bld: 158 mg/dL — ABNORMAL HIGH (ref 70–99)
Potassium: 4.6 mmol/L (ref 3.5–5.1)
Sodium: 142 mmol/L (ref 135–145)
Total Bilirubin: 0.9 mg/dL (ref 0.3–1.2)
Total Protein: 6.3 g/dL — ABNORMAL LOW (ref 6.5–8.1)

## 2019-08-12 LAB — CBC WITH DIFFERENTIAL/PLATELET
Abs Immature Granulocytes: 0.09 10*3/uL — ABNORMAL HIGH (ref 0.00–0.07)
Basophils Absolute: 0 10*3/uL (ref 0.0–0.1)
Basophils Relative: 0 %
Eosinophils Absolute: 0 10*3/uL (ref 0.0–0.5)
Eosinophils Relative: 0 %
HCT: 32.5 % — ABNORMAL LOW (ref 39.0–52.0)
Hemoglobin: 10.3 g/dL — ABNORMAL LOW (ref 13.0–17.0)
Immature Granulocytes: 1 %
Lymphocytes Relative: 5 %
Lymphs Abs: 0.6 10*3/uL — ABNORMAL LOW (ref 0.7–4.0)
MCH: 28.3 pg (ref 26.0–34.0)
MCHC: 31.7 g/dL (ref 30.0–36.0)
MCV: 89.3 fL (ref 80.0–100.0)
Monocytes Absolute: 0.5 10*3/uL (ref 0.1–1.0)
Monocytes Relative: 3 %
Neutro Abs: 12.2 10*3/uL — ABNORMAL HIGH (ref 1.7–7.7)
Neutrophils Relative %: 91 %
Platelets: 118 10*3/uL — ABNORMAL LOW (ref 150–400)
RBC: 3.64 MIL/uL — ABNORMAL LOW (ref 4.22–5.81)
RDW: 15.4 % (ref 11.5–15.5)
WBC: 13.4 10*3/uL — ABNORMAL HIGH (ref 4.0–10.5)
nRBC: 0.1 % (ref 0.0–0.2)

## 2019-08-12 LAB — GLUCOSE, CAPILLARY
Glucose-Capillary: 130 mg/dL — ABNORMAL HIGH (ref 70–99)
Glucose-Capillary: 181 mg/dL — ABNORMAL HIGH (ref 70–99)
Glucose-Capillary: 257 mg/dL — ABNORMAL HIGH (ref 70–99)
Glucose-Capillary: 244 mg/dL — ABNORMAL HIGH (ref 70–99)

## 2019-08-12 LAB — PROCALCITONIN: Procalcitonin: <0.1 ng/mL

## 2019-08-12 LAB — BLOOD GAS, ARTERIAL
Acid-base deficit: 4.6 mmol/L — ABNORMAL HIGH (ref 0.0–2.0)
Bicarbonate: 18.6 mmol/L — ABNORMAL LOW (ref 20.0–28.0)
FIO2: 28
O2 Saturation: 92.7 %
Patient temperature: 39
pCO2 arterial: 29.5 mmHg — ABNORMAL LOW (ref 32.0–48.0)
pH, Arterial: 7.426 (ref 7.350–7.450)
pO2, Arterial: 74.3 mmHg — ABNORMAL LOW (ref 83.0–108.0)

## 2019-08-12 LAB — D-DIMER, QUANTITATIVE: D-Dimer, Quant: 4.22 ug/mL-FEU — ABNORMAL HIGH (ref 0.00–0.50)

## 2019-08-12 LAB — C-REACTIVE PROTEIN: CRP: 6.8 mg/dL — ABNORMAL HIGH (ref <1.0)

## 2019-08-12 LAB — FERRITIN: Ferritin: 271 ng/mL (ref 24–336)

## 2019-08-12 NOTE — Progress Notes (Signed)
PROGRESS NOTE  Paul Wood. [id number] DOB: September 26, 1931 DOA: 08/09/2019 PCP: [name], MD  Brief History   86 y/o male with DM, HTN, CKD, CAD, HTN, depression, presented with generalized weakness and feeling ill for 3 weeks associated with cough and chest congestion.  He has tested covid 19 positive.  He is not requiring oxygen.  He has AKI.  He has been arranged to have a V/Q scan at [facility] (not available at [facility]) and was transferred to [facility] have procedure.   VQ was negative for Pulmonary embolus.  Consultants  . None  Procedures  . VQ scan negative  Antibiotics   Anti-infectives (From admission, onward)   None     Subjective  The patient is resting comfortably. He did have a temperature of 102.2, hypotension, elevated respiratory rate this morning.   Objective   Vitals:  Vitals:   08/12/19 1600 08/12/19 1846  BP:  (!) 131/56  Pulse:  66  Resp: 19   Temp:    SpO2: 97%    Exam:  Constitutional:  . The patient is awake, alert, and oriented x 3. He is elderly, weak, and frail. No acute distress. Respiratory:  . No increased work of breathing. . No wheezes, rales, or rhonchi . No tactile fremitus Cardiovascular:  . Regular rate and rhythm . No murmurs, ectopy, or gallups. . No lateral PMI. No thrills. Abdomen:  . Abdomen is soft, non-tender, non-distended . No hernias, masses, or organomegaly . Normoactive bowel sounds.  Musculoskeletal:  . No cyanosis, clubbing . There is swelling noted in the lower extremities Skin:  . No rashes, lesions, ulcers . palpation of skin: no induration or nodules Neurologic:  . CN 2-12 intact . Sensation all 4 extremities intact Psychiatric:  . Mental status o Mood, affect appropriate o Orientation to person, place, time  . judgment and insight appear intact  I have personally reviewed the following:   Today's Data  . Vitals, ABG, CMP, Ferritin, procalcitonin, and D Dimer  Imaging  . VQ  scan . CXR  Scheduled Meds: . vitamin C  500 mg Oral Daily  . atorvastatin  80 mg Oral Daily  . carvedilol  12.5 mg Oral BID WC  . dexamethasone (DECADRON) injection  6 mg Intravenous Q24H  . doxazosin  2 mg Oral Daily  . insulin aspart  0-5 Units Subcutaneous QHS  . insulin aspart  0-9 Units Subcutaneous TID WC  . insulin aspart  5 Units Subcutaneous TID WC  . insulin glargine  25 Units Subcutaneous QHS  . levothyroxine  100 mcg Oral QAC breakfast  . zinc sulfate  220 mg Oral Daily   Continuous Infusions: . sodium chloride 50 mL/hr at 08/12/19 1446    Principal Problem:   AKI (acute kidney injury) ([place]) Active Problems:   Type 2 diabetes mellitus with stage 3 chronic kidney disease, with long-term current use of insulin (HCC)   Essential hypertension, benign   Coronary atherosclerosis of native coronary artery   CKD (chronic kidney disease), stage III   COVID-19 virus infection   Hyperkalemia   LOS: 2 days   A & P   AKI on CKD stage 3 - Prerenal azotemia treating with IV fluids, Holding lisinopril and lasix. Creatinine is 1.83 today,  improved from 2.16 yesterday. Baseline creatinine 1.6-1.7.  Hyperkalemia: Resolved. Monitor.   Covid 19 infection: There was no infiltrate seen on CXR.  Pt remains on room air.  Pt has been started on decadron which will be  continued.  CRP is coming down, although other inflammatory markers remain elevated.  CXR performed today is stable.  Pulmonary embolus: VQ scan was negative. Will check bilateral lower extremity dopplers.  Fever: Secondary to Covid infection. Temp of 102.2 this am resolved with tylenol.  Dyspnea: Subjective. The patient is saturating in the mid-low nineties on room air. Will check ambulatory Sao2.  CXR 08/10/2019 negative for infiltrates.   Thrombocytopenia: Likely due to COVID-19 infection. Slightly improved with platelets of 118 today. Monitor.  Type 2 DM: The patient is continue to follow CBGs closely, continue  lantus and SSI coverage. FSBS has ranged from 181 to 257 in the last 24 hours.  Essential hypertension: Resumed norvasc, carvedilol and doxazosin.  Holding home lasix and lisinopril temporarily.   CAD: Temporarily held aspirin/plavix with thrombocytopenia, carvedilol restarted.   I have examined and seen this patient myself. I have spent 30 minutes in his evaluation and care.  DVT prophylaxis: SCDs Code Status: Full  Family Communication: patient updated bedside, verbalized understanding  Disposition Plan: Pt is from home. Plan is for him to return to home with [facility] PT/OT. Except that son has called and expressed concern for who would care for his father on discharge. Will consult TOC for possible placement.   Mckyle Solanki, DO [facility] Direct contact: see [email or web address]  7PM-7AM contact night coverage as above 08/12/2019, 8:04 PM  LOS: 1 day

## 2019-08-12 NOTE — Evaluation (Signed)
Occupational Therapy Evaluation Patient Details Name: Paul Wood. MRN: [id number] DOB: 02-Jul-1932 Today's Date: 08/12/2019    History of Present Illness 86 yo male present to ED with difficulty breathing and cough. VQ was negative for Pulmonary embolus. PMH including DM, HTN, CAD, CKD3, HTN, and depression.   Clinical Impression   PTA, pt was living alone and was performing ADLs, light IADLs, and driving; using cane for functional mobility.Pt currently requiring Supervision-Min Guard A for safety and balance. Pt presenting with decreased strength, balance, and cognition. Pt requiring increased time and cues for problem solving and awareness.  VSS throughout on RA. Pt would benefit from further acute OT to facilitate safe dc. Will need to confirm support and supervision from family, recommend dc to home with [place] for further OT to optimize safety, independence with ADLs, and return to PLOF.      Follow Up Recommendations  Home health OT;Supervision/Assistance - 24 hour    Equipment Recommendations  None recommended by OT    Recommendations for Other Services PT consult     Precautions / Restrictions Precautions Precautions: Fall      Mobility Bed Mobility Overal bed mobility: Needs Assistance Bed Mobility: Sit to Supine       Sit to supine: Supervision   General bed mobility comments: Increased time  Transfers Overall transfer level: Needs assistance Equipment used: Rolling walker (2 wheeled) Transfers: Sit to/from Stand Sit to Stand: Min guard         General transfer comment: Min Guard A for safety. cues for hand placement    Balance Overall balance assessment: Needs assistance Sitting-balance support: No upper extremity supported;Feet supported Sitting balance-Leahy Scale: Good     Standing balance support: No upper extremity supported;During functional activity Standing balance-Leahy Scale: Fair Standing balance comment: Able to stand at sink without UE  to perform oral care                           ADL either performed or assessed with clinical judgement   ADL Overall ADL's : Needs assistance/impaired Eating/Feeding: Set up;Sitting   Grooming: Oral care;Min guard;Standing   Upper Body Bathing: Set up;Supervision/ safety;Sitting   Lower Body Bathing: Min guard;Sit to/from stand   Upper Body Dressing : Set up;Supervision/safety;Sitting Upper Body Dressing Details (indicate cue type and reason): donned second gown like jacket Lower Body Dressing: Min guard;Sit to/from stand Lower Body Dressing Details (indicate cue type and reason): Min Guard A for safety. Pt able to reach/bend forward and adjust socks Toilet Transfer: Min guard;Ambulation;RW(simulated to recliner) Toilet Transfer Details (indicate cue type and reason): Min Guard A for safety. cues for hand placement         Functional mobility during ADLs: Min guard;Rolling walker General ADL Comments: Pt presenting with decreased strength, balance, and cognition. Pt safety mainly limited by cognition with poor safety awareness and problem solving     Vision Baseline Vision/History: Wears glasses Patient Visual Report: No change from baseline       Perception     Praxis      Pertinent Vitals/Pain Pain Assessment: Faces Faces Pain Scale: No hurt Pain Intervention(s): Monitored during session     Hand Dominance Right   Extremity/Trunk Assessment Upper Extremity Assessment Upper Extremity Assessment: Generalized weakness   Lower Extremity Assessment Lower Extremity Assessment: Defer to PT evaluation   Cervical / Trunk Assessment Cervical / Trunk Assessment: Kyphotic   Communication Communication Communication: Receptive difficulties;Expressive difficulties  Cognition Arousal/Alertness: Awake/alert;Lethargic(lethargic upon arrival) Behavior During Therapy: Flat affect;Impulsive Overall Cognitive Status: No family/caregiver present to determine  baseline cognitive functioning Area of Impairment: Attention;Memory;Following commands;Safety/judgement;Awareness;Problem solving                   Current Attention Level: Sustained Memory: Decreased short-term memory Following Commands: Follows one step commands inconsistently;Follows one step commands with increased time Safety/Judgement: Decreased awareness of safety;Decreased awareness of deficits Awareness: Emergent;Intellectual Problem Solving: Slow processing;Requires verbal cues     General Comments  SpO2 90s on RA throughout. HR elevating into 158 during oral care; but poor reading. HR 70s during mobility.     Exercises     Shoulder Instructions      Home Living Family/patient expects to be discharged to:: Private residence Living Arrangements: Alone Available Help at Discharge: Family Type of Home: House Home Access: Level entry     Home Layout: Two level;Able to live on main level with bedroom/bathroom Alternate Level Stairs-Number of Steps: flight   Bathroom Shower/Tub: Teacher, early years/pre: Standard     Home Equipment: Cane - single point;Walker - 2 wheels;Shower seat;Grab bars - tub/shower;Hand held shower head          Prior Functioning/Environment Level of Independence: Independent with assistive device(s)        Comments: Pt reports he performs BADLs, light IADLs, and drives. States thats his children assist with IADLs including cooking and cleaning. Reports he uses a cane for functional mobility        OT Problem List: Decreased strength;Decreased range of motion;Decreased activity tolerance;Impaired balance (sitting and/or standing);Decreased knowledge of use of DME or AE;Decreased knowledge of precautions;Impaired UE functional use      OT Treatment/Interventions: Self-care/ADL training;Therapeutic exercise;Energy conservation;DME and/or AE instruction;Therapeutic activities;Patient/family education    OT Goals(Current  goals can be found in the care plan section) Acute Rehab OT Goals Patient Stated Goal: "Go home" OT Goal Formulation: With patient Time For Goal Achievement: 08/26/19 Potential to Achieve Goals: Good  OT Frequency: Min 3X/week   Barriers to D/C: Decreased caregiver support  unsure of support at home as pt reports he lives alone       Co-evaluation              AM-PAC OT "6 Clicks" Daily Activity     Outcome Measure Help from another person eating meals?: None Help from another person taking care of personal grooming?: A Little Help from another person toileting, which includes using toliet, bedpan, or urinal?: A Little Help from another person bathing (including washing, rinsing, drying)?: A Little Help from another person to put on and taking off regular upper body clothing?: A Little Help from another person to put on and taking off regular lower body clothing?: A Little 6 Click Score: 19   End of Session Equipment Utilized During Treatment: Gait belt;Rolling walker Nurse Communication: Mobility status  Activity Tolerance: Patient tolerated treatment well Patient left: in chair;with call bell/phone within reach  OT Visit Diagnosis: Unsteadiness on feet (R26.81);Other abnormalities of gait and mobility (R26.89);Muscle weakness (generalized) (M62.81)                Time: [id number] OT Time Calculation (min): 26 min Charges:  OT General Charges $OT Visit: 1 Visit OT Evaluation $OT Eval Moderate Complexity: [place], OTR/L Acute Rehab Pager: [phone] Office: [place] 08/12/2019, 1:48 PM

## 2019-08-12 NOTE — Progress Notes (Addendum)
Secure chat sent to Dr. Benny Lennert:  Good morning Dr. Benny Lennert, Mr. Orlandi concerns me.  He is just very lethargic, not eating a lot, his white count went up today.  I'm afraid his bp meds are dropping his hr and bp too much and that he may be developing pneumonia as evidenced by difficulty taking deep breaths despite his trying really hard.   his temp is 102.2, resp 30, 91 sat on RA.

## 2019-08-12 NOTE — Evaluation (Signed)
Physical Therapy Evaluation Patient Details Name: Paul Wood. MRN: [id number] DOB: 10-01-1931 Today's Date: 08/12/2019   History of Present Illness  86 yo male present to ED with difficulty breathing and cough. VQ was negative for Pulmonary embolus. PMH including DM, HTN, CAD, CKD3, HTN, and depression.  Clinical Impression  Pt admitted with above diagnosis.PTA, pt was living alone and was performing ADLs, light IADLs, and driving; using cane for functional mobility.Pt currently requiring Supervision-Min Guard A for safety and balance. Pt presenting with decreased strength, balance, and cognition. Pt requiring increased time and cues for problem solving and awareness.  VSS throughout on RA. Pt would benefit from further acute PT to facilitate safe dc. Will need to confirm support and supervision from family, recommend dc to home with HHPT.  Pt currently with functional limitations due to the deficits listed below (see PT Problem List). Pt will benefit from skilled PT to increase their independence and safety with mobility to allow discharge to the venue listed below.      Follow Up Recommendations Home health PT(Need to determine family support)    Equipment Recommendations  None recommended by PT    Recommendations for Other Services       Precautions / Restrictions Precautions Precautions: Fall Restrictions Weight Bearing Restrictions: No      Mobility  Bed Mobility Overal bed mobility: Needs Assistance Bed Mobility: Sit to Supine       Sit to supine: Supervision   General bed mobility comments: Increased time  Transfers Overall transfer level: Needs assistance Equipment used: Rolling walker (2 wheeled) Transfers: Sit to/from Stand Sit to Stand: Min guard         General transfer comment: Min Guard A for safety. cues for hand placement  Ambulation/Gait Ambulation/Gait assistance: Min guard Gait Distance (Feet): 150 Feet Assistive device: Rolling walker (2  wheeled) Gait Pattern/deviations: Step-through pattern;Decreased stride length   Gait velocity interpretation: <1.31 ft/sec, indicative of household ambulator General Gait Details: Pt ambulated well with RW.    Stairs            Wheelchair Mobility    Modified Rankin (Stroke Patients Only)       Balance Overall balance assessment: Needs assistance Sitting-balance support: No upper extremity supported;Feet supported Sitting balance-Leahy Scale: Good     Standing balance support: No upper extremity supported;During functional activity Standing balance-Leahy Scale: Fair Standing balance comment: Able to stand at sink without UE to perform oral care                             Pertinent Vitals/Pain Pain Assessment: Faces Faces Pain Scale: No hurt Pain Intervention(s): Limited activity within patient's tolerance;Monitored during session;Repositioned    Home Living Family/patient expects to be discharged to:: Private residence Living Arrangements: Alone Available Help at Discharge: Family Type of Home: House Home Access: Level entry     Home Layout: Two level;Able to live on main level with bedroom/bathroom Home Equipment: Kasandra Knudsen - single point;Walker - 2 wheels;Shower seat;Grab bars - tub/shower;Hand held shower head      Prior Function Level of Independence: Independent with assistive device(s)         Comments: Pt reports he performs BADLs, light IADLs, and drives. States thats his children assist with IADLs including cooking and cleaning. Reports he uses a cane for functional mobility     Hand Dominance   Dominant Hand: Right    Extremity/Trunk Assessment   Upper Extremity  Assessment Upper Extremity Assessment: Defer to OT evaluation    Lower Extremity Assessment Lower Extremity Assessment: Generalized weakness    Cervical / Trunk Assessment Cervical / Trunk Assessment: Kyphotic  Communication   Communication: Receptive  difficulties;Expressive difficulties;HOH  Cognition Arousal/Alertness: Awake/alert;Lethargic(lethargic upon arrival) Behavior During Therapy: Flat affect;Impulsive Overall Cognitive Status: No family/caregiver present to determine baseline cognitive functioning Area of Impairment: Attention;Memory;Following commands;Safety/judgement;Awareness;Problem solving                   Current Attention Level: Sustained Memory: Decreased short-term memory Following Commands: Follows one step commands inconsistently;Follows one step commands with increased time Safety/Judgement: Decreased awareness of safety;Decreased awareness of deficits Awareness: Emergent;Intellectual Problem Solving: Slow processing;Requires verbal cues General Comments: Upon arrival, pt havign just transfered from recliner to EOB and was sitting on all his lines. Pt requiring increased time and cues thorughout session. Pt staring blankly at therapist during conversation.       General Comments General comments (skin integrity, edema, etc.): SpO2 90s on RA throughout. HR elevating into 158 during oral care; but poor reading. HR 70s during mobility.     Exercises     Assessment/Plan    PT Assessment Patient needs continued PT services  PT Problem List Decreased activity tolerance;Decreased balance;Decreased mobility;Decreased knowledge of use of DME;Decreased safety awareness;Decreased knowledge of precautions;Cardiopulmonary status limiting activity       PT Treatment Interventions DME instruction;Gait training;Therapeutic activities;Functional mobility training;Therapeutic exercise;Balance training;Patient/family education    PT Goals (Current goals can be found in the Care Plan section)  Acute Rehab PT Goals Patient Stated Goal: "Go home" PT Goal Formulation: With patient Time For Goal Achievement: 08/26/19 Potential to Achieve Goals: Good    Frequency Min 3X/week   Barriers to discharge         Co-evaluation               AM-PAC PT "6 Clicks" Mobility  Outcome Measure Help needed turning from your back to your side while in a flat bed without using bedrails?: None Help needed moving from lying on your back to sitting on the side of a flat bed without using bedrails?: None Help needed moving to and from a bed to a chair (including a wheelchair)?: A Little Help needed standing up from a chair using your arms (e.g., wheelchair or bedside chair)?: A Little Help needed to walk in hospital room?: A Little Help needed climbing 3-5 steps with a railing? : A Little 6 Click Score: 20    End of Session Equipment Utilized During Treatment: Gait belt Activity Tolerance: Patient limited by fatigue Patient left: in chair;with call bell/phone within reach;with chair alarm set Nurse Communication: Mobility status PT Visit Diagnosis: Muscle weakness (generalized) (M62.81)    Time: [id number] PT Time Calculation (min) (ACUTE ONLY): 26 min   Charges:   PT Evaluation $PT Eval Moderate Complexity: [place] W,PT Acute Rehabilitation Services Pager:  [phone]  Office:  [phone]    Denice Paradise 08/12/2019, 2:24 PM

## 2019-08-12 NOTE — Progress Notes (Signed)
Spoke to [place], pt's daughter and main contact.  She said she will be our family contact.   I told her his white count was up, possible congestion in both lungs, & fever today.  Tylenol took care of fever and he feels better now.   I explained that we try to talk with family 1 x day and that yesterday his phone was ringing off the hook.  She said she would talk to her family and limit calls so he can rest.

## 2019-08-12 NOTE — Progress Notes (Signed)
Left a message on Laverne's home phone, per her request, updating her on patient's condition.    cxr doesn't show any changes. Labs don't indicate worsening infection- CRP.   Tylenol got rid of fever and it hasn't returned.    Paul Wood sat in chair and walked in hallway. He's napping now.  Seems to have been feeling better after fever was treated with tylenol earlier today.

## 2019-08-13 LAB — CBC WITH DIFFERENTIAL/PLATELET
Abs Immature Granulocytes: 0.12 10*3/uL — ABNORMAL HIGH (ref 0.00–0.07)
Basophils Absolute: 0 10*3/uL (ref 0.0–0.1)
Basophils Relative: 0 %
Eosinophils Absolute: 0 10*3/uL (ref 0.0–0.5)
Eosinophils Relative: 0 %
HCT: 33.1 % — ABNORMAL LOW (ref 39.0–52.0)
Hemoglobin: 10.1 g/dL — ABNORMAL LOW (ref 13.0–17.0)
Immature Granulocytes: 1 %
Lymphocytes Relative: 3 %
Lymphs Abs: 0.5 10*3/uL — ABNORMAL LOW (ref 0.7–4.0)
MCH: 28.4 pg (ref 26.0–34.0)
MCHC: 30.5 g/dL (ref 30.0–36.0)
MCV: 93 fL (ref 80.0–100.0)
Monocytes Absolute: 0.4 10*3/uL (ref 0.1–1.0)
Monocytes Relative: 3 %
Neutro Abs: 14.5 10*3/uL — ABNORMAL HIGH (ref 1.7–7.7)
Neutrophils Relative %: 93 %
Platelets: 165 10*3/uL (ref 150–400)
RBC: 3.56 MIL/uL — ABNORMAL LOW (ref 4.22–5.81)
RDW: 15.5 % (ref 11.5–15.5)
WBC: 15.6 10*3/uL — ABNORMAL HIGH (ref 4.0–10.5)
nRBC: 0 % (ref 0.0–0.2)

## 2019-08-13 LAB — C-REACTIVE PROTEIN: CRP: 8.9 mg/dL — ABNORMAL HIGH (ref <1.0)

## 2019-08-13 LAB — COMPREHENSIVE METABOLIC PANEL
ALT: 32 U/L (ref 0–44)
AST: 43 U/L — ABNORMAL HIGH (ref 15–41)
Albumin: 2.5 g/dL — ABNORMAL LOW (ref 3.5–5.0)
Alkaline Phosphatase: 55 U/L (ref 38–126)
Anion gap: 10 (ref 5–15)
BUN: 54 mg/dL — ABNORMAL HIGH (ref 8–23)
CO2: 15 mmol/L — ABNORMAL LOW (ref 22–32)
Calcium: 8.5 mg/dL — ABNORMAL LOW (ref 8.9–10.3)
Chloride: 117 mmol/L — ABNORMAL HIGH (ref 98–111)
Creatinine, Ser: 1.56 mg/dL — ABNORMAL HIGH (ref 0.61–1.24)
GFR calc Af Amer: 46 mL/min — ABNORMAL LOW (ref >60)
GFR calc non Af Amer: 40 mL/min — ABNORMAL LOW (ref >60)
Glucose, Bld: 186 mg/dL — ABNORMAL HIGH (ref 70–99)
Potassium: 5 mmol/L (ref 3.5–5.1)
Sodium: 142 mmol/L (ref 135–145)
Total Bilirubin: 0.8 mg/dL (ref 0.3–1.2)
Total Protein: 6.4 g/dL — ABNORMAL LOW (ref 6.5–8.1)

## 2019-08-13 LAB — GLUCOSE, CAPILLARY
Glucose-Capillary: 132 mg/dL — ABNORMAL HIGH (ref 70–99)
Glucose-Capillary: 187 mg/dL — ABNORMAL HIGH (ref 70–99)
Glucose-Capillary: 179 mg/dL — ABNORMAL HIGH (ref 70–99)
Glucose-Capillary: 233 mg/dL — ABNORMAL HIGH (ref 70–99)

## 2019-08-13 LAB — FERRITIN: Ferritin: 237 ng/mL (ref 24–336)

## 2019-08-13 LAB — PROCALCITONIN: Procalcitonin: 0.1 ng/mL

## 2019-08-13 LAB — D-DIMER, QUANTITATIVE: D-Dimer, Quant: 4.09 ug/mL-FEU — ABNORMAL HIGH (ref 0.00–0.50)

## 2019-08-13 NOTE — Progress Notes (Signed)
PROGRESS NOTE  Paul Wood. [id number] DOB: 1932-03-15 DOA: 08/09/2019 PCP: [name], MD  Brief History   86 y/o male with DM, HTN, CKD, CAD, HTN, depression, presented with generalized weakness and feeling ill for 3 weeks associated with cough and chest congestion.  He has tested covid 19 positive.  He is not requiring oxygen.  He has AKI.  He has been arranged to have a V/Q scan at [facility] (not available at [facility]) and was transferred to [facility] have procedure.   VQ was negative for Pulmonary embolus.   Consultants  . None  Procedures  . VQ scan negative  Antibiotics   Anti-infectives (From admission, onward)   None     Subjective  The patient is resting comfortably. No new complaints. T max 24 was 99.5.  Objective   Vitals:  Vitals:   08/13/19 0431 08/13/19 1400  BP: (!) 150/52 (!) 130/108  Pulse: 65 65  Resp: 18 (!) 25  Temp: 99.5 F (37.5 C) 97.6 F (36.4 C)  SpO2: 100% 99%   Exam:  Constitutional:  . The patient is awake, alert, and oriented x 3. He is elderly, weak, and frail. No acute distress. Respiratory:  . No increased work of breathing. . No wheezes, rales, or rhonchi . No tactile fremitus Cardiovascular:  . Regular rate and rhythm . No murmurs, ectopy, or gallups. . No lateral PMI. No thrills. Abdomen:  . Abdomen is soft, non-tender, non-distended . No hernias, masses, or organomegaly . Normoactive bowel sounds.  Musculoskeletal:  . No cyanosis, clubbing . There is swelling noted in the lower extremities Skin:  . No rashes, lesions, ulcers . palpation of skin: no induration or nodules Neurologic:  . CN 2-12 intact . Sensation all 4 extremities intact Psychiatric:  . Mental status o Mood, affect appropriate o Orientation to person, place, time  . judgment and insight appear intact  I have personally reviewed the following:   Today's Data  . Vitals, CMP, CBC, Ferritin, procalcitonin, and D Dimer  Imaging  . VQ  scan . CXR  Scheduled Meds: . vitamin C  500 mg Oral Daily  . atorvastatin  80 mg Oral Daily  . carvedilol  12.5 mg Oral BID WC  . dexamethasone (DECADRON) injection  6 mg Intravenous Q24H  . doxazosin  2 mg Oral Daily  . insulin aspart  0-5 Units Subcutaneous QHS  . insulin aspart  0-9 Units Subcutaneous TID WC  . insulin aspart  5 Units Subcutaneous TID WC  . insulin glargine  25 Units Subcutaneous QHS  . levothyroxine  100 mcg Oral QAC breakfast  . zinc sulfate  220 mg Oral Daily   Continuous Infusions: . sodium chloride 50 mL/hr at 08/12/19 1446    Principal Problem:   AKI (acute kidney injury) ([place]) Active Problems:   Type 2 diabetes mellitus with stage 3 chronic kidney disease, with long-term current use of insulin (HCC)   Essential hypertension, benign   Coronary atherosclerosis of native coronary artery   CKD (chronic kidney disease), stage III   COVID-19 virus infection   Hyperkalemia   LOS: 3 days   A & P   AKI on CKD stage 3 - Prerenal azotemia treating with IV fluids, Holding lisinopril and lasix. Creatinine is 1.56 today,  improved from 2.16 yesterday. Baseline creatinine 1.6-1.7.  Hyperkalemia: Resolved. Monitor.   Covid 19 infection: There was no infiltrate seen on CXR.  Pt remains on room air.  Pt has been started on decadron  which will be continued.  CRP is coming down, although other inflammatory markers remain elevated.  CXR performed today is stable.  Pulmonary embolus: VQ scan was negative. Will check bilateral lower extremity dopplers.  Fever: Secondary to Covid infection. Tmax 24 was 99.5.  Dyspnea: Subjective. The patient is saturating in the mid-low nineties on room air. Will check ambulatory Sao2.  CXR 08/10/2019 negative for infiltrates.   Thrombocytopenia: Likely due to COVID-19 infection. Improved with platelets of 165 today. Monitor.  Type 2 DM: The patient is continue to follow CBGs closely, continue lantus and SSI coverage. FSBS has ranged  from 132 to 257 in the last 24 hours.  Essential hypertension: Resumed norvasc, carvedilol and doxazosin.  Holding home lasix and lisinopril temporarily.   CAD: Temporarily held aspirin/plavix with thrombocytopenia, carvedilol restarted.   I have examined and seen this patient myself. I have spent 32 minutes in his evaluation and care.  DVT prophylaxis: SCDs Code Status: Full  Family Communication: patient updated bedside, verbalized understanding  Disposition Plan: Pt is from home. Plan is for him to return to home with [facility] PT/OT. Except that son has called and expressed concern for who would care for his father on discharge. Will consult TOC for possible placement.   Paul Mccuen, DO [facility] Direct contact: see [email or web address]  7PM-7AM contact night coverage as above 08/13/2019, 4:03 PM  LOS: 1 day

## 2019-08-14 LAB — COMPREHENSIVE METABOLIC PANEL
ALT: 43 U/L (ref 0–44)
AST: 48 U/L — ABNORMAL HIGH (ref 15–41)
Albumin: 2.5 g/dL — ABNORMAL LOW (ref 3.5–5.0)
Alkaline Phosphatase: 58 U/L (ref 38–126)
Anion gap: 9 (ref 5–15)
BUN: 49 mg/dL — ABNORMAL HIGH (ref 8–23)
CO2: 17 mmol/L — ABNORMAL LOW (ref 22–32)
Calcium: 8.4 mg/dL — ABNORMAL LOW (ref 8.9–10.3)
Chloride: 113 mmol/L — ABNORMAL HIGH (ref 98–111)
Creatinine, Ser: 1.47 mg/dL — ABNORMAL HIGH (ref 0.61–1.24)
GFR calc Af Amer: 50 mL/min — ABNORMAL LOW (ref >60)
GFR calc non Af Amer: 43 mL/min — ABNORMAL LOW (ref >60)
Glucose, Bld: 186 mg/dL — ABNORMAL HIGH (ref 70–99)
Potassium: 5 mmol/L (ref 3.5–5.1)
Sodium: 139 mmol/L (ref 135–145)
Total Bilirubin: 0.6 mg/dL (ref 0.3–1.2)
Total Protein: 6.8 g/dL (ref 6.5–8.1)

## 2019-08-14 LAB — CBC WITH DIFFERENTIAL/PLATELET
Abs Immature Granulocytes: 0.09 10*3/uL — ABNORMAL HIGH (ref 0.00–0.07)
Basophils Absolute: 0 10*3/uL (ref 0.0–0.1)
Basophils Relative: 0 %
Eosinophils Absolute: 0 10*3/uL (ref 0.0–0.5)
Eosinophils Relative: 0 %
HCT: 33.7 % — ABNORMAL LOW (ref 39.0–52.0)
Hemoglobin: 10.6 g/dL — ABNORMAL LOW (ref 13.0–17.0)
Immature Granulocytes: 1 %
Lymphocytes Relative: 3 %
Lymphs Abs: 0.5 10*3/uL — ABNORMAL LOW (ref 0.7–4.0)
MCH: 27.9 pg (ref 26.0–34.0)
MCHC: 31.5 g/dL (ref 30.0–36.0)
MCV: 88.7 fL (ref 80.0–100.0)
Monocytes Absolute: 0.5 10*3/uL (ref 0.1–1.0)
Monocytes Relative: 4 %
Neutro Abs: 13.5 10*3/uL — ABNORMAL HIGH (ref 1.7–7.7)
Neutrophils Relative %: 92 %
Platelets: 178 10*3/uL (ref 150–400)
RBC: 3.8 MIL/uL — ABNORMAL LOW (ref 4.22–5.81)
RDW: 15.4 % (ref 11.5–15.5)
WBC: 14.7 10*3/uL — ABNORMAL HIGH (ref 4.0–10.5)
nRBC: 0 % (ref 0.0–0.2)

## 2019-08-14 LAB — GLUCOSE, CAPILLARY
Glucose-Capillary: 153 mg/dL — ABNORMAL HIGH (ref 70–99)
Glucose-Capillary: 259 mg/dL — ABNORMAL HIGH (ref 70–99)
Glucose-Capillary: 262 mg/dL — ABNORMAL HIGH (ref 70–99)
Glucose-Capillary: 274 mg/dL — ABNORMAL HIGH (ref 70–99)

## 2019-08-14 LAB — PROCALCITONIN: Procalcitonin: <0.1 ng/mL

## 2019-08-14 LAB — FERRITIN: Ferritin: 290 ng/mL (ref 24–336)

## 2019-08-14 LAB — D-DIMER, QUANTITATIVE: D-Dimer, Quant: 4.76 ug/mL-FEU — ABNORMAL HIGH (ref 0.00–0.50)

## 2019-08-14 LAB — C-REACTIVE PROTEIN: CRP: 8.7 mg/dL — ABNORMAL HIGH (ref <1.0)

## 2019-08-14 MED ORDER — LANTUS SOLOSTAR 100 UNIT/ML ~~LOC~~ SOPN: 25.0000 [IU] | PEN_INJECTOR | Freq: Every day | SUBCUTANEOUS | 11 refills | Status: DC

## 2019-08-14 MED ORDER — ZINC SULFATE 220 (50 ZN) MG PO CAPS: 220.0000 mg | ORAL_CAPSULE | Freq: Every day | ORAL | 0 refills | Status: AC

## 2019-08-14 MED ORDER — DEXAMETHASONE 6 MG PO TABS: 6.0000 mg | ORAL_TABLET | Freq: Every day | ORAL | 0 refills | Status: AC

## 2019-08-14 MED ORDER — FUROSEMIDE 20 MG PO TABS: 20.0000 mg | ORAL_TABLET | Freq: Every day | ORAL | Status: DC

## 2019-08-14 MED ORDER — ASCORBIC ACID 500 MG PO TABS: 500.0000 mg | ORAL_TABLET | Freq: Every day | ORAL | 0 refills | Status: AC

## 2019-08-14 MED ORDER — DOXAZOSIN MESYLATE 2 MG PO TABS: 2.0000 mg | ORAL_TABLET | Freq: Every day | ORAL | 0 refills | Status: AC

## 2019-08-14 NOTE — Discharge Summary (Signed)
Physician Discharge Summary  Paul Wood. [id number] DOB: 12-12-1931 DOA: 08/09/2019  PCP: [name], MD  Admit date: 08/09/2019 Discharge date: 08/14/2019  Recommendations for Outpatient Follow-up:  1. Follow up with PCP in 7-10 days. 2. Continue isolation through 08/30/2019. 3. You may receive the COVID vaccine after 09/07/2019. 4. He was given a copy of the COVID isolation/quarantine guidelines 5. Home health PT/OT  Discharge Diagnoses: Principal diagnosis is #1 1. Acute hypoxic respiratory failure 2. COVID-19 pneumonia 3. AKI 4. DM II 5. Hypertension 6. Gout 7. CAD 8. Fed deficiency anemia 9. Glaucoma  Discharge Condition: Fair  Disposition: Home with home health PT/OT  Diet recommendation: Heart healthy with modified carbohydrates  Filed Weights   08/09/19 1153 08/09/19 1715  Weight: (!) 142.9 kg 118.9 kg    History of present illness: Paul Wood. is a 86 y.o. male with medical history significant for  DM, HTN, CAD, CKD3, HTN, depression.  Presented to the ED with complaints of feeling ill for the past 3 weeks, reports difficulty breathing and cough also.  Reports mild swelling to his right lower extremity after he had some mild trauma to his leg.  Denies history of PE or blood clots.  No chest pain. Reports one episode of vomiting yesterday otherwise he has had good p.o. intake and denies diarrhea.  He also denies taking his Lasix.  ED Course: O2 sats greater than 94% on room air, temperature 98.2.  Tachypneic.  WBC 5.  Platelets low at 83.  Creatinine elevated 2.55 with elevated BUN of 82. HS troponin 42>> 32.  D-dimer elevated at 3.69.  Portable chest x-ray shows right upper lobe subsegmental atelectasis only.  EKG unremarkable.  1 L bolus normal saline given in ED.  Hospitalist admit for acute kidney injury in the setting of COVID-19 infection.  Hospital Course:  86 y/o male with DM, HTN, CKD, CAD, HTN, depression, presented with generalized weakness  and feeling ill for 3 weeks associated with cough and chest congestion. He has tested covid 19 positive. He is not requiring oxygen. He has AKI. He has been arranged to have a V/Q scan at [facility] (not available at [facility]) and was transferred to [facility] have procedure.   VQ was negative for Pulmonary embolus.   The patient was admitted to a telemetry bed. He has received IV steroids and breathing treatments. His respiratory status has returned to baseline. He has been evaluated by PT/OT. They have recommended discharge to home with home health PT/OT.  Today's assessment: S: The patient is resting comfortably. He wants to go home. O: Vitals:  Vitals:   08/14/19 1000 08/14/19 1200  BP: (!) 134/59 130/85  Pulse:    Resp: 18 (!) 24  Temp: 98.2 F (36.8 C) 98 F (36.7 C)  SpO2: 91% 94%   Constitutional:   The patient is awake, alert, and oriented x 3. He is elderly, weak, and frail. No acute distress. Respiratory:   No increased work of breathing.  No wheezes, rales, or rhonchi  No tactile fremitus Cardiovascular:   Regular rate and rhythm  No murmurs, ectopy, or gallups.  No lateral PMI. No thrills. Abdomen:   Abdomen is soft, non-tender, non-distended  No hernias, masses, or organomegaly  Normoactive bowel sounds.  Musculoskeletal:   No cyanosis, clubbing  There is swelling noted in the lower extremities Skin:   No rashes, lesions, ulcers  palpation of skin: no induration or nodules Neurologic:   CN 2-12 intact  Sensation all 4  extremities intact Psychiatric:   Mental status ? Mood, affect appropriate ? Orientation to person, place, time   judgment and insight appear intact  Discharge Instructions  Discharge Instructions    (HEART FAILURE PATIENTS) Call MD:  Anytime you have any of the following symptoms: 1) 3 pound weight gain in 24 hours or 5 pounds in 1 week 2) shortness of breath, with or without a dry hacking cough 3) swelling in the hands, feet or  stomach 4) if you have to sleep on extra pillows at night in order to breathe.   Complete by: As directed    Activity as tolerated - No restrictions   Complete by: As directed    Diet - low sodium heart healthy   Complete by: As directed    Discharge instructions   Complete by: As directed    Follow up with PCP in 7-10 days. Continue isolation through 08/30/2019. You may receive the COVID vaccine after 09/07/2019. ?   Person Under Monitoring Name: Paul Wood.  Location: [place] [address]   Infection Prevention Recommendations for Individuals Confirmed to have, or Being Evaluated for, 2019 Novel Coronavirus (COVID-19) Infection Who Receive Care at Home  Individuals who are confirmed to have, or are being evaluated for, COVID-19 should follow the prevention steps below until a healthcare provider or local or state health department says they can return to normal activities.  Stay home except to get medical care You should restrict activities outside your home, except for getting medical care. Do not go to work, school, or public areas, and do not use public transportation or taxis.  Call ahead before visiting your doctor Before your medical appointment, call the healthcare provider and tell them that you have, or are being evaluated for, COVID-19 infection. This will help the healthcare provider's office take steps to keep other people from getting infected. Ask your healthcare provider to call the local or state health department.  Monitor your symptoms Seek prompt medical attention if your illness is worsening (e.g., difficulty breathing). Before going to your medical appointment, call the healthcare provider and tell them that you have, or are being evaluated for, COVID-19 infection. Ask your healthcare provider to call the local or state health department.  Wear a facemask You should wear a facemask that covers your nose and mouth when you are in the same  room with other people and when you visit a healthcare provider. People who live with or visit you should also wear a facemask while they are in the same room with you.  Separate yourself from other people in your home As much as possible, you should stay in a different room from other people in your home. Also, you should use a separate bathroom, if available.  Avoid sharing household items You should not share dishes, drinking glasses, cups, eating utensils, towels, bedding, or other items with other people in your home. After using these items, you should wash them thoroughly with soap and water.  Cover your coughs and sneezes Cover your mouth and nose with a tissue when you cough or sneeze, or you can cough or sneeze into your sleeve. Throw used tissues in a lined trash can, and immediately wash your hands with soap and water for at least 20 seconds or use an alcohol-based hand rub.  Wash your [facility] your hands often and thoroughly with soap and water for at least 20 seconds. You can use an alcohol-based hand sanitizer if soap and  water are not available and if your hands are not visibly dirty. Avoid touching your eyes, nose, and mouth with unwashed hands.   Prevention Steps for Caregivers and Household Members of Individuals Confirmed to have, or Being Evaluated for, COVID-19 Infection Being Cared for in the Home  If you live with, or provide care at home for, a person confirmed to have, or being evaluated for, COVID-19 infection please follow these guidelines to prevent infection:  Follow healthcare provider's instructions Make sure that you understand and can help the patient follow any healthcare provider instructions for all care.  Provide for the patient's basic needs You should help the patient with basic needs in the home and provide support for getting groceries, prescriptions, and other personal needs.  Monitor the patient's symptoms If they are getting sicker,  call his or her medical provider and tell them that the patient has, or is being evaluated for, COVID-19 infection. This will help the healthcare provider's office take steps to keep other people from getting infected. Ask the healthcare provider to call the local or state health department.  Limit the number of people who have contact with the patient If possible, have only one caregiver for the patient. Other household members should stay in another home or place of residence. If this is not possible, they should stay in another room, or be separated from the patient as much as possible. Use a separate bathroom, if available. Restrict visitors who do not have an essential need to be in the home.  Keep older adults, very young children, and other sick people away from the patient Keep older adults, very young children, and those who have compromised immune systems or chronic health conditions away from the patient. This includes people with chronic heart, lung, or kidney conditions, diabetes, and cancer.  Ensure good ventilation Make sure that shared spaces in the home have good air flow, such as from an air conditioner or an opened window, weather permitting.  Wash your hands often Wash your hands often and thoroughly with soap and water for at least 20 seconds. You can use an alcohol based hand sanitizer if soap and water are not available and if your hands are not visibly dirty. Avoid touching your eyes, nose, and mouth with unwashed hands. Use disposable paper towels to dry your hands. If not available, use dedicated cloth towels and replace them when they become wet.  Wear a facemask and gloves Wear a disposable facemask at all times in the room and gloves when you touch or have contact with the patient's blood, body fluids, and/or secretions or excretions, such as sweat, saliva, sputum, nasal mucus, vomit, urine, or feces.  Ensure the mask fits over your nose and mouth tightly, and do  not touch it during use. Throw out disposable facemasks and gloves after using them. Do not reuse. Wash your hands immediately after removing your facemask and gloves. If your personal clothing becomes contaminated, carefully remove clothing and launder. Wash your hands after handling contaminated clothing. Place all used disposable facemasks, gloves, and other waste in a lined container before disposing them with other household waste. Remove gloves and wash your hands immediately after handling these items.  Do not share dishes, glasses, or other household items with the patient Avoid sharing household items. You should not share dishes, drinking glasses, cups, eating utensils, towels, bedding, or other items with a patient who is confirmed to have, or being evaluated for, COVID-19 infection. After the person uses these  items, you should wash them thoroughly with soap and water.  Wash laundry thoroughly Immediately remove and wash clothes or bedding that have blood, body fluids, and/or secretions or excretions, such as sweat, saliva, sputum, nasal mucus, vomit, urine, or feces, on them. Wear gloves when handling laundry from the patient. Read and follow directions on labels of laundry or clothing items and detergent. In general, wash and dry with the warmest temperatures recommended on the label.  Clean all areas the individual has used often Clean all touchable surfaces, such as counters, tabletops, doorknobs, bathroom fixtures, toilets, phones, keyboards, tablets, and bedside tables, every day. Also, clean any surfaces that may have blood, body fluids, and/or secretions or excretions on them. Wear gloves when cleaning surfaces the patient has come in contact with. Use a diluted bleach solution (e.g., dilute bleach with 1 part bleach and 10 parts water) or a household disinfectant with a label that says EPA-registered for coronaviruses. To make a bleach solution at home, add 1 tablespoon of  bleach to 1 quart (4 cups) of water. For a larger supply, add  cup of bleach to 1 gallon (16 cups) of water. Read labels of cleaning products and follow recommendations provided on product labels. Labels contain instructions for safe and effective use of the cleaning product including precautions you should take when applying the product, such as wearing gloves or eye protection and making sure you have good ventilation during use of the product. Remove gloves and wash hands immediately after cleaning.  Monitor yourself for signs and symptoms of illness Caregivers and household members are considered close contacts, should monitor their health, and will be asked to limit movement outside of the home to the extent possible. Follow the monitoring steps for close contacts listed on the symptom monitoring form.   ? If you have additional questions, contact your local health department or call the epidemiologist on call at [phone] (available 24/7). ? This guidance is subject to change. For the most up-to-date guidance from [facility], please refer to their website: YouBlogs.pl   Increase activity slowly   Complete by: As directed      Allergies as of 08/14/2019   No Known Allergies     Medication List    STOP taking these medications   lisinopril 20 MG tablet Commonly known as: ZESTRIL     TAKE these medications   allopurinol 300 MG tablet Commonly known as: ZYLOPRIM Take 300 mg by mouth daily.   amLODipine 5 MG tablet Commonly known as: NORVASC Take 1 tablet (5 mg total) by mouth daily.   ascorbic acid 500 MG tablet Commonly known as: VITAMIN C Take 1 tablet (500 mg total) by mouth daily. Start taking on: August 15, 2019   aspirin EC 325 MG tablet Take 325 mg by mouth daily.   atorvastatin 80 MG tablet Commonly known as: LIPITOR TAKE 1 TABLET BY MOUTH AT BEDTIME ( PT NEEDS OFFICE VISIT) What changed: See the new  instructions.   carvedilol 12.5 MG tablet Commonly known as: COREG TAKE 1 TABLET BY MOUTH TWICE DAILY   clopidogrel 75 MG tablet Commonly known as: PLAVIX TAKE 1 TABLET BY MOUTH EVERY DAY (PATIENT NEEDS OFFICE VISIT) What changed: See the new instructions.   dexamethasone 6 MG tablet Commonly known as: Decadron Take 1 tablet (6 mg total) by mouth daily for 5 days.   doxazosin 2 MG tablet Commonly known as: CARDURA Take 1 tablet (2 mg total) by mouth daily.   furosemide 20 MG tablet  Commonly known as: LASIX Take 1 tablet (20 mg total) by mouth daily. What changed: when to take this   Lantus SoloStar 100 UNIT/ML Solostar Pen Generic drug: Insulin Glargine Inject 25 Units into the skin at bedtime. What changed: how much to take   levothyroxine 100 MCG tablet Commonly known as: SYNTHROID Take 1 tablet (100 mcg total) by mouth daily before breakfast.   pantoprazole 40 MG tablet Commonly known as: PROTONIX Take 40 mg by mouth daily.   tamsulosin 0.4 MG Caps capsule Commonly known as: FLOMAX Take 0.4 mg by mouth daily.   zinc sulfate 220 (50 Zn) MG capsule Take 1 capsule (220 mg total) by mouth daily. Start taking on: August 15, 2019      No Known Allergies  The results of significant diagnostics from this hospitalization (including imaging, microbiology, ancillary and laboratory) are listed below for reference.    Significant Diagnostic Studies: NM Pulmonary Perfusion  Result Date: 08/10/2019 CLINICAL DATA:  Difficulty breathing, cough and lower extremity swelling after some mild trauma to his leg, ill for 3 weeks, elevated D-dimer, history diabetes mellitus, hypertension, chronic kidney disease stage III, coronary artery disease, COVID-19 EXAM: NUCLEAR MEDICINE PERFUSION LUNG SCAN TECHNIQUE: Perfusion images were obtained in multiple projections after intravenous injection of radiopharmaceutical. Ventilation scans intentionally deferred if perfusion scan and chest  x-ray adequate for interpretation during COVID 19 epidemic. RADIOPHARMACEUTICALS:  1.5 mCi Tc-15m MAA IV COMPARISON:  None Correlation: Chest radiograph 08/10/2019 FINDINGS: Elevation versus eventration RIGHT diaphragm and enlargement of cardiac silhouette. Reversal of anterior posterior perfusion gradient, greater anteriorly, which can be seen with pulmonary arterial hypertension. Small subsegmental perfusion defect posterior RIGHT upper lobe. No additional perfusion defects. Findings represent a very low probability for pulmonary embolism. IMPRESSION: Very low probability for pulmonary embolism. Question pulmonary arterial hypertension. Electronically Signed   By: Lavonia Dana M.D.   On: 08/10/2019 16:37   DG CHEST PORT 1 VIEW  Result Date: 08/12/2019 CLINICAL DATA:  History of COVID EXAM: PORTABLE CHEST 1 VIEW COMPARISON:  Chest x-ray 08/10/2019 FINDINGS: Heart size is stable, accentuated by portable technique. Signs of calcified atherosclerotic changes in the thoracic aorta. Patchy bilateral opacities and elevation of the right hemidiaphragm similar to the prior study. No signs of dense consolidation. No acute bone finding. IMPRESSION: Stable patchy bilateral opacities and elevation of the right hemidiaphragm. No change from recent prior. Electronically Signed   By: Zetta Bills M.D.   On: 08/12/2019 09:02   Portable chest 1 View  Result Date: 08/10/2019 CLINICAL DATA:  Shortness of breath at night for 3-4 weeks EXAM: PORTABLE CHEST 1 VIEW COMPARISON:  Yesterday FINDINGS: Chronic elevation of the right diaphragm with streaky and hazy pulmonary density at the right base. Comparatively clear left lung. Normal heart size for technique. Atherosclerosis of the aorta. IMPRESSION: Chronic elevation of the right diaphragm with right base pulmonary opacity that could reflect scarring, atelectasis, or infection. Electronically Signed   By: Monte Fantasia M.D.   On: 08/10/2019 07:36   DG Chest Portable 1  View  Result Date: 08/09/2019 CLINICAL DATA:  Weakness, short of breath, lower extremity edema EXAM: PORTABLE CHEST 1 VIEW COMPARISON:  02/08/2015 FINDINGS: Single frontal view of the chest demonstrates an unremarkable cardiac silhouette. Likely subsegmental atelectasis right upper lobe abutting the minor fissure. No acute airspace disease, effusion, or pneumothorax. Hypertrophic changes overlying the anterior right first rib unchanged. Colonic interposition right upper quadrant. IMPRESSION: 1. Likely subsegmental atelectasis right upper lobe. 2. No acute intrathoracic process.  Electronically Signed   By: Randa Ngo M.D.   On: 08/09/2019 12:34    Microbiology: Recent Results (from the past 240 hour(s))  Respiratory Panel by RT PCR (Flu A&B, Covid) - Nasopharyngeal Swab     Status: Abnormal   Collection Time: 08/09/19  1:18 PM   Specimen: Nasopharyngeal Swab  Result Value Ref Range Status   SARS Coronavirus 2 by RT PCR POSITIVE (A) NEGATIVE Final    Comment: RESULT CALLED TO, READ BACK BY AND VERIFIED WITH: STEINHAUER,C. AT 1413 ON 08/09/2019 BY BAUGHAM,M. (NOTE) SARS-CoV-2 target nucleic acids are DETECTED. SARS-CoV-2 RNA is generally detectable in upper respiratory specimens  during the acute phase of infection. Positive results are indicative of the presence of the identified virus, but do not rule out bacterial infection or co-infection with other pathogens not detected by the test. Clinical correlation with patient history and other diagnostic information is necessary to determine patient infection status. The expected result is Negative. Fact Sheet for Patients:  PinkCheek.be Fact Sheet for Healthcare Providers: GravelBags.it This test is not yet approved or cleared by the [place] FDA and  has been authorized for detection and/or diagnosis of SARS-CoV-2 by FDA under an Emergency Use Authorization (EUA).  This EUA  will remain in effect (meaning this test  can be used) for the duration of  the COVID-19 declaration under Section 564(b)(1) of the Act, 21 U.S.C. section 360bbb-3(b)(1), unless the authorization is terminated or revoked sooner.    Influenza A by PCR NEGATIVE NEGATIVE Final   Influenza B by PCR NEGATIVE NEGATIVE Final    Comment: (NOTE) The Xpert Xpress SARS-CoV-2/FLU/RSV assay is intended as an aid in  the diagnosis of influenza from Nasopharyngeal swab specimens and  should not be used as a sole basis for treatment. Nasal washings and  aspirates are unacceptable for Xpert Xpress SARS-CoV-2/FLU/RSV  testing. Fact Sheet for Patients: PinkCheek.be Fact Sheet for Healthcare Providers: GravelBags.it This test is not yet approved or cleared by the [place] FDA and  has been authorized for detection and/or diagnosis of SARS-CoV-2 by  FDA under an Emergency Use Authorization (EUA). This EUA will remain  in effect (meaning this test can be used) for the duration of the  Covid-19 declaration under Section 564(b)(1) of the Act, 21  U.S.C. section 360bbb-3(b)(1), unless the authorization is  terminated or revoked. Performed at [facility], [address], [place], [place] [address]      Labs: Basic Metabolic Panel: Recent Labs  Lab 08/10/19 0413 08/11/19 0409 08/12/19 0500 08/13/19 1315 08/14/19 0434  NA 139 142 142 142 139  K 5.2* 4.5 4.6 5.0 5.0  CL 111 113* 113* 117* 113*  CO2 19* 19* 19* 15* 17*  GLUCOSE 221* 189* 158* 186* 186*  BUN 75* 75* 63* 54* 49*  CREATININE 2.16* 2.14* 1.83* 1.56* 1.47*  CALCIUM 7.8* 8.0* 8.3* 8.5* 8.4*   Liver Function Tests: Recent Labs  Lab 08/10/19 0413 08/11/19 0409 08/12/19 0500 08/13/19 1315 08/14/19 0434  AST 65* 70* 61* 43* 48*  ALT 25 30 33 32 43  ALKPHOS 55 54 57 55 58  BILITOT 0.7 0.5 0.9 0.8 0.6  PROT 6.4* 6.2* 6.3* 6.4* 6.8  ALBUMIN 3.0* 2.8* 2.8* 2.5* 2.5*   No  results for input(s): LIPASE, AMYLASE in the last 168 hours. No results for input(s): AMMONIA in the last 168 hours. CBC: Recent Labs  Lab 08/10/19 0413 08/11/19 0409 08/12/19 0500 08/13/19 1315 08/14/19 0434  WBC 3.2* 9.2 13.4* 15.6*  14.7*  NEUTROABS 2.7 8.3* 12.2* 14.5* 13.5*  HGB 10.1* 9.9* 10.3* 10.1* 10.6*  HCT 32.7* 31.5* 32.5* 33.1* 33.7*  MCV 92.1 90.3 89.3 93.0 88.7  PLT 82* 99* 118* 165 178   Cardiac Enzymes: No results for input(s): CKTOTAL, CKMB, CKMBINDEX, TROPONINI in the last 168 hours. BNP: BNP (last 3 results) Recent Labs    08/09/19 1229  BNP 95.0    ProBNP (last 3 results) No results for input(s): PROBNP in the last 8760 hours.  CBG: Recent Labs  Lab 08/13/19 1154 08/13/19 1628 08/13/19 2114 08/14/19 0805 08/14/19 1141  GLUCAP 187* 179* 233* 153* 259*    Principal Problem:   AKI (acute kidney injury) (HCC) Active Problems:   Type 2 diabetes mellitus with stage 3 chronic kidney disease, with long-term current use of insulin (HCC)   Essential hypertension, benign   Coronary atherosclerosis of native coronary artery   CKD (chronic kidney disease), stage III   COVID-19 virus infection   Hyperkalemia   Time coordinating discharge: 38 minutes.  Signed:        Moris Ratchford, DO [facility]  08/14/2019, 3:07 PM

## 2019-08-14 NOTE — TOC Transition Note (Addendum)
Transition of Care [facility]) - CM/SW Discharge Note   Patient Details  Name: Paul Wood. MRN: [id number] Date of Birth: January 29, 1932  [facility]) CM/SW Contact:  Carles Collet, RN Phone Number: 08/14/2019, 4:32 PM   Clinical Narrative:    Spoke with daughter Otilio Saber on the phone [phone]. She states that she will be bringing her dad home to her house at  [place] [id number]  She is in the process of moving out some furniture from a room and setting up a bed for him. She states that she had never heard from any doctor all day and is not ready to come get him. She states that if she had some warning she may have been able to batter plan getting him today.  She states that she should be able to finish and pick him up first thing in the morning.   She is agreeable to [facility] with [facility], referral accepted. La[place]ne aware that [facility] will not be until at least Wednesday and they will be in contact with her to set this up.     Final next level of care: [place] Barriers to Discharge: No Barriers Identified   Patient Goals and CMS Choice Patient states their goals for this hospitalization and ongoing recovery are:: to go home CMS Medicare.gov Compare Post Acute Care list provided to:: Other (Comment Required) Choice offered to / list presented to : Adult Children  Discharge Placement                       Discharge Plan and Services                          HH Arranged: PT, RN [facility] Agency: [place] Date [facility] Contacted: 08/14/19 Time [place]: [phone] Representative spoke with at [place]: [place] Determinants of Health ([place]) Interventions     Readmission Risk Interventions No flowsheet data found.

## 2019-08-14 NOTE — Progress Notes (Signed)
Gave daughter an update on patient and discharge.  She felt unprepared for him coming home today. Reached out to Case Manager about home health. Informed Dr. Benny Lennert that patient's daughter would an update on her father.

## 2019-08-15 LAB — GLUCOSE, CAPILLARY
Glucose-Capillary: 225 mg/dL — ABNORMAL HIGH (ref 70–99)
Glucose-Capillary: 191 mg/dL — ABNORMAL HIGH (ref 70–99)

## 2019-08-15 NOTE — Discharge Summary (Signed)
Physician Discharge Summary  Paul Wood. [id number] DOB: 10-Jun-1932 DOA: 08/09/2019  PCP: [name], MD  Admit date: 08/09/2019 Discharge date: 08/15/2019  Recommendations for Outpatient Follow-up:  1. Follow up with PCP in 7-10 days. 2. Continue isolation through 08/30/2019. 3. You may receive the COVID vaccine after 09/07/2019. 4. He was given a copy of the COVID isolation/quarantine guidelines 5. Home health PT/OT Follow-up Information    [facility].   Contact information: [place] [address] [phone]          Discharge Diagnoses: Principal diagnosis is #1 1. Acute hypoxic respiratory failure 2. COVID-19 pneumonia 3. AKI 4. DM II 5. Hypertension 6. Gout 7. CAD 8. Fed deficiency anemia 9. Glaucoma  Discharge Condition: Fair  Disposition: Home with home health PT/OT  Diet recommendation: Heart healthy with modified carbohydrates  Filed Weights   08/09/19 1153 08/09/19 1715  Weight: (!) 142.9 kg 118.9 kg    History of present illness: Paul Wood. is a 86 y.o. male with medical history significant for  DM, HTN, CAD, CKD3, HTN, depression.  Presented to the ED with complaints of feeling ill for the past 3 weeks, reports difficulty breathing and cough also.  Reports mild swelling to his right lower extremity after he had some mild trauma to his leg.  Denies history of PE or blood clots.  No chest pain. Reports one episode of vomiting yesterday otherwise he has had good p.o. intake and denies diarrhea.  He also denies taking his Lasix.  ED Course: O2 sats greater than 94% on room air, temperature 98.2.  Tachypneic.  WBC 5.  Platelets low at 83.  Creatinine elevated 2.55 with elevated BUN of 82. HS troponin 42>> 32.  D-dimer elevated at 3.69.  Portable chest x-ray shows right upper lobe subsegmental atelectasis only.  EKG unremarkable.  1 L bolus normal saline given in ED.  Hospitalist admit for acute  kidney injury in the setting of COVID-19 infection.  Hospital Course:  86 y/o male with DM, HTN, CKD, CAD, HTN, depression, presented with generalized weakness and feeling ill for 3 weeks associated with cough and chest congestion. He has tested covid 19 positive. He is not requiring oxygen. He has AKI. He has been arranged to have a V/Q scan at [facility] (not available at [facility]) and was transferred to [facility] have procedure.   VQ was negative for Pulmonary embolus.   The patient was admitted to a telemetry bed. He has received IV steroids and breathing treatments. His respiratory status has returned to baseline. He has been evaluated by PT/OT. They have recommended discharge to home with home health PT/OT.  Today's assessment: S: The patient is resting comfortably. He wants to go home. O: Vitals:  Vitals:   08/15/19 0425 08/15/19 0800  BP: 135/90 (!) 159/67  Pulse:  (!) 56  Resp:    Temp: 98.5 F (36.9 C) 98.2 F (36.8 C)  SpO2: 98%    Constitutional:   The patient is awake, alert, and oriented x 3. He is elderly, weak, and frail. No acute distress. Respiratory:   No increased work of breathing.  No wheezes, rales, or rhonchi  No tactile fremitus Cardiovascular:   Regular rate and rhythm  No murmurs, ectopy, or gallups.  No lateral PMI. No thrills. Abdomen:   Abdomen is soft, non-tender, non-distended  No hernias, masses, or organomegaly  Normoactive bowel sounds.  Musculoskeletal:   No cyanosis, clubbing  There is  swelling noted in the lower extremities Skin:   No rashes, lesions, ulcers  palpation of skin: no induration or nodules Neurologic:   CN 2-12 intact  Sensation all 4 extremities intact Psychiatric:   Mental status ? Mood, affect appropriate ? Orientation to person, place, time   judgment and insight appear intact  Discharge Instructions  Discharge Instructions    MyChart COVID-19 home monitoring program   Complete by: Aug 14, 2019    Is  the patient willing to use the [place] for home monitoring?: Yes   (HEART FAILURE PATIENTS) Call MD:  Anytime you have any of the following symptoms: 1) 3 pound weight gain in 24 hours or 5 pounds in 1 week 2) shortness of breath, with or without a dry hacking cough 3) swelling in the hands, feet or stomach 4) if you have to sleep on extra pillows at night in order to breathe.   Complete by: As directed    Activity as tolerated - No restrictions   Complete by: As directed    Diet - low sodium heart healthy   Complete by: As directed    Discharge instructions   Complete by: As directed    Follow up with PCP in 7-10 days. Discharge to home with home health PT/OT Continue isolation through 08/30/2019. You may receive the COVID vaccine after 09/07/2019. ?   Person Under Monitoring Name: Paul Wood.  Location: [place] [address]   Infection Prevention Recommendations for Individuals Confirmed to have, or Being Evaluated for, 2019 Novel Coronavirus (COVID-19) Infection Who Receive Care at Home  Individuals who are confirmed to have, or are being evaluated for, COVID-19 should follow the prevention steps below until a healthcare provider or local or state health department says they can return to normal activities.  Stay home except to get medical care You should restrict activities outside your home, except for getting medical care. Do not go to work, school, or public areas, and do not use public transportation or taxis.  Call ahead before visiting your doctor Before your medical appointment, call the healthcare provider and tell them that you have, or are being evaluated for, COVID-19 infection. This will help the healthcare provider's office take steps to keep other people from getting infected. Ask your healthcare provider to call the local or state health department.  Monitor your symptoms Seek prompt medical attention if your illness is worsening  (e.g., difficulty breathing). Before going to your medical appointment, call the healthcare provider and tell them that you have, or are being evaluated for, COVID-19 infection. Ask your healthcare provider to call the local or state health department.  Wear a facemask You should wear a facemask that covers your nose and mouth when you are in the same room with other people and when you visit a healthcare provider. People who live with or visit you should also wear a facemask while they are in the same room with you.  Separate yourself from other people in your home As much as possible, you should stay in a different room from other people in your home. Also, you should use a separate bathroom, if available.  Avoid sharing household items You should not share dishes, drinking glasses, cups, eating utensils, towels, bedding, or other items with other people in your home. After using these items, you should wash them thoroughly with soap and water.  Cover your coughs and sneezes Cover your mouth and nose with a tissue when you  cough or sneeze, or you can cough or sneeze into your sleeve. Throw used tissues in a lined trash can, and immediately wash your hands with soap and water for at least 20 seconds or use an alcohol-based hand rub.  Wash your [facility] your hands often and thoroughly with soap and water for at least 20 seconds. You can use an alcohol-based hand sanitizer if soap and water are not available and if your hands are not visibly dirty. Avoid touching your eyes, nose, and mouth with unwashed hands.   Prevention Steps for Caregivers and Household Members of Individuals Confirmed to have, or Being Evaluated for, COVID-19 Infection Being Cared for in the Home  If you live with, or provide care at home for, a person confirmed to have, or being evaluated for, COVID-19 infection please follow these guidelines to prevent infection:  Follow healthcare provider's  instructions Make sure that you understand and can help the patient follow any healthcare provider instructions for all care.  Provide for the patient's basic needs You should help the patient with basic needs in the home and provide support for getting groceries, prescriptions, and other personal needs.  Monitor the patient's symptoms If they are getting sicker, call his or her medical provider and tell them that the patient has, or is being evaluated for, COVID-19 infection. This will help the healthcare provider's office take steps to keep other people from getting infected. Ask the healthcare provider to call the local or state health department.  Limit the number of people who have contact with the patient If possible, have only one caregiver for the patient. Other household members should stay in another home or place of residence. If this is not possible, they should stay in another room, or be separated from the patient as much as possible. Use a separate bathroom, if available. Restrict visitors who do not have an essential need to be in the home.  Keep older adults, very young children, and other sick people away from the patient Keep older adults, very young children, and those who have compromised immune systems or chronic health conditions away from the patient. This includes people with chronic heart, lung, or kidney conditions, diabetes, and cancer.  Ensure good ventilation Make sure that shared spaces in the home have good air flow, such as from an air conditioner or an opened window, weather permitting.  Wash your hands often Wash your hands often and thoroughly with soap and water for at least 20 seconds. You can use an alcohol based hand sanitizer if soap and water are not available and if your hands are not visibly dirty. Avoid touching your eyes, nose, and mouth with unwashed hands. Use disposable paper towels to dry your hands. If not available, use dedicated cloth  towels and replace them when they become wet.  Wear a facemask and gloves Wear a disposable facemask at all times in the room and gloves when you touch or have contact with the patient's blood, body fluids, and/or secretions or excretions, such as sweat, saliva, sputum, nasal mucus, vomit, urine, or feces.  Ensure the mask fits over your nose and mouth tightly, and do not touch it during use. Throw out disposable facemasks and gloves after using them. Do not reuse. Wash your hands immediately after removing your facemask and gloves. If your personal clothing becomes contaminated, carefully remove clothing and launder. Wash your hands after handling contaminated clothing. Place all used disposable facemasks, gloves, and other waste in a lined container  before disposing them with other household waste. Remove gloves and wash your hands immediately after handling these items.  Do not share dishes, glasses, or other household items with the patient Avoid sharing household items. You should not share dishes, drinking glasses, cups, eating utensils, towels, bedding, or other items with a patient who is confirmed to have, or being evaluated for, COVID-19 infection. After the person uses these items, you should wash them thoroughly with soap and water.  Wash laundry thoroughly Immediately remove and wash clothes or bedding that have blood, body fluids, and/or secretions or excretions, such as sweat, saliva, sputum, nasal mucus, vomit, urine, or feces, on them. Wear gloves when handling laundry from the patient. Read and follow directions on labels of laundry or clothing items and detergent. In general, wash and dry with the warmest temperatures recommended on the label.  Clean all areas the individual has used often Clean all touchable surfaces, such as counters, tabletops, doorknobs, bathroom fixtures, toilets, phones, keyboards, tablets, and bedside tables, every day. Also, clean any surfaces that may  have blood, body fluids, and/or secretions or excretions on them. Wear gloves when cleaning surfaces the patient has come in contact with. Use a diluted bleach solution (e.g., dilute bleach with 1 part bleach and 10 parts water) or a household disinfectant with a label that says EPA-registered for coronaviruses. To make a bleach solution at home, add 1 tablespoon of bleach to 1 quart (4 cups) of water. For a larger supply, add  cup of bleach to 1 gallon (16 cups) of water. Read labels of cleaning products and follow recommendations provided on product labels. Labels contain instructions for safe and effective use of the cleaning product including precautions you should take when applying the product, such as wearing gloves or eye protection and making sure you have good ventilation during use of the product. Remove gloves and wash hands immediately after cleaning.  Monitor yourself for signs and symptoms of illness Caregivers and household members are considered close contacts, should monitor their health, and will be asked to limit movement outside of the home to the extent possible. Follow the monitoring steps for close contacts listed on the symptom monitoring form.   ? If you have additional questions, contact your local health department or call the epidemiologist on call at [phone] (available 24/7). ? This guidance is subject to change. For the most up-to-date guidance from [facility], please refer to their website: YouBlogs.pl   Increase activity slowly   Complete by: As directed      Allergies as of 08/15/2019   No Known Allergies     Medication List    STOP taking these medications   lisinopril 20 MG tablet Commonly known as: ZESTRIL     TAKE these medications   allopurinol 300 MG tablet Commonly known as: ZYLOPRIM Take 300 mg by mouth daily.   amLODipine 5 MG tablet Commonly known as: NORVASC Take 1 tablet (5  mg total) by mouth daily.   ascorbic acid 500 MG tablet Commonly known as: VITAMIN C Take 1 tablet (500 mg total) by mouth daily.   aspirin EC 325 MG tablet Take 325 mg by mouth daily.   atorvastatin 80 MG tablet Commonly known as: LIPITOR TAKE 1 TABLET BY MOUTH AT BEDTIME ( PT NEEDS OFFICE VISIT) What changed: See the new instructions.   carvedilol 12.5 MG tablet Commonly known as: COREG TAKE 1 TABLET BY MOUTH TWICE DAILY   clopidogrel 75 MG tablet Commonly known as: PLAVIX TAKE  1 TABLET BY MOUTH EVERY DAY (PATIENT NEEDS OFFICE VISIT) What changed: See the new instructions.   dexamethasone 6 MG tablet Commonly known as: Decadron Take 1 tablet (6 mg total) by mouth daily for 5 days.   doxazosin 2 MG tablet Commonly known as: CARDURA Take 1 tablet (2 mg total) by mouth daily.   furosemide 20 MG tablet Commonly known as: LASIX Take 1 tablet (20 mg total) by mouth daily. What changed: when to take this   Lantus SoloStar 100 UNIT/ML Solostar Pen Generic drug: Insulin Glargine Inject 25 Units into the skin at bedtime. What changed: how much to take   levothyroxine 100 MCG tablet Commonly known as: SYNTHROID Take 1 tablet (100 mcg total) by mouth daily before breakfast.   pantoprazole 40 MG tablet Commonly known as: PROTONIX Take 40 mg by mouth daily.   tamsulosin 0.4 MG Caps capsule Commonly known as: FLOMAX Take 0.4 mg by mouth daily.   zinc sulfate 220 (50 Zn) MG capsule Take 1 capsule (220 mg total) by mouth daily.      No Known Allergies  The results of significant diagnostics from this hospitalization (including imaging, microbiology, ancillary and laboratory) are listed below for reference.    Significant Diagnostic Studies: NM Pulmonary Perfusion  Result Date: 08/10/2019 CLINICAL DATA:  Difficulty breathing, cough and lower extremity swelling after some mild trauma to his leg, ill for 3 weeks, elevated D-dimer, history diabetes mellitus, hypertension,  chronic kidney disease stage III, coronary artery disease, COVID-19 EXAM: NUCLEAR MEDICINE PERFUSION LUNG SCAN TECHNIQUE: Perfusion images were obtained in multiple projections after intravenous injection of radiopharmaceutical. Ventilation scans intentionally deferred if perfusion scan and chest x-ray adequate for interpretation during COVID 19 epidemic. RADIOPHARMACEUTICALS:  1.5 mCi Tc-2m MAA IV COMPARISON:  None Correlation: Chest radiograph 08/10/2019 FINDINGS: Elevation versus eventration RIGHT diaphragm and enlargement of cardiac silhouette. Reversal of anterior posterior perfusion gradient, greater anteriorly, which can be seen with pulmonary arterial hypertension. Small subsegmental perfusion defect posterior RIGHT upper lobe. No additional perfusion defects. Findings represent a very low probability for pulmonary embolism. IMPRESSION: Very low probability for pulmonary embolism. Question pulmonary arterial hypertension. Electronically Signed   By: Lavonia Dana M.D.   On: 08/10/2019 16:37   DG CHEST PORT 1 VIEW  Result Date: 08/12/2019 CLINICAL DATA:  History of COVID EXAM: PORTABLE CHEST 1 VIEW COMPARISON:  Chest x-ray 08/10/2019 FINDINGS: Heart size is stable, accentuated by portable technique. Signs of calcified atherosclerotic changes in the thoracic aorta. Patchy bilateral opacities and elevation of the right hemidiaphragm similar to the prior study. No signs of dense consolidation. No acute bone finding. IMPRESSION: Stable patchy bilateral opacities and elevation of the right hemidiaphragm. No change from recent prior. Electronically Signed   By: Zetta Bills M.D.   On: 08/12/2019 09:02   Portable chest 1 View  Result Date: 08/10/2019 CLINICAL DATA:  Shortness of breath at night for 3-4 weeks EXAM: PORTABLE CHEST 1 VIEW COMPARISON:  Yesterday FINDINGS: Chronic elevation of the right diaphragm with streaky and hazy pulmonary density at the right base. Comparatively clear left lung. Normal heart  size for technique. Atherosclerosis of the aorta. IMPRESSION: Chronic elevation of the right diaphragm with right base pulmonary opacity that could reflect scarring, atelectasis, or infection. Electronically Signed   By: Monte Fantasia M.D.   On: 08/10/2019 07:36   DG Chest Portable 1 View  Result Date: 08/09/2019 CLINICAL DATA:  Weakness, short of breath, lower extremity edema EXAM: PORTABLE CHEST 1 VIEW COMPARISON:  02/08/2015 FINDINGS: Single frontal view of the chest demonstrates an unremarkable cardiac silhouette. Likely subsegmental atelectasis right upper lobe abutting the minor fissure. No acute airspace disease, effusion, or pneumothorax. Hypertrophic changes overlying the anterior right first rib unchanged. Colonic interposition right upper quadrant. IMPRESSION: 1. Likely subsegmental atelectasis right upper lobe. 2. No acute intrathoracic process. Electronically Signed   By: Randa Ngo M.D.   On: 08/09/2019 12:34    Microbiology: Recent Results (from the past 240 hour(s))  Respiratory Panel by RT PCR (Flu A&B, Covid) - Nasopharyngeal Swab     Status: Abnormal   Collection Time: 08/09/19  1:18 PM   Specimen: Nasopharyngeal Swab  Result Value Ref Range Status   SARS Coronavirus 2 by RT PCR POSITIVE (A) NEGATIVE Final    Comment: RESULT CALLED TO, READ BACK BY AND VERIFIED WITH: STEINHAUER,C. AT 1413 ON 08/09/2019 BY BAUGHAM,M. (NOTE) SARS-CoV-2 target nucleic acids are DETECTED. SARS-CoV-2 RNA is generally detectable in upper respiratory specimens  during the acute phase of infection. Positive results are indicative of the presence of the identified virus, but do not rule out bacterial infection or co-infection with other pathogens not detected by the test. Clinical correlation with patient history and other diagnostic information is necessary to determine patient infection status. The expected result is Negative. Fact Sheet for Patients:   PinkCheek.be Fact Sheet for Healthcare Providers: GravelBags.it This test is not yet approved or cleared by the [place] FDA and  has been authorized for detection and/or diagnosis of SARS-CoV-2 by FDA under an Emergency Use Authorization (EUA).  This EUA will remain in effect (meaning this test  can be used) for the duration of  the COVID-19 declaration under Section 564(b)(1) of the Act, 21 U.S.C. section 360bbb-3(b)(1), unless the authorization is terminated or revoked sooner.    Influenza A by PCR NEGATIVE NEGATIVE Final   Influenza B by PCR NEGATIVE NEGATIVE Final    Comment: (NOTE) The Xpert Xpress SARS-CoV-2/FLU/RSV assay is intended as an aid in  the diagnosis of influenza from Nasopharyngeal swab specimens and  should not be used as a sole basis for treatment. Nasal washings and  aspirates are unacceptable for Xpert Xpress SARS-CoV-2/FLU/RSV  testing. Fact Sheet for Patients: PinkCheek.be Fact Sheet for Healthcare Providers: GravelBags.it This test is not yet approved or cleared by the [place] FDA and  has been authorized for detection and/or diagnosis of SARS-CoV-2 by  FDA under an Emergency Use Authorization (EUA). This EUA will remain  in effect (meaning this test can be used) for the duration of the  Covid-19 declaration under Section 564(b)(1) of the Act, 21  U.S.C. section 360bbb-3(b)(1), unless the authorization is  terminated or revoked. Performed at [facility], [address] [place], [place] [address]      Labs: Basic Metabolic Panel: Recent Labs  Lab 08/10/19 0413 08/11/19 0409 08/12/19 0500 08/13/19 1315 08/14/19 0434  NA 139 142 142 142 139  K 5.2* 4.5 4.6 5.0 5.0  CL 111 113* 113* 117* 113*  CO2 19* 19* 19* 15* 17*  GLUCOSE 221* 189* 158* 186* 186*  BUN 75* 75* 63* 54* 49*  CREATININE 2.16* 2.14* 1.83* 1.56* 1.47*   CALCIUM 7.8* 8.0* 8.3* 8.5* 8.4*   Liver Function Tests: Recent Labs  Lab 08/10/19 0413 08/11/19 0409 08/12/19 0500 08/13/19 1315 08/14/19 0434  AST 65* 70* 61* 43* 48*  ALT 25 30 33 32 43  ALKPHOS 55 54 57 55 58  BILITOT 0.7 0.5 0.9 0.8 0.6  PROT 6.4* 6.2* 6.3* 6.4* 6.8  ALBUMIN 3.0* 2.8* 2.8* 2.5* 2.5*   No results for input(s): LIPASE, AMYLASE in the last 168 hours. No results for input(s): AMMONIA in the last 168 hours. CBC: Recent Labs  Lab 08/10/19 0413 08/11/19 0409 08/12/19 0500 08/13/19 1315 08/14/19 0434  WBC 3.2* 9.2 13.4* 15.6* 14.7*  NEUTROABS 2.7 8.3* 12.2* 14.5* 13.5*  HGB 10.1* 9.9* 10.3* 10.1* 10.6*  HCT 32.7* 31.5* 32.5* 33.1* 33.7*  MCV 92.1 90.3 89.3 93.0 88.7  PLT 82* 99* 118* 165 178   Cardiac Enzymes: No results for input(s): CKTOTAL, CKMB, CKMBINDEX, TROPONINI in the last 168 hours. BNP: BNP (last 3 results) Recent Labs    08/09/19 1229  BNP 95.0    ProBNP (last 3 results) No results for input(s): PROBNP in the last 8760 hours.  CBG: Recent Labs  Lab 08/14/19 0805 08/14/19 1141 08/14/19 1631 08/14/19 2145 08/15/19 0741  GLUCAP 153* 259* 262* 274* 225*    Principal Problem:   AKI (acute kidney injury) (HCC) Active Problems:   Type 2 diabetes mellitus with stage 3 chronic kidney disease, with long-term current use of insulin (HCC)   Essential hypertension, benign   Coronary atherosclerosis of native coronary artery   CKD (chronic kidney disease), stage III   COVID-19 virus infection   Hyperkalemia   Time coordinating discharge: 38 minutes.  Signed:        Etheline Geppert, DO [facility]  08/15/2019, 8:50 AM

## 2019-08-15 NOTE — Progress Notes (Signed)
Physical Therapy Treatment Patient Details Name: Paul Wood. MRN: [id number] DOB: 04/04/1932 Today's Date: 08/15/2019    History of Present Illness 86 yo male present to ED with difficulty breathing and cough. VQ was negative for Pulmonary embolus. PMH including DM, HTN, CAD, CKD3, HTN, and depression.    PT Comments    Pt admitted with above diagnosis. Pt limited by confusion today.Pt  Is safe with RW however does need close supervision.   Pt currently with functional limitations due to balance and endurance deficits. Pt will benefit from skilled PT to increase their independence and safety with mobility to allow discharge to the venue listed below.     Follow Up Recommendations  Home health PT;Supervision/Assistance - 24 hour(must have 24 hour care and pt states son is always there)     Equipment Recommendations  None recommended by PT    Recommendations for Other Services       Precautions / Restrictions Precautions Precautions: Fall Restrictions Weight Bearing Restrictions: No    Mobility  Bed Mobility Overal bed mobility: Needs Assistance Bed Mobility: Sit to Supine;Supine to Sit     Supine to sit: Supervision Sit to supine: Supervision   General bed mobility comments: Increased time due to confusion. Asked pt to come to sit EOb and pt sat up on opposite side of bed where bed rail was up. couild not redirect pt.   Transfers Overall transfer level: Needs assistance Equipment used: Rolling walker (2 wheeled) Transfers: Sit to/from Stand Sit to Stand: Min guard         General transfer comment: Min Guard A for safety. cues for hand placement  Ambulation/Gait Ambulation/Gait assistance: Min guard Gait Distance (Feet): 50 Feet Assistive device: Rolling walker (2 wheeled) Gait Pattern/deviations: Step-through pattern;Decreased stride length   Gait velocity interpretation: <1.31 ft/sec, indicative of household ambulator General Gait Details: Pt ambulated  well with RW overall but refused to go in hallway.    Stairs             Wheelchair Mobility    Modified Rankin (Stroke Patients Only)       Balance Overall balance assessment: Needs assistance Sitting-balance support: No upper extremity supported;Feet supported Sitting balance-Leahy Scale: Good     Standing balance support: No upper extremity supported;During functional activity Standing balance-Leahy Scale: Fair Standing balance comment: Able to stand at sink without UE to perform oral care                            Cognition Arousal/Alertness: Awake/alert Behavior During Therapy: Flat affect;Impulsive Overall Cognitive Status: No family/caregiver present to determine baseline cognitive functioning Area of Impairment: Attention;Memory;Following commands;Safety/judgement;Awareness;Problem solving                   Current Attention Level: Sustained Memory: Decreased short-term memory Following Commands: Follows one step commands inconsistently;Follows one step commands with increased time Safety/Judgement: Decreased awareness of safety;Decreased awareness of deficits Awareness: Emergent;Intellectual Problem Solving: Slow processing;Requires verbal cues;Difficulty sequencing General Comments: Upon arrival, pt had clothes off  Pt unaware that he was undressed. States he is at [facility]. unaware of date. Pt requiring increased time and cues thorughout session. Pt staring blankly at therapist during conversation.       Exercises      General Comments General comments (skin integrity, edema, etc.): VSS 96% on rA      Pertinent Vitals/Pain Pain Assessment: No/denies pain    Home Living  Prior Function            PT Goals (current goals can now be found in the care plan section) Acute Rehab PT Goals Patient Stated Goal: "Go home" Progress towards PT goals: Progressing toward goals    Frequency     Min 3X/week      PT Plan Current plan remains appropriate    Co-evaluation              AM-PAC PT "6 Clicks" Mobility   Outcome Measure  Help needed turning from your back to your side while in a flat bed without using bedrails?: None Help needed moving from lying on your back to sitting on the side of a flat bed without using bedrails?: None Help needed moving to and from a bed to a chair (including a wheelchair)?: A Little Help needed standing up from a chair using your arms (e.g., wheelchair or bedside chair)?: A Little Help needed to walk in hospital room?: A Little Help needed climbing 3-5 steps with a railing? : A Little 6 Click Score: 20    End of Session Equipment Utilized During Treatment: Gait belt Activity Tolerance: Patient limited by fatigue Patient left: with call bell/phone within reach;in bed;with bed alarm set Nurse Communication: Mobility status PT Visit Diagnosis: Muscle weakness (generalized) (M62.81)     Time: [id number] PT Time Calculation (min) (ACUTE ONLY): 17 min  Charges:  $Gait Training: 8-22 mins                     Waller Marcussen W,PT Acute Rehabilitation Services Pager:  [phone]  Office:  [place] 08/15/2019, 1:13 PM

## 2019-08-15 NOTE — Progress Notes (Signed)
Occupational Therapy Treatment Patient Details Name: Paul Wood. MRN: [id number] DOB: 1931/09/26 Today's Date: 08/15/2019    History of present illness 86 yo male present to ED with difficulty breathing and cough. VQ was negative for Pulmonary embolus. PMH including DM, HTN, CAD, CKD3, HTN, and depression.   OT comments  Pt making gradual progress towards OT goals. Pt found seated EOB (towards bottom of bed as bed rail still raised and with bed alarm sounding), agreeable to working with OT to get up to recliner. Overall pt requiring close minguard - minA for functional transfers using RW as well as cues for safety when navigating towards recliner with RW. Pt remains with impaired cognition and balance, VSS on RA during session. Feel POC remains appropriate at this time pending pt has hands on 24hr assistance at home. Will continue to follow acutely.    Follow Up Recommendations  Home health OT;Supervision/Assistance - 24 hour    Equipment Recommendations  None recommended by OT          Precautions / Restrictions Precautions Precautions: Fall Restrictions Weight Bearing Restrictions: No       Mobility Bed Mobility Overal bed mobility: Needs Assistance Bed Mobility: Sit to Supine;Supine to Sit     Supine to sit: Supervision Sit to supine: Supervision   General bed mobility comments: pt found sitting EOB upon arrival  Transfers Overall transfer level: Needs assistance Equipment used: Rolling walker (2 wheeled) Transfers: Sit to/from Stand Sit to Stand: Min guard         General transfer comment: Min Guard A for safety. cues for hand placement    Balance Overall balance assessment: Needs assistance Sitting-balance support: No upper extremity supported;Feet supported Sitting balance-Leahy Scale: Good     Standing balance support: No upper extremity supported;During functional activity Standing balance-Leahy Scale: Fair Standing balance comment: Able to stand  at sink without UE to perform oral care                           ADL either performed or assessed with clinical judgement   ADL Overall ADL's : Needs assistance/impaired                                     Functional mobility during ADLs: Minimal assistance;Min guard;Rolling walker General ADL Comments: pt continues to present with impaired balance and cognition, delcines mobility beyond bed to chair but is agreeable to getting up to recliner during session                       Cognition Arousal/Alertness: Awake/alert Behavior During Therapy: Flat affect;Impulsive Overall Cognitive Status: No family/caregiver present to determine baseline cognitive functioning Area of Impairment: Attention;Memory;Following commands;Safety/judgement;Awareness;Problem solving                   Current Attention Level: Sustained Memory: Decreased short-term memory Following Commands: Follows one step commands inconsistently;Follows one step commands with increased time Safety/Judgement: Decreased awareness of safety;Decreased awareness of deficits Awareness: Emergent;Intellectual Problem Solving: Slow processing;Requires verbal cues;Difficulty sequencing General Comments: pt received in room seated on EOB (towards bottom of bed as bedrail still up) with bed alarm sounding, reports he wanted to sit up and look out the window; requires cues for safety throughout mobility tasks         Exercises     Shoulder Instructions  General Comments VSS on RA    Pertinent Vitals/ Pain       Pain Assessment: No/denies pain  Home Living                                          Prior Functioning/Environment              Frequency  Min 3X/week        Progress Toward Goals  OT Goals(current goals can now be found in the care plan section)  Progress towards OT goals: Progressing toward goals  Acute Rehab OT Goals Patient Stated  Goal: "Go home" OT Goal Formulation: With patient Time For Goal Achievement: 08/26/19 Potential to Achieve Goals: Good ADL Goals Pt Will Perform Grooming: with modified independence;standing Pt Will Perform Lower Body Dressing: with modified independence;sit to/from stand Pt Will Transfer to Toilet: with modified independence;ambulating;regular height toilet Pt Will Perform Toileting - Clothing Manipulation and hygiene: with modified independence;sitting/lateral leans;sit to/from stand Pt Will Perform Tub/Shower Transfer: Tub transfer;ambulating;shower seat;with min guard assist;rolling walker  Plan Discharge plan remains appropriate    Co-evaluation                 AM-PAC OT "6 Clicks" Daily Activity     Outcome Measure   Help from another person eating meals?: None Help from another person taking care of personal grooming?: A Little Help from another person toileting, which includes using toliet, bedpan, or urinal?: A Little Help from another person bathing (including washing, rinsing, drying)?: A Little Help from another person to put on and taking off regular upper body clothing?: A Little Help from another person to put on and taking off regular lower body clothing?: A Little 6 Click Score: 19    End of Session Equipment Utilized During Treatment: Rolling walker  OT Visit Diagnosis: Unsteadiness on feet (R26.81);Other abnormalities of gait and mobility (R26.89);Muscle weakness (generalized) (M62.81)   Activity Tolerance Patient tolerated treatment well   Patient Left in chair;with call bell/phone within reach;with chair alarm set   Nurse Communication Mobility status        Time: [id number] OT Time Calculation (min): 16 min  Charges: OT General Charges $OT Visit: 1 Visit OT Treatments $Self Care/Home Management : 8-22 mins  Paul Wood, OT Supplemental Rehabilitation Services Pager [phone] Office [phone]   Raymondo Band 08/15/2019,  2:05 PM

## 2019-08-15 NOTE — Progress Notes (Signed)
Paul Wood. to be D/C'd Home per MD order.  Discussed with the patient and all questions fully answered.  VSS, Skin clean, dry and intact without evidence of skin break down, no evidence of skin tears noted. IV catheter discontinued intact. Site without signs and symptoms of complications. Dressing and pressure applied.  An After Visit Summary was printed and given to the patient.   D/c education completed with patient/family including follow up instructions, medication list, d/c activities limitations if indicated, with other d/c instructions as indicated by MD - patient able to verbalize understanding, all questions fully answered.   Patient instructed to return to ED, call 911, or call MD for any changes in condition.   Patient escorted via [place], and D/C home via private auto.  [name] 08/15/2019 2:45 PM

## 2019-08-15 NOTE — Care Management Important Message (Signed)
Important Message  Patient Details  Name: Paul Wood. MRN: [id number] Date of Birth: January 07, 1932   Medicare Important Message Given:  Yes - Important Message mailed due to current National Emergency  Verbal consent obtained due to current National Emergency  Relationship to patient: Child Contact Name: [name] Call Date: 08/15/19  Time: 1040 Phone: [phone] Outcome: Spoke with contact Important Message mailed to: Emergency contact on file    [name] 08/15/2019, 10:44 AM

## 2019-08-15 NOTE — Care Management (Addendum)
Pt deemed appropriate for discharge home today.  Pt only alert to self and place.  CM confirmed with pts daughter that family will provide the recomemnded 24 hour supervision at discharge. Pt set up with [facility] for [facility] on yesterday.  No other TOC needs determined - CM signing off

## 2019-08-26 DIAGNOSIS — N183 Chronic kidney disease, stage 3 unspecified: Secondary | ICD-10-CM | POA: Diagnosis not present

## 2019-08-26 DIAGNOSIS — H409 Unspecified glaucoma: Secondary | ICD-10-CM | POA: Diagnosis not present

## 2019-08-26 DIAGNOSIS — U071 COVID-19: Secondary | ICD-10-CM | POA: Diagnosis not present

## 2019-08-26 DIAGNOSIS — J440 Chronic obstructive pulmonary disease with acute lower respiratory infection: Secondary | ICD-10-CM | POA: Diagnosis not present

## 2019-08-26 DIAGNOSIS — J1282 Pneumonia due to coronavirus disease 2019: Secondary | ICD-10-CM | POA: Diagnosis not present

## 2019-08-26 DIAGNOSIS — I2510 Atherosclerotic heart disease of native coronary artery without angina pectoris: Secondary | ICD-10-CM | POA: Diagnosis not present

## 2019-08-26 DIAGNOSIS — D509 Iron deficiency anemia, unspecified: Secondary | ICD-10-CM | POA: Diagnosis not present

## 2019-08-26 DIAGNOSIS — I129 Hypertensive chronic kidney disease with stage 1 through stage 4 chronic kidney disease, or unspecified chronic kidney disease: Secondary | ICD-10-CM | POA: Diagnosis not present

## 2019-08-26 DIAGNOSIS — E1122 Type 2 diabetes mellitus with diabetic chronic kidney disease: Secondary | ICD-10-CM | POA: Diagnosis not present

## 2019-08-26 DIAGNOSIS — E1151 Type 2 diabetes mellitus with diabetic peripheral angiopathy without gangrene: Secondary | ICD-10-CM | POA: Diagnosis not present

## 2019-08-31 DIAGNOSIS — D509 Iron deficiency anemia, unspecified: Secondary | ICD-10-CM | POA: Diagnosis not present

## 2019-08-31 DIAGNOSIS — J1282 Pneumonia due to coronavirus disease 2019: Secondary | ICD-10-CM | POA: Diagnosis not present

## 2019-08-31 DIAGNOSIS — H409 Unspecified glaucoma: Secondary | ICD-10-CM | POA: Diagnosis not present

## 2019-08-31 DIAGNOSIS — I129 Hypertensive chronic kidney disease with stage 1 through stage 4 chronic kidney disease, or unspecified chronic kidney disease: Secondary | ICD-10-CM | POA: Diagnosis not present

## 2019-08-31 DIAGNOSIS — N183 Chronic kidney disease, stage 3 unspecified: Secondary | ICD-10-CM | POA: Diagnosis not present

## 2019-08-31 DIAGNOSIS — J440 Chronic obstructive pulmonary disease with acute lower respiratory infection: Secondary | ICD-10-CM | POA: Diagnosis not present

## 2019-08-31 DIAGNOSIS — U071 COVID-19: Secondary | ICD-10-CM | POA: Diagnosis not present

## 2019-08-31 DIAGNOSIS — E1151 Type 2 diabetes mellitus with diabetic peripheral angiopathy without gangrene: Secondary | ICD-10-CM | POA: Diagnosis not present

## 2019-08-31 DIAGNOSIS — I2510 Atherosclerotic heart disease of native coronary artery without angina pectoris: Secondary | ICD-10-CM | POA: Diagnosis not present

## 2019-08-31 DIAGNOSIS — E1122 Type 2 diabetes mellitus with diabetic chronic kidney disease: Secondary | ICD-10-CM | POA: Diagnosis not present

## 2019-09-01 DIAGNOSIS — J440 Chronic obstructive pulmonary disease with acute lower respiratory infection: Secondary | ICD-10-CM | POA: Diagnosis not present

## 2019-09-01 DIAGNOSIS — D509 Iron deficiency anemia, unspecified: Secondary | ICD-10-CM | POA: Diagnosis not present

## 2019-09-01 DIAGNOSIS — E1122 Type 2 diabetes mellitus with diabetic chronic kidney disease: Secondary | ICD-10-CM | POA: Diagnosis not present

## 2019-09-01 DIAGNOSIS — N183 Chronic kidney disease, stage 3 unspecified: Secondary | ICD-10-CM | POA: Diagnosis not present

## 2019-09-01 DIAGNOSIS — U071 COVID-19: Secondary | ICD-10-CM | POA: Diagnosis not present

## 2019-09-01 DIAGNOSIS — H409 Unspecified glaucoma: Secondary | ICD-10-CM | POA: Diagnosis not present

## 2019-09-01 DIAGNOSIS — J1282 Pneumonia due to coronavirus disease 2019: Secondary | ICD-10-CM | POA: Diagnosis not present

## 2019-09-01 DIAGNOSIS — I2510 Atherosclerotic heart disease of native coronary artery without angina pectoris: Secondary | ICD-10-CM | POA: Diagnosis not present

## 2019-09-01 DIAGNOSIS — E1151 Type 2 diabetes mellitus with diabetic peripheral angiopathy without gangrene: Secondary | ICD-10-CM | POA: Diagnosis not present

## 2019-09-01 DIAGNOSIS — I129 Hypertensive chronic kidney disease with stage 1 through stage 4 chronic kidney disease, or unspecified chronic kidney disease: Secondary | ICD-10-CM | POA: Diagnosis not present

## 2019-09-02 DIAGNOSIS — E1151 Type 2 diabetes mellitus with diabetic peripheral angiopathy without gangrene: Secondary | ICD-10-CM | POA: Diagnosis not present

## 2019-09-02 DIAGNOSIS — N183 Chronic kidney disease, stage 3 unspecified: Secondary | ICD-10-CM | POA: Diagnosis not present

## 2019-09-02 DIAGNOSIS — I2510 Atherosclerotic heart disease of native coronary artery without angina pectoris: Secondary | ICD-10-CM | POA: Diagnosis not present

## 2019-09-02 DIAGNOSIS — E1122 Type 2 diabetes mellitus with diabetic chronic kidney disease: Secondary | ICD-10-CM | POA: Diagnosis not present

## 2019-09-02 DIAGNOSIS — I129 Hypertensive chronic kidney disease with stage 1 through stage 4 chronic kidney disease, or unspecified chronic kidney disease: Secondary | ICD-10-CM | POA: Diagnosis not present

## 2019-09-02 DIAGNOSIS — D509 Iron deficiency anemia, unspecified: Secondary | ICD-10-CM | POA: Diagnosis not present

## 2019-09-02 DIAGNOSIS — U071 COVID-19: Secondary | ICD-10-CM | POA: Diagnosis not present

## 2019-09-02 DIAGNOSIS — J1282 Pneumonia due to coronavirus disease 2019: Secondary | ICD-10-CM | POA: Diagnosis not present

## 2019-09-02 DIAGNOSIS — H409 Unspecified glaucoma: Secondary | ICD-10-CM | POA: Diagnosis not present

## 2019-09-02 DIAGNOSIS — J440 Chronic obstructive pulmonary disease with acute lower respiratory infection: Secondary | ICD-10-CM | POA: Diagnosis not present

## 2019-09-06 DIAGNOSIS — I2510 Atherosclerotic heart disease of native coronary artery without angina pectoris: Secondary | ICD-10-CM | POA: Diagnosis not present

## 2019-09-06 DIAGNOSIS — U071 COVID-19: Secondary | ICD-10-CM | POA: Diagnosis not present

## 2019-09-06 DIAGNOSIS — E1151 Type 2 diabetes mellitus with diabetic peripheral angiopathy without gangrene: Secondary | ICD-10-CM | POA: Diagnosis not present

## 2019-09-06 DIAGNOSIS — D509 Iron deficiency anemia, unspecified: Secondary | ICD-10-CM | POA: Diagnosis not present

## 2019-09-06 DIAGNOSIS — N183 Chronic kidney disease, stage 3 unspecified: Secondary | ICD-10-CM | POA: Diagnosis not present

## 2019-09-06 DIAGNOSIS — J440 Chronic obstructive pulmonary disease with acute lower respiratory infection: Secondary | ICD-10-CM | POA: Diagnosis not present

## 2019-09-06 DIAGNOSIS — E1122 Type 2 diabetes mellitus with diabetic chronic kidney disease: Secondary | ICD-10-CM | POA: Diagnosis not present

## 2019-09-06 DIAGNOSIS — J1282 Pneumonia due to coronavirus disease 2019: Secondary | ICD-10-CM | POA: Diagnosis not present

## 2019-09-06 DIAGNOSIS — H409 Unspecified glaucoma: Secondary | ICD-10-CM | POA: Diagnosis not present

## 2019-09-06 DIAGNOSIS — I129 Hypertensive chronic kidney disease with stage 1 through stage 4 chronic kidney disease, or unspecified chronic kidney disease: Secondary | ICD-10-CM | POA: Diagnosis not present

## 2019-09-08 DIAGNOSIS — D509 Iron deficiency anemia, unspecified: Secondary | ICD-10-CM | POA: Diagnosis not present

## 2019-09-08 DIAGNOSIS — U071 COVID-19: Secondary | ICD-10-CM | POA: Diagnosis not present

## 2019-09-08 DIAGNOSIS — I2510 Atherosclerotic heart disease of native coronary artery without angina pectoris: Secondary | ICD-10-CM | POA: Diagnosis not present

## 2019-09-08 DIAGNOSIS — J440 Chronic obstructive pulmonary disease with acute lower respiratory infection: Secondary | ICD-10-CM | POA: Diagnosis not present

## 2019-09-08 DIAGNOSIS — E1151 Type 2 diabetes mellitus with diabetic peripheral angiopathy without gangrene: Secondary | ICD-10-CM | POA: Diagnosis not present

## 2019-09-08 DIAGNOSIS — H409 Unspecified glaucoma: Secondary | ICD-10-CM | POA: Diagnosis not present

## 2019-09-08 DIAGNOSIS — E1122 Type 2 diabetes mellitus with diabetic chronic kidney disease: Secondary | ICD-10-CM | POA: Diagnosis not present

## 2019-09-08 DIAGNOSIS — N183 Chronic kidney disease, stage 3 unspecified: Secondary | ICD-10-CM | POA: Diagnosis not present

## 2019-09-08 DIAGNOSIS — J1282 Pneumonia due to coronavirus disease 2019: Secondary | ICD-10-CM | POA: Diagnosis not present

## 2019-09-08 DIAGNOSIS — I129 Hypertensive chronic kidney disease with stage 1 through stage 4 chronic kidney disease, or unspecified chronic kidney disease: Secondary | ICD-10-CM | POA: Diagnosis not present

## 2019-09-09 DIAGNOSIS — I129 Hypertensive chronic kidney disease with stage 1 through stage 4 chronic kidney disease, or unspecified chronic kidney disease: Secondary | ICD-10-CM | POA: Diagnosis not present

## 2019-09-09 DIAGNOSIS — N183 Chronic kidney disease, stage 3 unspecified: Secondary | ICD-10-CM | POA: Diagnosis not present

## 2019-09-09 DIAGNOSIS — E1122 Type 2 diabetes mellitus with diabetic chronic kidney disease: Secondary | ICD-10-CM | POA: Diagnosis not present

## 2019-09-09 DIAGNOSIS — I2510 Atherosclerotic heart disease of native coronary artery without angina pectoris: Secondary | ICD-10-CM | POA: Diagnosis not present

## 2019-09-09 DIAGNOSIS — J1282 Pneumonia due to coronavirus disease 2019: Secondary | ICD-10-CM | POA: Diagnosis not present

## 2019-09-09 DIAGNOSIS — D509 Iron deficiency anemia, unspecified: Secondary | ICD-10-CM | POA: Diagnosis not present

## 2019-09-09 DIAGNOSIS — H409 Unspecified glaucoma: Secondary | ICD-10-CM | POA: Diagnosis not present

## 2019-09-09 DIAGNOSIS — J440 Chronic obstructive pulmonary disease with acute lower respiratory infection: Secondary | ICD-10-CM | POA: Diagnosis not present

## 2019-09-09 DIAGNOSIS — U071 COVID-19: Secondary | ICD-10-CM | POA: Diagnosis not present

## 2019-09-09 DIAGNOSIS — E1151 Type 2 diabetes mellitus with diabetic peripheral angiopathy without gangrene: Secondary | ICD-10-CM | POA: Diagnosis not present

## 2019-09-09 DIAGNOSIS — R69 Illness, unspecified: Secondary | ICD-10-CM | POA: Diagnosis not present

## 2019-09-12 ENCOUNTER — Other Ambulatory Visit: Payer: Self-pay

## 2019-09-12 ENCOUNTER — Ambulatory Visit (INDEPENDENT_AMBULATORY_CARE_PROVIDER_SITE_OTHER): Payer: Medicare HMO | Admitting: Podiatry

## 2019-09-12 DIAGNOSIS — E1140 Type 2 diabetes mellitus with diabetic neuropathy, unspecified: Secondary | ICD-10-CM | POA: Diagnosis not present

## 2019-09-12 DIAGNOSIS — B351 Tinea unguium: Secondary | ICD-10-CM

## 2019-09-12 DIAGNOSIS — I5032 Chronic diastolic (congestive) heart failure: Secondary | ICD-10-CM | POA: Diagnosis not present

## 2019-09-12 DIAGNOSIS — I10 Essential (primary) hypertension: Secondary | ICD-10-CM | POA: Diagnosis not present

## 2019-09-12 DIAGNOSIS — M2041 Other hammer toe(s) (acquired), right foot: Secondary | ICD-10-CM

## 2019-09-12 DIAGNOSIS — M2042 Other hammer toe(s) (acquired), left foot: Secondary | ICD-10-CM | POA: Diagnosis not present

## 2019-09-12 DIAGNOSIS — E039 Hypothyroidism, unspecified: Secondary | ICD-10-CM | POA: Diagnosis not present

## 2019-09-12 DIAGNOSIS — I5033 Acute on chronic diastolic (congestive) heart failure: Secondary | ICD-10-CM | POA: Diagnosis not present

## 2019-09-12 DIAGNOSIS — Z794 Long term (current) use of insulin: Secondary | ICD-10-CM | POA: Diagnosis not present

## 2019-09-12 DIAGNOSIS — E1165 Type 2 diabetes mellitus with hyperglycemia: Secondary | ICD-10-CM | POA: Diagnosis not present

## 2019-09-12 DIAGNOSIS — E1169 Type 2 diabetes mellitus with other specified complication: Secondary | ICD-10-CM | POA: Diagnosis not present

## 2019-09-12 DIAGNOSIS — E782 Mixed hyperlipidemia: Secondary | ICD-10-CM | POA: Diagnosis not present

## 2019-09-12 DIAGNOSIS — E1142 Type 2 diabetes mellitus with diabetic polyneuropathy: Secondary | ICD-10-CM

## 2019-09-12 DIAGNOSIS — E1122 Type 2 diabetes mellitus with diabetic chronic kidney disease: Secondary | ICD-10-CM | POA: Diagnosis not present

## 2019-09-12 DIAGNOSIS — N185 Chronic kidney disease, stage 5: Secondary | ICD-10-CM | POA: Diagnosis not present

## 2019-09-12 DIAGNOSIS — D689 Coagulation defect, unspecified: Secondary | ICD-10-CM

## 2019-09-12 NOTE — Progress Notes (Signed)
  Subjective:  Patient ID: Paul Picket., male    DOB: 03/14/1932,  MRN: [id number]  Chief Complaint  Patient presents with  . debride    diabetic nail trimming  . Diabetes    FBS: 200 A1C: na PCP: [facility]    86 y.o. male presents with the above complaint. History confirmed with patient.   Objective:  Physical Exam: warm, good capillary refill, nail exam onychomycosis of the toenails, no trophic changes or ulcerative lesions. DP pulses palpable, PT pulses palpable, protective sensation absent and vibratory sensation diminished hammertoes bilat   Assessment:   1. Onychomycosis of multiple toenails with type 2 diabetes mellitus and peripheral neuropathy (HCC)   2. Coagulation defect (HCC)   3. Hammer toes of both feet      Plan:  Patient was evaluated and treated and all questions answered.  Onychomycosis, Diabetes, DPN and Coagulation Defect -Patient is diabetic with a qualifying condition for at risk foot care. -Would benefit from DM shoes. Will make appt for fabrication -Educated on DM Footcare.  Procedure: Nail Debridement Rationale: Patient meets criteria for routine foot care due to DPN, coag defect Type of Debridement: manual, sharp debridement. Instrumentation: Nail nipper, rotary burr. Number of Nails: 10  No follow-ups on file.

## 2019-09-13 DIAGNOSIS — I129 Hypertensive chronic kidney disease with stage 1 through stage 4 chronic kidney disease, or unspecified chronic kidney disease: Secondary | ICD-10-CM | POA: Diagnosis not present

## 2019-09-13 DIAGNOSIS — N183 Chronic kidney disease, stage 3 unspecified: Secondary | ICD-10-CM | POA: Diagnosis not present

## 2019-09-13 DIAGNOSIS — U071 COVID-19: Secondary | ICD-10-CM | POA: Diagnosis not present

## 2019-09-13 DIAGNOSIS — E1151 Type 2 diabetes mellitus with diabetic peripheral angiopathy without gangrene: Secondary | ICD-10-CM | POA: Diagnosis not present

## 2019-09-13 DIAGNOSIS — I2510 Atherosclerotic heart disease of native coronary artery without angina pectoris: Secondary | ICD-10-CM | POA: Diagnosis not present

## 2019-09-13 DIAGNOSIS — D509 Iron deficiency anemia, unspecified: Secondary | ICD-10-CM | POA: Diagnosis not present

## 2019-09-13 DIAGNOSIS — J1282 Pneumonia due to coronavirus disease 2019: Secondary | ICD-10-CM | POA: Diagnosis not present

## 2019-09-13 DIAGNOSIS — E1122 Type 2 diabetes mellitus with diabetic chronic kidney disease: Secondary | ICD-10-CM | POA: Diagnosis not present

## 2019-09-13 DIAGNOSIS — J440 Chronic obstructive pulmonary disease with acute lower respiratory infection: Secondary | ICD-10-CM | POA: Diagnosis not present

## 2019-09-13 DIAGNOSIS — H409 Unspecified glaucoma: Secondary | ICD-10-CM | POA: Diagnosis not present

## 2019-09-14 DIAGNOSIS — J9601 Acute respiratory failure with hypoxia: Secondary | ICD-10-CM | POA: Diagnosis not present

## 2019-09-14 DIAGNOSIS — E1140 Type 2 diabetes mellitus with diabetic neuropathy, unspecified: Secondary | ICD-10-CM | POA: Diagnosis not present

## 2019-09-14 DIAGNOSIS — N179 Acute kidney failure, unspecified: Secondary | ICD-10-CM | POA: Diagnosis not present

## 2019-09-14 DIAGNOSIS — U071 COVID-19: Secondary | ICD-10-CM | POA: Diagnosis not present

## 2019-09-14 DIAGNOSIS — E1122 Type 2 diabetes mellitus with diabetic chronic kidney disease: Secondary | ICD-10-CM | POA: Diagnosis not present

## 2019-09-14 DIAGNOSIS — N184 Chronic kidney disease, stage 4 (severe): Secondary | ICD-10-CM | POA: Diagnosis not present

## 2019-09-14 DIAGNOSIS — J1282 Pneumonia due to coronavirus disease 2019: Secondary | ICD-10-CM | POA: Diagnosis not present

## 2019-09-14 DIAGNOSIS — Z6833 Body mass index (BMI) 33.0-33.9, adult: Secondary | ICD-10-CM | POA: Diagnosis not present

## 2019-09-15 DIAGNOSIS — E1122 Type 2 diabetes mellitus with diabetic chronic kidney disease: Secondary | ICD-10-CM | POA: Diagnosis not present

## 2019-09-15 DIAGNOSIS — J1282 Pneumonia due to coronavirus disease 2019: Secondary | ICD-10-CM | POA: Diagnosis not present

## 2019-09-15 DIAGNOSIS — J9601 Acute respiratory failure with hypoxia: Secondary | ICD-10-CM | POA: Diagnosis not present

## 2019-09-15 DIAGNOSIS — I10 Essential (primary) hypertension: Secondary | ICD-10-CM | POA: Diagnosis not present

## 2019-09-15 DIAGNOSIS — I5032 Chronic diastolic (congestive) heart failure: Secondary | ICD-10-CM | POA: Diagnosis not present

## 2019-09-15 DIAGNOSIS — E871 Hypo-osmolality and hyponatremia: Secondary | ICD-10-CM | POA: Diagnosis not present

## 2019-09-15 DIAGNOSIS — R69 Illness, unspecified: Secondary | ICD-10-CM | POA: Diagnosis not present

## 2019-09-15 DIAGNOSIS — E1140 Type 2 diabetes mellitus with diabetic neuropathy, unspecified: Secondary | ICD-10-CM | POA: Diagnosis not present

## 2019-09-15 DIAGNOSIS — D509 Iron deficiency anemia, unspecified: Secondary | ICD-10-CM | POA: Diagnosis not present

## 2019-09-16 DIAGNOSIS — I129 Hypertensive chronic kidney disease with stage 1 through stage 4 chronic kidney disease, or unspecified chronic kidney disease: Secondary | ICD-10-CM | POA: Diagnosis not present

## 2019-09-16 DIAGNOSIS — D509 Iron deficiency anemia, unspecified: Secondary | ICD-10-CM | POA: Diagnosis not present

## 2019-09-16 DIAGNOSIS — J1282 Pneumonia due to coronavirus disease 2019: Secondary | ICD-10-CM | POA: Diagnosis not present

## 2019-09-16 DIAGNOSIS — H409 Unspecified glaucoma: Secondary | ICD-10-CM | POA: Diagnosis not present

## 2019-09-16 DIAGNOSIS — N183 Chronic kidney disease, stage 3 unspecified: Secondary | ICD-10-CM | POA: Diagnosis not present

## 2019-09-16 DIAGNOSIS — J440 Chronic obstructive pulmonary disease with acute lower respiratory infection: Secondary | ICD-10-CM | POA: Diagnosis not present

## 2019-09-16 DIAGNOSIS — E1122 Type 2 diabetes mellitus with diabetic chronic kidney disease: Secondary | ICD-10-CM | POA: Diagnosis not present

## 2019-09-16 DIAGNOSIS — U071 COVID-19: Secondary | ICD-10-CM | POA: Diagnosis not present

## 2019-09-16 DIAGNOSIS — E1151 Type 2 diabetes mellitus with diabetic peripheral angiopathy without gangrene: Secondary | ICD-10-CM | POA: Diagnosis not present

## 2019-09-16 DIAGNOSIS — I2510 Atherosclerotic heart disease of native coronary artery without angina pectoris: Secondary | ICD-10-CM | POA: Diagnosis not present

## 2019-09-19 ENCOUNTER — Other Ambulatory Visit: Payer: Self-pay

## 2019-09-19 ENCOUNTER — Ambulatory Visit: Payer: Medicare HMO | Admitting: Orthotics

## 2019-09-19 DIAGNOSIS — J440 Chronic obstructive pulmonary disease with acute lower respiratory infection: Secondary | ICD-10-CM | POA: Diagnosis not present

## 2019-09-19 DIAGNOSIS — I129 Hypertensive chronic kidney disease with stage 1 through stage 4 chronic kidney disease, or unspecified chronic kidney disease: Secondary | ICD-10-CM | POA: Diagnosis not present

## 2019-09-19 DIAGNOSIS — D689 Coagulation defect, unspecified: Secondary | ICD-10-CM

## 2019-09-19 DIAGNOSIS — H409 Unspecified glaucoma: Secondary | ICD-10-CM | POA: Diagnosis not present

## 2019-09-19 DIAGNOSIS — M2041 Other hammer toe(s) (acquired), right foot: Secondary | ICD-10-CM

## 2019-09-19 DIAGNOSIS — D509 Iron deficiency anemia, unspecified: Secondary | ICD-10-CM | POA: Diagnosis not present

## 2019-09-19 DIAGNOSIS — N183 Chronic kidney disease, stage 3 unspecified: Secondary | ICD-10-CM | POA: Diagnosis not present

## 2019-09-19 DIAGNOSIS — E1169 Type 2 diabetes mellitus with other specified complication: Secondary | ICD-10-CM

## 2019-09-19 DIAGNOSIS — E1122 Type 2 diabetes mellitus with diabetic chronic kidney disease: Secondary | ICD-10-CM | POA: Diagnosis not present

## 2019-09-19 DIAGNOSIS — E1142 Type 2 diabetes mellitus with diabetic polyneuropathy: Secondary | ICD-10-CM

## 2019-09-19 DIAGNOSIS — J1282 Pneumonia due to coronavirus disease 2019: Secondary | ICD-10-CM | POA: Diagnosis not present

## 2019-09-19 DIAGNOSIS — M2042 Other hammer toe(s) (acquired), left foot: Secondary | ICD-10-CM

## 2019-09-19 DIAGNOSIS — U071 COVID-19: Secondary | ICD-10-CM | POA: Diagnosis not present

## 2019-09-19 DIAGNOSIS — E1151 Type 2 diabetes mellitus with diabetic peripheral angiopathy without gangrene: Secondary | ICD-10-CM | POA: Diagnosis not present

## 2019-09-19 DIAGNOSIS — I2510 Atherosclerotic heart disease of native coronary artery without angina pectoris: Secondary | ICD-10-CM | POA: Diagnosis not present

## 2019-09-19 DIAGNOSIS — B351 Tinea unguium: Secondary | ICD-10-CM

## 2019-09-19 NOTE — Progress Notes (Signed)

## 2019-09-21 DIAGNOSIS — U071 COVID-19: Secondary | ICD-10-CM | POA: Diagnosis not present

## 2019-09-21 DIAGNOSIS — H409 Unspecified glaucoma: Secondary | ICD-10-CM | POA: Diagnosis not present

## 2019-09-21 DIAGNOSIS — E1151 Type 2 diabetes mellitus with diabetic peripheral angiopathy without gangrene: Secondary | ICD-10-CM | POA: Diagnosis not present

## 2019-09-21 DIAGNOSIS — D509 Iron deficiency anemia, unspecified: Secondary | ICD-10-CM | POA: Diagnosis not present

## 2019-09-21 DIAGNOSIS — E1122 Type 2 diabetes mellitus with diabetic chronic kidney disease: Secondary | ICD-10-CM | POA: Diagnosis not present

## 2019-09-21 DIAGNOSIS — J1282 Pneumonia due to coronavirus disease 2019: Secondary | ICD-10-CM | POA: Diagnosis not present

## 2019-09-21 DIAGNOSIS — N183 Chronic kidney disease, stage 3 unspecified: Secondary | ICD-10-CM | POA: Diagnosis not present

## 2019-09-21 DIAGNOSIS — I129 Hypertensive chronic kidney disease with stage 1 through stage 4 chronic kidney disease, or unspecified chronic kidney disease: Secondary | ICD-10-CM | POA: Diagnosis not present

## 2019-09-21 DIAGNOSIS — J440 Chronic obstructive pulmonary disease with acute lower respiratory infection: Secondary | ICD-10-CM | POA: Diagnosis not present

## 2019-09-21 DIAGNOSIS — I2510 Atherosclerotic heart disease of native coronary artery without angina pectoris: Secondary | ICD-10-CM | POA: Diagnosis not present

## 2019-09-26 DIAGNOSIS — I2510 Atherosclerotic heart disease of native coronary artery without angina pectoris: Secondary | ICD-10-CM | POA: Diagnosis not present

## 2019-09-26 DIAGNOSIS — I129 Hypertensive chronic kidney disease with stage 1 through stage 4 chronic kidney disease, or unspecified chronic kidney disease: Secondary | ICD-10-CM | POA: Diagnosis not present

## 2019-09-26 DIAGNOSIS — E1151 Type 2 diabetes mellitus with diabetic peripheral angiopathy without gangrene: Secondary | ICD-10-CM | POA: Diagnosis not present

## 2019-09-26 DIAGNOSIS — J1282 Pneumonia due to coronavirus disease 2019: Secondary | ICD-10-CM | POA: Diagnosis not present

## 2019-09-26 DIAGNOSIS — D509 Iron deficiency anemia, unspecified: Secondary | ICD-10-CM | POA: Diagnosis not present

## 2019-09-26 DIAGNOSIS — E1122 Type 2 diabetes mellitus with diabetic chronic kidney disease: Secondary | ICD-10-CM | POA: Diagnosis not present

## 2019-09-26 DIAGNOSIS — H409 Unspecified glaucoma: Secondary | ICD-10-CM | POA: Diagnosis not present

## 2019-09-26 DIAGNOSIS — U071 COVID-19: Secondary | ICD-10-CM | POA: Diagnosis not present

## 2019-09-26 DIAGNOSIS — N183 Chronic kidney disease, stage 3 unspecified: Secondary | ICD-10-CM | POA: Diagnosis not present

## 2019-09-26 DIAGNOSIS — J440 Chronic obstructive pulmonary disease with acute lower respiratory infection: Secondary | ICD-10-CM | POA: Diagnosis not present

## 2019-09-29 DIAGNOSIS — E1122 Type 2 diabetes mellitus with diabetic chronic kidney disease: Secondary | ICD-10-CM | POA: Diagnosis not present

## 2019-09-29 DIAGNOSIS — H409 Unspecified glaucoma: Secondary | ICD-10-CM | POA: Diagnosis not present

## 2019-09-29 DIAGNOSIS — I2510 Atherosclerotic heart disease of native coronary artery without angina pectoris: Secondary | ICD-10-CM | POA: Diagnosis not present

## 2019-09-29 DIAGNOSIS — J1282 Pneumonia due to coronavirus disease 2019: Secondary | ICD-10-CM | POA: Diagnosis not present

## 2019-09-29 DIAGNOSIS — J440 Chronic obstructive pulmonary disease with acute lower respiratory infection: Secondary | ICD-10-CM | POA: Diagnosis not present

## 2019-09-29 DIAGNOSIS — E1151 Type 2 diabetes mellitus with diabetic peripheral angiopathy without gangrene: Secondary | ICD-10-CM | POA: Diagnosis not present

## 2019-09-29 DIAGNOSIS — N183 Chronic kidney disease, stage 3 unspecified: Secondary | ICD-10-CM | POA: Diagnosis not present

## 2019-09-29 DIAGNOSIS — I129 Hypertensive chronic kidney disease with stage 1 through stage 4 chronic kidney disease, or unspecified chronic kidney disease: Secondary | ICD-10-CM | POA: Diagnosis not present

## 2019-09-29 DIAGNOSIS — U071 COVID-19: Secondary | ICD-10-CM | POA: Diagnosis not present

## 2019-09-29 DIAGNOSIS — D509 Iron deficiency anemia, unspecified: Secondary | ICD-10-CM | POA: Diagnosis not present

## 2019-10-04 DIAGNOSIS — I5032 Chronic diastolic (congestive) heart failure: Secondary | ICD-10-CM | POA: Diagnosis not present

## 2019-10-04 DIAGNOSIS — I10 Essential (primary) hypertension: Secondary | ICD-10-CM | POA: Diagnosis not present

## 2019-10-04 DIAGNOSIS — E039 Hypothyroidism, unspecified: Secondary | ICD-10-CM | POA: Diagnosis not present

## 2019-10-05 DIAGNOSIS — N184 Chronic kidney disease, stage 4 (severe): Secondary | ICD-10-CM | POA: Diagnosis not present

## 2019-10-05 DIAGNOSIS — Z6837 Body mass index (BMI) 37.0-37.9, adult: Secondary | ICD-10-CM | POA: Diagnosis not present

## 2019-10-05 DIAGNOSIS — E1140 Type 2 diabetes mellitus with diabetic neuropathy, unspecified: Secondary | ICD-10-CM | POA: Diagnosis not present

## 2019-10-05 DIAGNOSIS — I5033 Acute on chronic diastolic (congestive) heart failure: Secondary | ICD-10-CM | POA: Diagnosis not present

## 2019-10-05 DIAGNOSIS — J1282 Pneumonia due to coronavirus disease 2019: Secondary | ICD-10-CM | POA: Diagnosis not present

## 2019-10-05 DIAGNOSIS — E039 Hypothyroidism, unspecified: Secondary | ICD-10-CM | POA: Diagnosis not present

## 2019-10-05 DIAGNOSIS — E1122 Type 2 diabetes mellitus with diabetic chronic kidney disease: Secondary | ICD-10-CM | POA: Diagnosis not present

## 2019-10-06 DIAGNOSIS — I2510 Atherosclerotic heart disease of native coronary artery without angina pectoris: Secondary | ICD-10-CM | POA: Diagnosis not present

## 2019-10-06 DIAGNOSIS — J1282 Pneumonia due to coronavirus disease 2019: Secondary | ICD-10-CM | POA: Diagnosis not present

## 2019-10-06 DIAGNOSIS — N183 Chronic kidney disease, stage 3 unspecified: Secondary | ICD-10-CM | POA: Diagnosis not present

## 2019-10-06 DIAGNOSIS — E1122 Type 2 diabetes mellitus with diabetic chronic kidney disease: Secondary | ICD-10-CM | POA: Diagnosis not present

## 2019-10-06 DIAGNOSIS — E1151 Type 2 diabetes mellitus with diabetic peripheral angiopathy without gangrene: Secondary | ICD-10-CM | POA: Diagnosis not present

## 2019-10-06 DIAGNOSIS — U071 COVID-19: Secondary | ICD-10-CM | POA: Diagnosis not present

## 2019-10-06 DIAGNOSIS — I129 Hypertensive chronic kidney disease with stage 1 through stage 4 chronic kidney disease, or unspecified chronic kidney disease: Secondary | ICD-10-CM | POA: Diagnosis not present

## 2019-10-06 DIAGNOSIS — H409 Unspecified glaucoma: Secondary | ICD-10-CM | POA: Diagnosis not present

## 2019-10-06 DIAGNOSIS — D509 Iron deficiency anemia, unspecified: Secondary | ICD-10-CM | POA: Diagnosis not present

## 2019-10-06 DIAGNOSIS — J440 Chronic obstructive pulmonary disease with acute lower respiratory infection: Secondary | ICD-10-CM | POA: Diagnosis not present

## 2019-10-10 DIAGNOSIS — J1282 Pneumonia due to coronavirus disease 2019: Secondary | ICD-10-CM | POA: Diagnosis not present

## 2019-10-10 DIAGNOSIS — E1140 Type 2 diabetes mellitus with diabetic neuropathy, unspecified: Secondary | ICD-10-CM | POA: Diagnosis not present

## 2019-10-10 DIAGNOSIS — I5033 Acute on chronic diastolic (congestive) heart failure: Secondary | ICD-10-CM | POA: Diagnosis not present

## 2019-10-10 DIAGNOSIS — N184 Chronic kidney disease, stage 4 (severe): Secondary | ICD-10-CM | POA: Diagnosis not present

## 2019-10-10 DIAGNOSIS — E1122 Type 2 diabetes mellitus with diabetic chronic kidney disease: Secondary | ICD-10-CM | POA: Diagnosis not present

## 2019-10-10 DIAGNOSIS — Z6837 Body mass index (BMI) 37.0-37.9, adult: Secondary | ICD-10-CM | POA: Diagnosis not present

## 2019-10-10 DIAGNOSIS — E039 Hypothyroidism, unspecified: Secondary | ICD-10-CM | POA: Diagnosis not present

## 2019-10-10 DIAGNOSIS — Z6835 Body mass index (BMI) 35.0-35.9, adult: Secondary | ICD-10-CM | POA: Diagnosis not present

## 2019-10-14 DIAGNOSIS — R0602 Shortness of breath: Secondary | ICD-10-CM | POA: Diagnosis not present

## 2019-10-14 DIAGNOSIS — R600 Localized edema: Secondary | ICD-10-CM | POA: Diagnosis not present

## 2019-10-14 DIAGNOSIS — I5033 Acute on chronic diastolic (congestive) heart failure: Secondary | ICD-10-CM | POA: Diagnosis not present

## 2019-10-14 DIAGNOSIS — I5032 Chronic diastolic (congestive) heart failure: Secondary | ICD-10-CM | POA: Diagnosis not present

## 2019-10-17 DIAGNOSIS — Z6837 Body mass index (BMI) 37.0-37.9, adult: Secondary | ICD-10-CM | POA: Diagnosis not present

## 2019-10-17 DIAGNOSIS — Z6835 Body mass index (BMI) 35.0-35.9, adult: Secondary | ICD-10-CM | POA: Diagnosis not present

## 2019-10-17 DIAGNOSIS — I5033 Acute on chronic diastolic (congestive) heart failure: Secondary | ICD-10-CM | POA: Diagnosis not present

## 2019-10-17 DIAGNOSIS — J1282 Pneumonia due to coronavirus disease 2019: Secondary | ICD-10-CM | POA: Diagnosis not present

## 2019-10-17 DIAGNOSIS — E039 Hypothyroidism, unspecified: Secondary | ICD-10-CM | POA: Diagnosis not present

## 2019-10-17 DIAGNOSIS — E1122 Type 2 diabetes mellitus with diabetic chronic kidney disease: Secondary | ICD-10-CM | POA: Diagnosis not present

## 2019-10-17 DIAGNOSIS — N184 Chronic kidney disease, stage 4 (severe): Secondary | ICD-10-CM | POA: Diagnosis not present

## 2019-10-17 DIAGNOSIS — Z6836 Body mass index (BMI) 36.0-36.9, adult: Secondary | ICD-10-CM | POA: Diagnosis not present

## 2019-10-17 DIAGNOSIS — E1140 Type 2 diabetes mellitus with diabetic neuropathy, unspecified: Secondary | ICD-10-CM | POA: Diagnosis not present

## 2019-10-19 ENCOUNTER — Other Ambulatory Visit: Payer: Medicare HMO | Admitting: Orthotics

## 2019-11-04 DIAGNOSIS — E1165 Type 2 diabetes mellitus with hyperglycemia: Secondary | ICD-10-CM | POA: Diagnosis not present

## 2019-11-04 DIAGNOSIS — I10 Essential (primary) hypertension: Secondary | ICD-10-CM | POA: Diagnosis not present

## 2019-11-07 DIAGNOSIS — E1140 Type 2 diabetes mellitus with diabetic neuropathy, unspecified: Secondary | ICD-10-CM | POA: Diagnosis not present

## 2019-11-07 DIAGNOSIS — Z1389 Encounter for screening for other disorder: Secondary | ICD-10-CM | POA: Diagnosis not present

## 2019-11-07 DIAGNOSIS — Z1331 Encounter for screening for depression: Secondary | ICD-10-CM | POA: Diagnosis not present

## 2019-11-07 DIAGNOSIS — E039 Hypothyroidism, unspecified: Secondary | ICD-10-CM | POA: Diagnosis not present

## 2019-11-07 DIAGNOSIS — E1122 Type 2 diabetes mellitus with diabetic chronic kidney disease: Secondary | ICD-10-CM | POA: Diagnosis not present

## 2019-11-07 DIAGNOSIS — J1282 Pneumonia due to coronavirus disease 2019: Secondary | ICD-10-CM | POA: Diagnosis not present

## 2019-11-07 DIAGNOSIS — Z6839 Body mass index (BMI) 39.0-39.9, adult: Secondary | ICD-10-CM | POA: Diagnosis not present

## 2019-11-07 DIAGNOSIS — I5033 Acute on chronic diastolic (congestive) heart failure: Secondary | ICD-10-CM | POA: Diagnosis not present

## 2019-11-17 ENCOUNTER — Telehealth: Payer: Self-pay | Admitting: Podiatry

## 2019-11-17 NOTE — Telephone Encounter (Signed)
Received voicemail yesterday from a [place] checking status of diabetic shoes.   Returned call and left message we did get shoes in end of last week and to call to schedule an appt for 6.1.2021 in [place] to pick them up.Marland KitchenMarland Kitchen

## 2019-11-19 DIAGNOSIS — N189 Chronic kidney disease, unspecified: Secondary | ICD-10-CM | POA: Diagnosis not present

## 2019-11-19 DIAGNOSIS — Z713 Dietary counseling and surveillance: Secondary | ICD-10-CM | POA: Diagnosis not present

## 2019-11-19 DIAGNOSIS — I10 Essential (primary) hypertension: Secondary | ICD-10-CM | POA: Diagnosis not present

## 2019-11-19 DIAGNOSIS — Z6838 Body mass index (BMI) 38.0-38.9, adult: Secondary | ICD-10-CM | POA: Diagnosis not present

## 2019-11-19 DIAGNOSIS — R600 Localized edema: Secondary | ICD-10-CM | POA: Diagnosis not present

## 2019-11-19 DIAGNOSIS — E039 Hypothyroidism, unspecified: Secondary | ICD-10-CM | POA: Diagnosis not present

## 2019-11-19 DIAGNOSIS — E785 Hyperlipidemia, unspecified: Secondary | ICD-10-CM | POA: Diagnosis not present

## 2019-11-19 DIAGNOSIS — E1169 Type 2 diabetes mellitus with other specified complication: Secondary | ICD-10-CM | POA: Diagnosis not present

## 2019-11-23 ENCOUNTER — Ambulatory Visit: Payer: Medicare HMO | Admitting: Orthotics

## 2019-11-23 ENCOUNTER — Other Ambulatory Visit: Payer: Self-pay

## 2019-12-02 DIAGNOSIS — E039 Hypothyroidism, unspecified: Secondary | ICD-10-CM | POA: Diagnosis not present

## 2019-12-02 DIAGNOSIS — I87309 Chronic venous hypertension (idiopathic) without complications of unspecified lower extremity: Secondary | ICD-10-CM | POA: Diagnosis not present

## 2019-12-02 DIAGNOSIS — M109 Gout, unspecified: Secondary | ICD-10-CM | POA: Diagnosis not present

## 2019-12-02 DIAGNOSIS — K219 Gastro-esophageal reflux disease without esophagitis: Secondary | ICD-10-CM | POA: Diagnosis not present

## 2019-12-02 DIAGNOSIS — I10 Essential (primary) hypertension: Secondary | ICD-10-CM | POA: Diagnosis not present

## 2019-12-02 DIAGNOSIS — R600 Localized edema: Secondary | ICD-10-CM | POA: Diagnosis not present

## 2019-12-02 DIAGNOSIS — Z794 Long term (current) use of insulin: Secondary | ICD-10-CM | POA: Diagnosis not present

## 2019-12-02 DIAGNOSIS — E119 Type 2 diabetes mellitus without complications: Secondary | ICD-10-CM | POA: Diagnosis not present

## 2019-12-02 DIAGNOSIS — N400 Enlarged prostate without lower urinary tract symptoms: Secondary | ICD-10-CM | POA: Diagnosis not present

## 2019-12-05 DIAGNOSIS — E1122 Type 2 diabetes mellitus with diabetic chronic kidney disease: Secondary | ICD-10-CM | POA: Diagnosis not present

## 2019-12-05 DIAGNOSIS — I5032 Chronic diastolic (congestive) heart failure: Secondary | ICD-10-CM | POA: Diagnosis not present

## 2019-12-05 DIAGNOSIS — I130 Hypertensive heart and chronic kidney disease with heart failure and stage 1 through stage 4 chronic kidney disease, or unspecified chronic kidney disease: Secondary | ICD-10-CM | POA: Diagnosis not present

## 2019-12-05 DIAGNOSIS — N185 Chronic kidney disease, stage 5: Secondary | ICD-10-CM | POA: Diagnosis not present

## 2019-12-13 ENCOUNTER — Ambulatory Visit (INDEPENDENT_AMBULATORY_CARE_PROVIDER_SITE_OTHER): Payer: Medicare HMO | Admitting: Podiatry

## 2019-12-13 ENCOUNTER — Other Ambulatory Visit: Payer: Self-pay

## 2019-12-13 DIAGNOSIS — R600 Localized edema: Secondary | ICD-10-CM | POA: Diagnosis not present

## 2019-12-13 DIAGNOSIS — B351 Tinea unguium: Secondary | ICD-10-CM | POA: Diagnosis not present

## 2019-12-13 DIAGNOSIS — I10 Essential (primary) hypertension: Secondary | ICD-10-CM

## 2019-12-13 DIAGNOSIS — E1142 Type 2 diabetes mellitus with diabetic polyneuropathy: Secondary | ICD-10-CM

## 2019-12-13 DIAGNOSIS — E1169 Type 2 diabetes mellitus with other specified complication: Secondary | ICD-10-CM | POA: Diagnosis not present

## 2019-12-13 DIAGNOSIS — N183 Chronic kidney disease, stage 3 unspecified: Secondary | ICD-10-CM

## 2019-12-13 DIAGNOSIS — E119 Type 2 diabetes mellitus without complications: Secondary | ICD-10-CM | POA: Diagnosis not present

## 2019-12-13 NOTE — Progress Notes (Signed)
°  Subjective:  Patient ID: Paul Picket., male    DOB: [birth year],  MRN: [id number]  Chief Complaint  Patient presents with   debride    DFC -FBS; 186 x 2 day sA1C: na PCP; [facility] x 4 wks   shoes    PUDS -shoes very tight, does not fit properly    86 y.o. male presents with the above complaint. History confirmed with patient.   Objective:  Physical Exam: warm, good capillary refill, nail exam onychomycosis of the toenails, no trophic changes or ulcerative lesions. DP pulses palpable, PT pulses palpable, protective sensation absent and vibratory sensation diminished hammertoes bilat   Assessment:   1. Onychomycosis of multiple toenails with type 2 diabetes mellitus and peripheral neuropathy ([place])   2. Localized edema      Plan:  Patient was evaluated and treated and all questions answered.  Onychomycosis, Diabetes, DPN and Coagulation Defect -Nails debrided as below  Procedure: Nail Debridement Rationale: Patient meets criteria for routine foot care due to DPN Type of Debridement: manual, sharp debridement. Instrumentation: Nail nipper, rotary burr. Number of Nails: 10  LE Edema -Patient with active wheeze. Concern for fluid overload. He has active wheeze but not SOB or weakness. Vitals with 97% on RA, pulse of 67, BP of 189/96. Feels ok. Recommended f/u with PCP this week and to go to ED should symptoms worsen.  No follow-ups on file.

## 2019-12-17 DIAGNOSIS — E039 Hypothyroidism, unspecified: Secondary | ICD-10-CM | POA: Diagnosis not present

## 2019-12-17 DIAGNOSIS — E1169 Type 2 diabetes mellitus with other specified complication: Secondary | ICD-10-CM | POA: Diagnosis not present

## 2019-12-17 DIAGNOSIS — N1832 Chronic kidney disease, stage 3b: Secondary | ICD-10-CM | POA: Diagnosis not present

## 2019-12-17 DIAGNOSIS — R600 Localized edema: Secondary | ICD-10-CM | POA: Diagnosis not present

## 2019-12-17 DIAGNOSIS — I10 Essential (primary) hypertension: Secondary | ICD-10-CM | POA: Diagnosis not present

## 2019-12-17 DIAGNOSIS — I5022 Chronic systolic (congestive) heart failure: Secondary | ICD-10-CM | POA: Diagnosis not present

## 2019-12-17 DIAGNOSIS — E785 Hyperlipidemia, unspecified: Secondary | ICD-10-CM | POA: Diagnosis not present

## 2019-12-19 DIAGNOSIS — E1169 Type 2 diabetes mellitus with other specified complication: Secondary | ICD-10-CM | POA: Diagnosis not present

## 2019-12-19 DIAGNOSIS — E039 Hypothyroidism, unspecified: Secondary | ICD-10-CM | POA: Diagnosis not present

## 2019-12-19 DIAGNOSIS — E785 Hyperlipidemia, unspecified: Secondary | ICD-10-CM | POA: Diagnosis not present

## 2019-12-19 DIAGNOSIS — N1832 Chronic kidney disease, stage 3b: Secondary | ICD-10-CM | POA: Diagnosis not present

## 2019-12-20 ENCOUNTER — Telehealth: Payer: Self-pay | Admitting: Cardiology

## 2019-12-20 NOTE — Telephone Encounter (Signed)
Patient's daughter would like any paperwork that needs to be filled out for her fathers appointment on 12/29/19 with Dr. Geraldo Pitter to be mailed to [address] [place], [place] [address]

## 2019-12-20 NOTE — Telephone Encounter (Signed)
This pt is a new pt and his daughter would like his new pt paperwork mailed to her.

## 2019-12-21 DIAGNOSIS — E1169 Type 2 diabetes mellitus with other specified complication: Secondary | ICD-10-CM | POA: Diagnosis not present

## 2019-12-21 DIAGNOSIS — Z6838 Body mass index (BMI) 38.0-38.9, adult: Secondary | ICD-10-CM | POA: Diagnosis not present

## 2019-12-21 DIAGNOSIS — E785 Hyperlipidemia, unspecified: Secondary | ICD-10-CM | POA: Diagnosis not present

## 2019-12-29 ENCOUNTER — Ambulatory Visit (INDEPENDENT_AMBULATORY_CARE_PROVIDER_SITE_OTHER): Payer: Medicare HMO | Admitting: Cardiology

## 2019-12-29 ENCOUNTER — Other Ambulatory Visit: Payer: Self-pay

## 2019-12-29 ENCOUNTER — Encounter: Payer: Self-pay | Admitting: Cardiology

## 2019-12-29 VITALS — BP 148/70 | HR 68 | Ht 74.0 in | Wt 308.0 lb

## 2019-12-29 DIAGNOSIS — I10 Essential (primary) hypertension: Secondary | ICD-10-CM

## 2019-12-29 DIAGNOSIS — G459 Transient cerebral ischemic attack, unspecified: Secondary | ICD-10-CM | POA: Diagnosis not present

## 2019-12-29 DIAGNOSIS — N183 Chronic kidney disease, stage 3 unspecified: Secondary | ICD-10-CM

## 2019-12-29 DIAGNOSIS — E1159 Type 2 diabetes mellitus with other circulatory complications: Secondary | ICD-10-CM | POA: Diagnosis not present

## 2019-12-29 DIAGNOSIS — E782 Mixed hyperlipidemia: Secondary | ICD-10-CM | POA: Diagnosis not present

## 2019-12-29 DIAGNOSIS — E875 Hyperkalemia: Secondary | ICD-10-CM | POA: Diagnosis not present

## 2019-12-29 DIAGNOSIS — N1831 Chronic kidney disease, stage 3a: Secondary | ICD-10-CM | POA: Diagnosis not present

## 2019-12-29 DIAGNOSIS — I2510 Atherosclerotic heart disease of native coronary artery without angina pectoris: Secondary | ICD-10-CM

## 2019-12-29 DIAGNOSIS — Z794 Long term (current) use of insulin: Secondary | ICD-10-CM

## 2019-12-29 DIAGNOSIS — E1121 Type 2 diabetes mellitus with diabetic nephropathy: Secondary | ICD-10-CM | POA: Diagnosis not present

## 2019-12-29 DIAGNOSIS — R69 Illness, unspecified: Secondary | ICD-10-CM | POA: Diagnosis not present

## 2019-12-29 DIAGNOSIS — E1122 Type 2 diabetes mellitus with diabetic chronic kidney disease: Secondary | ICD-10-CM

## 2019-12-29 MED ORDER — ASPIRIN EC 81 MG PO TBEC
81.0000 mg | DELAYED_RELEASE_TABLET | Freq: Every day | ORAL | 3 refills | Status: AC
Start: 2019-12-29 — End: ?

## 2019-12-29 MED ORDER — RANOLAZINE ER 500 MG PO TB12
500.0000 mg | ORAL_TABLET | Freq: Two times a day (BID) | ORAL | 6 refills | Status: AC
Start: 2019-12-29 — End: ?

## 2019-12-29 MED ORDER — METOLAZONE 2.5 MG PO TABS
2.5000 mg | ORAL_TABLET | Freq: Every day | ORAL | 3 refills | Status: DC
Start: 2019-12-29 — End: 2020-01-31

## 2019-12-29 NOTE — Patient Instructions (Addendum)
Medication Instructions:  Your physician has recommended you make the following change in your medication:   Stop Lasix (Furosemide) Start Ranexa 500 mg twice daily. Start Metolazone 5 mg daily for 3 days (2 tablets) then you will take 1 tablet daily. Decrease your aspirin to 81 mg daily.  *If you need a refill on your cardiac medications before your next appointment, please call your pharmacy*   Lab Work: Your physician recommends that you return for lab work in: 1 week. You can come Monday through Friday 8:30 am to 12:00 pm and 1:15 to 4:30. You do not need to make an appointment as the order has already been placed. You will have a basic metabolic panel.   If you have labs (blood work) drawn today and your tests are completely normal, you will receive your results only by: Marland Kitchen MyChart Message (if you have MyChart) OR . A paper copy in the mail If you have any lab test that is abnormal or we need to change your treatment, we will call you to review the results.   Testing/Procedures: None ordered   Follow-Up: At [facility], you and your health needs are our priority.  As part of our continuing mission to provide you with exceptional heart care, we have created designated Provider Care Teams.  These Care Teams include your primary Cardiologist (physician) and Advanced Practice Providers (APPs -  Physician Assistants and Nurse Practitioners) who all work together to provide you with the care you need, when you need it.  We recommend signing up for the patient portal called "MyChart".  Sign up information is provided on this After Visit Summary.  MyChart is used to connect with patients for Virtual Visits (Telemedicine).  Patients are able to view lab/test results, encounter notes, upcoming appointments, etc.  Non-urgent messages can be sent to your provider as well.   To learn more about what you can do with MyChart, go to [email or web address].    Your next appointment:   3  week(s)  The format for your next appointment:   In Person  Provider:   Jyl Heinz, MD   Other Instructions Ranolazine tablets, extended release What is this medicine? RANOLAZINE (ra NOE la zeen) is a heart medicine. It is used to treat chronic chest pain (angina). This medicine must be taken regularly. It will not relieve an acute episode of chest pain. This medicine may be used for other purposes; ask your health care provider or pharmacist if you have questions. COMMON BRAND NAME(S): Ranexa What should I tell my health care provider before I take this medicine? They need to know if you have any of these conditions:  heart disease  irregular heartbeat  kidney disease  liver disease  low levels of potassium or magnesium in the blood  an unusual or allergic reaction to ranolazine, other medicines, foods, dyes, or preservatives  pregnant or trying to get pregnant  breast-feeding How should I use this medicine? Take this medicine by mouth with a glass of water. Follow the directions on the prescription label. Do not cut, crush, or chew this medicine. Take with or without food. Do not take this medication with grapefruit juice. Take your doses at regular intervals. Do not take your medicine more often then directed. Talk to your pediatrician regarding the use of this medicine in children. Special care may be needed. Overdosage: If you think you have taken too much of this medicine contact a poison control center or emergency room at once. NOTE: This  medicine is only for you. Do not share this medicine with others. What if I miss a dose? If you miss a dose, take it as soon as you can. If it is almost time for your next dose, take only that dose. Do not take double or extra doses. What may interact with this medicine? Do not take this medicine with any of the following medications:  antivirals for HIV or AIDS  cerivastatin  certain antibiotics like chloramphenicol,  clarithromycin, dalfopristin; quinupristin, isoniazid, rifabutin, rifampin, rifapentine  certain medicines used for cancer like imatinib, nilotinib  certain medicines for fungal infections like fluconazole, itraconazole, ketoconazole, posaconazole, voriconazole  certain medicines for irregular heart beat like dronedarone  certain medicines for seizures like carbamazepine, fosphenytoin, oxcarbazepine, phenobarbital, phenytoin  cisapride  conivaptan  cyclosporine  grapefruit or grapefruit juice  lumacaftor; ivacaftor  nefazodone  pimozide  quinacrine  St John's wort  thioridazine This medicine may also interact with the following medications:  alfuzosin  certain medicines for depression, anxiety, or psychotic disturbances like bupropion, citalopram, fluoxetine, fluphenazine, paroxetine, perphenazine, risperidone, sertraline, trifluoperazine  certain medicines for cholesterol like atorvastatin, lovastatin, simvastatin  certain medicines for stomach problems like octreotide, palonosetron, prochlorperazine  eplerenone  ergot alkaloids like dihydroergotamine, ergonovine, ergotamine, methylergonovine  metformin  nicardipine  other medicines that prolong the QT interval (cause an abnormal heart rhythm) like dofetilide, ziprasidone  sirolimus  tacrolimus This list may not describe all possible interactions. Give your health care provider a list of all the medicines, herbs, non-prescription drugs, or dietary supplements you use. Also tell them if you smoke, drink alcohol, or use illegal drugs. Some items may interact with your medicine. What should I watch for while using this medicine? Visit your doctor for regular check ups. Tell your doctor or healthcare professional if your symptoms do not start to get better or if they get worse. This medicine will not relieve an acute attack of angina or chest pain. This medicine can change your heart rhythm. Your health care  provider may check your heart rhythm by ordering an electrocardiogram (ECG) while you are taking this medicine. You may get drowsy or dizzy. Do not drive, use machinery, or do anything that needs mental alertness until you know how this medicine affects you. Do not stand or sit up quickly, especially if you are an older patient. This reduces the risk of dizzy or fainting spells. Alcohol may interfere with the effect of this medicine. Avoid alcoholic drinks. If you are scheduled for any medical or dental procedure, tell your healthcare provider that you are taking this medicine. This medicine can interact with other medicines used during surgery. What side effects may I notice from receiving this medicine? Side effects that you should report to your doctor or health care professional as soon as possible:  allergic reactions like skin rash, itching or hives, swelling of the face, lips, or tongue  breathing problems  changes in vision  fast, irregular or pounding heartbeat  feeling faint or lightheaded, falls  low or high blood pressure  numbness or tingling feelings  ringing in the ears  tremor or shakiness  slow heartbeat (fewer than 50 beats per minute)  swelling of the legs or feet Side effects that usually do not require medical attention (report to your doctor or health care professional if they continue or are bothersome):  constipation  drowsy  dry mouth  headache  nausea or vomiting  stomach upset This list may not describe all possible side effects. Call your  doctor for medical advice about side effects. You may report side effects to FDA at 1-800-FDA-1088. Where should I keep my medicine? Keep out of the reach of children. Store at room temperature between 15 and 30 degrees C (59 and 86 degrees F). Throw away any unused medicine after the expiration date. NOTE: This sheet is a summary. It may not cover all possible information. If you have questions about this  medicine, talk to your doctor, pharmacist, or health care provider.  2020 Elsevier/Gold Standard (2018-06-15 09:18:49)  Metolazone Oral Tablets What is this medicine? METOLAZONE (me TOLE a zone) is a diuretic. It helps you make more urine and to lose salt and excess water from your body. It treats swelling from heart or kidney disease. It also treats high blood pressure. This medicine may be used for other purposes; ask your health care provider or pharmacist if you have questions. COMMON BRAND NAME(S): Mykrox, Zaroxolyn What should I tell my health care provider before I take this medicine? They need to know if you have any of these conditions:  diabetes  gout  immune system problems, like lupus  kidney disease  liver disease  pancreatitis  small amount of urine or difficulty passing urine  an unusual or allergic reaction to metolazone, sulfa drugs, other medicines, foods, dyes, or preservatives  pregnant or trying to get pregnant  breast-feeding How should I use this medicine? Take this drug by mouth. Take it as directed on the prescription label at the same time every day. Keep taking it unless your health care provider tells you to stop. Talk to your health care provider about the use of this drug in children. Special care may be needed. Overdosage: If you think you have taken too much of this medicine contact a poison control center or emergency room at once. NOTE: This medicine is only for you. Do not share this medicine with others. What if I miss a dose? If you miss a dose, take it as soon as you can. If it is almost time for your next dose, take only that dose. Do not take double or extra doses. What may interact with this medicine?  alcohol  antiinflammatory drugs for pain or swelling  barbiturates for sleep or seizure control  digoxin  dofetilide  lithium  medicines for blood sugar  medicines for high blood pressure  medicines that relax muscles for  surgery  methenamine  other diuretics  some medicines for pain  steroid hormones like cortisone, hydrocortisone, and prednisone  warfarin This list may not describe all possible interactions. Give your health care provider a list of all the medicines, herbs, non-prescription drugs, or dietary supplements you use. Also tell them if you smoke, drink alcohol, or use illegal drugs. Some items may interact with your medicine. What should I watch for while using this medicine? Visit your doctor or health care professional for regular checks on your progress. Check your blood pressure as directed. Ask your doctor or health care professional what your blood pressure should be and when you should contact him or her. You may need to be on a special diet while taking this medicine. Ask your doctor. Check with your doctor or health care professional if you get an attack of severe diarrhea, nausea and vomiting, or if you sweat a lot. The loss of too much body fluid can make it dangerous for you to take this medicine. You may get drowsy or dizzy. Do not drive, use machinery, or do anything that  needs mental alertness until you know how this medicine affects you. Do not stand or sit up quickly, especially if you are an older patient. This reduces the risk of dizzy or fainting spells. Alcohol may interfere with the effect of this medicine. Avoid alcoholic drinks. This medicine may affect your blood sugar level. If you have diabetes, check with your doctor or health care professional before changing the dose of your diabetic medicine. This medicine can make you more sensitive to the sun. Keep out of the sun. If you cannot avoid being in the sun, wear protective clothing and use sunscreen. Do not use sun lamps or tanning beds/booths. What side effects may I notice from receiving this medicine? Side effects that you should report to your doctor or health care professional as soon as possible:  allergic reactions  such as skin rash or itching, hives, swelling of the lips, mouth, tongue, or throat  fast or irregular heartbeat, chest pain  feeling faint  fever, chills  gout pain  hot red lump on leg  muscle pain, cramps  nausea, vomiting  numbness or tingling in hands, feet  pain or difficulty when passing urine  redness, blistering, peeling or loosening of the skin, including inside the mouth  unusual bleeding or bruising  unusually weak or tired  yellowing of the eyes, skin Side effects that usually do not require medical attention (report to your doctor or health care professional if they continue or are bothersome):  abdominal pain  blurred vision  constipation or diarrhea  dry mouth  headache This list may not describe all possible side effects. Call your doctor for medical advice about side effects. You may report side effects to FDA at 1-800-FDA-1088. Where should I keep my medicine? Keep out of the reach of children and pets. Store at room temperature between 20 and 25 degrees C (68 and 77 degrees F). Protect from light. Throw away any unused drug after the expiration date. NOTE: This sheet is a summary. It may not cover all possible information. If you have questions about this medicine, talk to your doctor, pharmacist, or health care provider.  2020 Elsevier/Gold Standard (2019-02-04 17:51:12)   Low-Sodium Eating Plan Sodium, which is an element that makes up salt, helps you maintain a healthy balance of fluids in your body. Too much sodium can increase your blood pressure and cause fluid and waste to be held in your body. Your health care provider or dietitian may recommend following this plan if you have high blood pressure (hypertension), kidney disease, liver disease, or heart failure. Eating less sodium can help lower your blood pressure, reduce swelling, and protect your heart, liver, and kidneys. What are tips for following this plan? General guidelines  Most  people on this plan should limit their sodium intake to 1,500-2,000 mg (milligrams) of sodium each day. Reading food labels   The Nutrition Facts label lists the amount of sodium in one serving of the food. If you eat more than one serving, you must multiply the listed amount of sodium by the number of servings.  Choose foods with less than 140 mg of sodium per serving.  Avoid foods with 300 mg of sodium or more per serving. Shopping  Look for lower-sodium products, often labeled as "low-sodium" or "no salt added."  Always check the sodium content even if foods are labeled as "unsalted" or "no salt added".  Buy fresh foods. ? Avoid canned foods and premade or frozen meals. ? Avoid canned, cured, or  processed meats  Buy breads that have less than 80 mg of sodium per slice. Cooking  Eat more home-cooked food and less restaurant, buffet, and fast food.  Avoid adding salt when cooking. Use salt-free seasonings or herbs instead of table salt or sea salt. Check with your health care provider or pharmacist before using salt substitutes.  Cook with plant-based oils, such as canola, sunflower, or olive oil. Meal planning  When eating at a restaurant, ask that your food be prepared with less salt or no salt, if possible.  Avoid foods that contain MSG (monosodium glutamate). MSG is sometimes added to [place] food, bouillon, and some canned foods. What foods are recommended? The items listed may not be a complete list. Talk with your dietitian about what dietary choices are best for you. Grains Low-sodium cereals, including oats, puffed wheat and rice, and shredded wheat. Low-sodium crackers. Unsalted rice. Unsalted pasta. Low-sodium bread. Whole-grain breads and whole-grain pasta. Vegetables Fresh or frozen vegetables. "No salt added" canned vegetables. "No salt added" tomato sauce and paste. Low-sodium or reduced-sodium tomato and vegetable juice. Fruits Fresh, frozen, or canned fruit.  Fruit juice. Meats and other protein foods Fresh or frozen (no salt added) meat, poultry, seafood, and fish. Low-sodium canned tuna and salmon. Unsalted nuts. Dried peas, beans, and lentils without added salt. Unsalted canned beans. Eggs. Unsalted nut butters. Dairy Milk. Soy milk. Cheese that is naturally low in sodium, such as ricotta cheese, fresh mozzarella, or Swiss cheese Low-sodium or reduced-sodium cheese. Cream cheese. Yogurt. Fats and oils Unsalted butter. Unsalted margarine with no trans fat. Vegetable oils such as canola or olive oils. Seasonings and other foods Fresh and dried herbs and spices. Salt-free seasonings. Low-sodium mustard and ketchup. Sodium-free salad dressing. Sodium-free light mayonnaise. Fresh or refrigerated horseradish. Lemon juice. Vinegar. Homemade, reduced-sodium, or low-sodium soups. Unsalted popcorn and pretzels. Low-salt or salt-free chips. What foods are not recommended? The items listed may not be a complete list. Talk with your dietitian about what dietary choices are best for you. Grains Instant hot cereals. Bread stuffing, pancake, and biscuit mixes. Croutons. Seasoned rice or pasta mixes. Noodle soup cups. Boxed or frozen macaroni and cheese. Regular salted crackers. Self-rising flour. Vegetables Sauerkraut, pickled vegetables, and relishes. Olives. [place] fries. Onion rings. Regular canned vegetables (not low-sodium or reduced-sodium). Regular canned tomato sauce and paste (not low-sodium or reduced-sodium). Regular tomato and vegetable juice (not low-sodium or reduced-sodium). Frozen vegetables in sauces. Meats and other protein foods Meat or fish that is salted, canned, smoked, spiced, or pickled. Bacon, ham, sausage, hotdogs, corned beef, chipped beef, packaged lunch meats, salt pork, jerky, pickled herring, anchovies, regular canned tuna, sardines, salted nuts. Dairy Processed cheese and cheese spreads. Cheese curds. Blue cheese. Feta cheese. String  cheese. Regular cottage cheese. Buttermilk. Canned milk. Fats and oils Salted butter. Regular margarine. Ghee. Bacon fat. Seasonings and other foods Onion salt, garlic salt, seasoned salt, table salt, and sea salt. Canned and packaged gravies. Worcestershire sauce. Tartar sauce. Barbecue sauce. Teriyaki sauce. Soy sauce, including reduced-sodium. Steak sauce. Fish sauce. Oyster sauce. Cocktail sauce. Horseradish that you find on the shelf. Regular ketchup and mustard. Meat flavorings and tenderizers. Bouillon cubes. Hot sauce and Tabasco sauce. Premade or packaged marinades. Premade or packaged taco seasonings. Relishes. Regular salad dressings. Salsa. Potato and tortilla chips. Corn chips and puffs. Salted popcorn and pretzels. Canned or dried soups. Pizza. Frozen entrees and pot pies. Summary  Eating less sodium can help lower your blood pressure, reduce swelling, and protect  your heart, liver, and kidneys.  Most people on this plan should limit their sodium intake to 1,500-2,000 mg (milligrams) of sodium each day.  Canned, boxed, and frozen foods are high in sodium. Restaurant foods, fast foods, and pizza are also very high in sodium. You also get sodium by adding salt to food.  Try to cook at home, eat more fresh fruits and vegetables, and eat less fast food, canned, processed, or prepared foods. This information is not intended to replace advice given to you by your health care provider. Make sure you discuss any questions you have with your health care provider. Document Revised: 06/05/2017 Document Reviewed: 06/16/2016 Elsevier Patient Education  2020 [employer].

## 2019-12-29 NOTE — Progress Notes (Signed)
Cardiology Office Note:    Date:  12/29/2019   ID:  Paul Wood., DOB 07-27-1931, MRN [id number]  PCP:  Nicoletta Dress, MD  Cardiologist:  Jenean Lindau, MD   Referring MD: Nicoletta Dress, MD    ASSESSMENT:    1. Atherosclerosis of native coronary artery of native heart without angina pectoris   2. DM type 2 causing vascular disease ([place])   3. Essential hypertension, benign   4. Mixed hyperlipidemia   5. TIA (transient ischemic attack)   6. Type 2 diabetes mellitus with stage 3a chronic kidney disease, with long-term current use of insulin (HCC)   7. Stage 3 chronic kidney disease, unspecified whether stage 3a or 3b CKD   8. Morbid obesity ([place])    PLAN:    In order of problems listed above:  1. Coronary artery disease: Secondary prevention stressed with the patient.  Importance of compliance with diet medication stressed and he vocalized understanding.  In view of bilateral pedal edema and shortness of breath I will do a echocardiogram to assess left ventricular systolic function.  I will also initiate him on ranolazine 500 mg twice daily.  I will change his medication from furosemide to metolazone 5 mg daily for 3 days and then half tablet daily.  These instructions were given to him in writing and he vocalized understanding.  Salt intake issues were discussed and he vocalized understanding.  He will be back in a week for a Chem-7. 2. Essential hypertension: Blood pressure is fine but I will switch his medication from furosemide to metolazone. 3. Significant and advanced renal insufficiency: For this reason I do not want to do any evaluation for coronary artery disease unless he is refractory to therapy.  It is because of this person needs an intervention with radiocontrast is going to put him at a significant risk for worsening renal function.  He and his daughter vocalized understanding this and they are absolutely with this mindset of medical management. 4. Mixed  dyslipidemia diabetes mellitus and obesity: I did extensive discussion about this with the patient.  Diet was emphasized weight reduction was stressed and he promises to comply.Patient will be seen in follow-up appointment in 3 weeks or earlier if the patient has any concerns.  I also cut down his aspirin to 81 mg daily.   Medication Adjustments/Labs and Tests Ordered: Current medicines are reviewed at length with the patient today.  Concerns regarding medicines are outlined above.  No orders of the defined types were placed in this encounter.  No orders of the defined types were placed in this encounter.    History of Present Illness:    Paul Wood. is a 86 y.o. male who is being seen today for the evaluation of coronary artery disease at the request of Nicoletta Dress, MD.  Patient is a pleasant 86 year old male.  He has moved to this part of [place] to be with his daughter.  He has multiple complex medical issues.  He underwent stenting to right coronary artery in 2012 according to the records.  History of essential hypertension dyslipidemia diabetes mellitus and obesity.  He mentions some shortness of breath on exertion.  No chest pain orthopnea or PND.  At the time of my evaluation, the patient is alert awake oriented and in no distress.  He has bilateral pedal edema and that is another issue.  He is here to get established.  Past Medical History:  Diagnosis Date  . AKI (  acute kidney injury) ([place]) 08/09/2019  . BPH (benign prostatic hyperplasia) 02/08/2015  . Chronic renal insufficiency 11/01/2010  . CKD (chronic kidney disease), stage III 02/08/2015  . Class 1 obesity due to excess calories with serious comorbidity and body mass index (BMI) of 33.0 to 33.9 in adult 09/29/2008   Qualifier: Diagnosis of  By: Jonna Munro MD, Roderic Scarce    . Coronary atherosclerosis of native coronary artery    4/12 - DES mid RCA, DES distal LAD, DES x2 mid LAD  . Depression   . DM type 2 causing  vascular disease ([place]) 03/30/2017  . Essential hypertension, benign   . GERD without esophagitis 02/08/2015  . Gout   . Hearing loss   . Hyperkalemia 08/10/2019  . Hypertrophy of prostate without urinary obstruction and other lower urinary tract symptoms (LUTS)   . Hypothyroidism   . Incarcerated umbilical hernia   . Mixed hyperlipidemia   . Proteinuria    Mild  . PUD (peptic ulcer disease)   . Shortness of breath 01/20/2014  . Stroke [facility])    2008; slight st memory deficirs since last stroke  . TIA (transient ischemic attack) 02/08/2015  . Type 2 diabetes mellitus ([place])   . Type 2 diabetes mellitus with stage 3 chronic kidney disease, with long-term current use of insulin ([place]) 09/29/2008   Qualifier: Diagnosis of  By: Jonna Munro MD, Roderic Scarce    . Ventral hernia     Past Surgical History:  Procedure Laterality Date  . CATARACT EXTRACTION W/PHACO Right 08/15/2014   Procedure: CATARACT EXTRACTION PHACO AND INTRAOCULAR LENS PLACEMENT (IOC);  Surgeon: Elta Guadeloupe T. Gershon Crane, MD;  Location: [facility];  Service: Ophthalmology;  Laterality: Right;  CDE:10.77  . HERNIA REPAIR  01/04/14   Dr. Aaron Edelman [place];umbilical  . MULTIPLE TOOTH EXTRACTIONS     HX DENTAL CARIES/ALMOST ALL REMOVED    Current Medications: Current Meds  Medication Sig  . allopurinol (ZYLOPRIM) 300 MG tablet Take 300 mg by mouth daily.  Marland Kitchen amLODipine (NORVASC) 5 MG tablet Take 1 tablet (5 mg total) by mouth daily.  Marland Kitchen ascorbic acid (VITAMIN C) 500 MG tablet Take 1 tablet (500 mg total) by mouth daily.  Marland Kitchen aspirin EC 325 MG tablet Take 325 mg by mouth daily.   Marland Kitchen atorvastatin (LIPITOR) 80 MG tablet TAKE 1 TABLET BY MOUTH AT BEDTIME ( PT NEEDS OFFICE VISIT) (Patient taking differently: Take 80 mg by mouth daily. )  . carvedilol (COREG) 12.5 MG tablet TAKE 1 TABLET BY MOUTH TWICE DAILY (Patient taking differently: Take 12.5 mg by mouth 2 (two) times daily. )  . clopidogrel (PLAVIX) 75 MG tablet TAKE 1 TABLET BY MOUTH EVERY DAY (PATIENT NEEDS  OFFICE VISIT) (Patient taking differently: Take 75 mg by mouth daily. )  . doxazosin (CARDURA) 2 MG tablet Take 1 tablet (2 mg total) by mouth daily.  . furosemide (LASIX) 20 MG tablet Take 1 tablet (20 mg total) by mouth daily.  Marland Kitchen KLOR-CON M20 20 MEQ tablet Take 20 mEq by mouth daily.  Marland Kitchen LEVEMIR FLEXTOUCH 100 UNIT/ML FlexPen 30 Units.   Marland Kitchen levothyroxine (SYNTHROID) 125 MCG tablet Take 125 mcg by mouth daily.  . pantoprazole (PROTONIX) 40 MG tablet Take 40 mg by mouth daily.   . tamsulosin (FLOMAX) 0.4 MG CAPS capsule Take 0.4 mg by mouth daily.  Marland Kitchen zinc sulfate 220 (50 Zn) MG capsule Take 1 capsule (220 mg total) by mouth daily.     Allergies:   Patient has no known allergies.   Social  History   Socioeconomic History  . Marital status: Married    Spouse name: Not on file  . Number of children: 4  . Years of education: 9th  . Highest education level: Not on file  Occupational History  . Occupation: Retired  Tobacco Use  . Smoking status: Never Smoker  . Smokeless tobacco: Never Used  Vaping Use  . Vaping Use: Never used  Substance and Sexual Activity  . Alcohol use: No    Alcohol/week: 0.0 standard drinks  . Drug use: No  . Sexual activity: Yes    Birth control/protection: None  Other Topics Concern  . 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   Social Determinants of Health   Financial Resource Strain:   . Difficulty of Paying Living Expenses:   Food Insecurity:   . Worried About Charity fundraiser in the Last Year:   . Arboriculturist in the Last Year:   Transportation Needs:   . Film/video editor (Medical):   Marland Kitchen Lack of Transportation (Non-Medical):   Physical Activity:   . Days of Exercise per Week:   . Minutes of Exercise per Session:   Stress:   . Feeling of Stress :   Social Connections:   . Frequency of Communication with Friends and Family:   . Frequency of Social  Gatherings with Friends and Family:   . Attends Religious Services:   . Active Member of Clubs or Organizations:   . Attends Archivist Meetings:   Marland Kitchen Marital Status:      Family History: The patient's family history includes Alzheimer's disease in his mother; Colon cancer in his sister; Gout in his father and son; Hyperlipidemia in his son; Hypertension in his son; Other in his brother and daughter.  ROS:   Please see the history of present illness.    All other systems reviewed and are negative.  EKGs/Labs/Other Studies Reviewed:    The following studies were reviewed today: EKG reveals sinus rhythm and nonspecific ST changes   Recent Labs: 08/09/2019: B Natriuretic Peptide 95.0 08/14/2019: ALT 43; BUN 49; Creatinine, Ser 1.47; Hemoglobin 10.6; Platelets 178; Potassium 5.0; Sodium 139  Recent Lipid Panel    Component Value Date/Time   CHOL 141 05/17/2015 0536   TRIG 48 05/17/2015 0536   HDL 58 05/17/2015 0536   CHOLHDL 2.4 05/17/2015 0536   VLDL 10 05/17/2015 0536   LDLCALC 73 05/17/2015 0536    Physical Exam:    VS:  BP (!) 148/70   Pulse 68   Ht 6\' 2"  (1.88 m)   Wt (!) 308 lb (139.7 kg)   SpO2 95%   BMI 39.54 kg/m     Wt Readings from Last 3 Encounters:  12/29/19 (!) 308 lb (139.7 kg)  08/09/19 262 lb 2 oz (118.9 kg)  07/13/17 261 lb (118.4 kg)     GEN: Patient is in no acute distress HEENT: Normal NECK: No JVD; No carotid bruits LYMPHATICS: No lymphadenopathy CARDIAC: S1 S2 regular, 2/6 systolic murmur at the apex. RESPIRATORY:  Clear to auscultation without rales, wheezing or rhonchi  ABDOMEN: Soft, non-tender, non-distended MUSCULOSKELETAL: Bilateral pedal 2-3+ edema; No deformity  SKIN: Warm and dry NEUROLOGIC:  Alert and oriented x 3 PSYCHIATRIC:  Normal affect    Signed, Jenean Lindau, MD  12/29/2019 9:27 AM    [place]

## 2019-12-30 ENCOUNTER — Telehealth: Payer: Self-pay | Admitting: Cardiology

## 2019-12-30 NOTE — Telephone Encounter (Signed)
Pt c/o medication issue:  1. Name of Medication:   ranolazine (RANEXA) 500 MG 12 hr tablet  atorvastatin (LIPITOR) 80 MG tablet  2. How are you currently taking this medication (dosage and times per day)? ranexa takes 1 tablet twice a day, lipitor 1 tablet a day at night  3. Are you having a reaction (difficulty breathing--STAT)? no  4. What is your medication issue? Patient's daughter Otilio Saber states in the patient's medication instructions it states his new medication ranexa could interact with his lipitor. She would like to know if it's still okay for him to take it and if they need to watch him.

## 2019-12-30 NOTE — Telephone Encounter (Signed)
It is best that the patient call the pharmacist and be advised about the interaction.  If there are any further questions they can contact me.

## 2019-12-30 NOTE — Telephone Encounter (Signed)
Called and let patient daughter know per dpr to check with pharmacists about interactions. She verbally understood and will call if she has any further questions.

## 2020-01-04 DIAGNOSIS — R262 Difficulty in walking, not elsewhere classified: Secondary | ICD-10-CM | POA: Diagnosis not present

## 2020-01-04 DIAGNOSIS — E1122 Type 2 diabetes mellitus with diabetic chronic kidney disease: Secondary | ICD-10-CM | POA: Diagnosis not present

## 2020-01-04 DIAGNOSIS — I5022 Chronic systolic (congestive) heart failure: Secondary | ICD-10-CM | POA: Diagnosis not present

## 2020-01-04 DIAGNOSIS — I110 Hypertensive heart disease with heart failure: Secondary | ICD-10-CM | POA: Diagnosis not present

## 2020-01-04 DIAGNOSIS — N185 Chronic kidney disease, stage 5: Secondary | ICD-10-CM | POA: Diagnosis not present

## 2020-01-04 DIAGNOSIS — I5032 Chronic diastolic (congestive) heart failure: Secondary | ICD-10-CM | POA: Diagnosis not present

## 2020-01-06 DIAGNOSIS — I252 Old myocardial infarction: Secondary | ICD-10-CM | POA: Diagnosis not present

## 2020-01-06 DIAGNOSIS — I441 Atrioventricular block, second degree: Secondary | ICD-10-CM | POA: Diagnosis not present

## 2020-01-06 DIAGNOSIS — N183 Chronic kidney disease, stage 3 unspecified: Secondary | ICD-10-CM | POA: Diagnosis not present

## 2020-01-06 DIAGNOSIS — Z794 Long term (current) use of insulin: Secondary | ICD-10-CM | POA: Diagnosis not present

## 2020-01-06 DIAGNOSIS — I10 Essential (primary) hypertension: Secondary | ICD-10-CM | POA: Diagnosis not present

## 2020-01-06 DIAGNOSIS — E1121 Type 2 diabetes mellitus with diabetic nephropathy: Secondary | ICD-10-CM | POA: Diagnosis not present

## 2020-01-06 DIAGNOSIS — R0602 Shortness of breath: Secondary | ICD-10-CM | POA: Diagnosis not present

## 2020-01-06 DIAGNOSIS — N1831 Chronic kidney disease, stage 3a: Secondary | ICD-10-CM | POA: Diagnosis not present

## 2020-01-06 DIAGNOSIS — E782 Mixed hyperlipidemia: Secondary | ICD-10-CM | POA: Diagnosis not present

## 2020-01-06 DIAGNOSIS — I2510 Atherosclerotic heart disease of native coronary artery without angina pectoris: Secondary | ICD-10-CM | POA: Diagnosis not present

## 2020-01-06 NOTE — Addendum Note (Signed)
Addended by: Truddie Hidden on: 01/06/2020 10:19 AM   Modules accepted: Orders

## 2020-01-08 LAB — BASIC METABOLIC PANEL
BUN/Creatinine Ratio: 18 (ref 10–24)
BUN: 36 mg/dL — ABNORMAL HIGH (ref 8–27)
CO2: 19 mmol/L — ABNORMAL LOW (ref 20–29)
Calcium: 8.5 mg/dL — ABNORMAL LOW (ref 8.6–10.2)
Chloride: 105 mmol/L (ref 96–106)
Creatinine, Ser: 1.96 mg/dL — ABNORMAL HIGH (ref 0.76–1.27)
GFR calc Af Amer: 35 mL/min/{1.73_m2} — ABNORMAL LOW (ref >59)
GFR calc non Af Amer: 30 mL/min/{1.73_m2} — ABNORMAL LOW (ref >59)
Glucose: 297 mg/dL — ABNORMAL HIGH (ref 65–99)
Potassium: 6 mmol/L — ABNORMAL HIGH (ref 3.5–5.2)
Sodium: 137 mmol/L (ref 134–144)

## 2020-01-08 LAB — PRO B NATRIURETIC PEPTIDE: NT-Pro BNP: 1095 pg/mL — ABNORMAL HIGH (ref 0–486)

## 2020-01-10 ENCOUNTER — Other Ambulatory Visit: Payer: Self-pay

## 2020-01-10 ENCOUNTER — Other Ambulatory Visit: Payer: Medicare HMO | Admitting: Orthotics

## 2020-01-10 DIAGNOSIS — H26491 Other secondary cataract, right eye: Secondary | ICD-10-CM | POA: Diagnosis not present

## 2020-01-10 DIAGNOSIS — M2042 Other hammer toe(s) (acquired), left foot: Secondary | ICD-10-CM | POA: Diagnosis not present

## 2020-01-10 DIAGNOSIS — R600 Localized edema: Secondary | ICD-10-CM | POA: Diagnosis not present

## 2020-01-10 DIAGNOSIS — E1142 Type 2 diabetes mellitus with diabetic polyneuropathy: Secondary | ICD-10-CM | POA: Diagnosis not present

## 2020-01-10 DIAGNOSIS — M2041 Other hammer toe(s) (acquired), right foot: Secondary | ICD-10-CM | POA: Diagnosis not present

## 2020-01-10 MED ORDER — SODIUM POLYSTYRENE SULFONATE PO POWD: Freq: Once | ORAL | 0 refills | Status: AC

## 2020-01-10 NOTE — Addendum Note (Signed)
Addended by: Truddie Hidden on: 01/10/2020 04:54 PM   Modules accepted: Orders

## 2020-01-11 ENCOUNTER — Telehealth: Payer: Self-pay | Admitting: Cardiology

## 2020-01-11 ENCOUNTER — Ambulatory Visit (INDEPENDENT_AMBULATORY_CARE_PROVIDER_SITE_OTHER): Payer: Medicare HMO

## 2020-01-11 DIAGNOSIS — I2510 Atherosclerotic heart disease of native coronary artery without angina pectoris: Secondary | ICD-10-CM

## 2020-01-11 DIAGNOSIS — I719 Aortic aneurysm of unspecified site, without rupture: Secondary | ICD-10-CM

## 2020-01-11 MED ORDER — LOKELMA 5 G PO PACK: 5.0000 g | PACK | Freq: Every day | ORAL | 0 refills | Status: DC

## 2020-01-11 NOTE — Progress Notes (Signed)
Complete echocardiogram performed.  Jimmy Prakash Kimberling RDCS, RVT  

## 2020-01-11 NOTE — Addendum Note (Signed)
Addended by: Truddie Hidden on: 01/11/2020 05:00 PM   Modules accepted: Orders

## 2020-01-11 NOTE — Telephone Encounter (Signed)
Patient's daughter, Otilio Saber, called to advise that the [facility], where the "Kayexalate 30 g 1 dose" was sent to, did not have the medicine yesterday and was told they had to order it, she stated she had checked again today and they still do not have the medicine. She is requesting guidance on what to do because patient was supposed to be taking this medicine to lower his potassium and has a recheck on potassium set for Fri 7/9, but has been unsuccessful to getting it. Referenced back to [place] and Dr. Julien Nordmann note regarding this, Reuben Likes was available to speak with daughter, transferred call to [place].

## 2020-01-13 ENCOUNTER — Telehealth: Payer: Self-pay

## 2020-01-13 DIAGNOSIS — E875 Hyperkalemia: Secondary | ICD-10-CM | POA: Diagnosis not present

## 2020-01-13 NOTE — Telephone Encounter (Signed)
Multiple attempts to reach the pt to have him go to the outpatient lab at [facility] for stat repeat potassium as he has been unable to get his kayexlate or [place].

## 2020-01-13 NOTE — Telephone Encounter (Signed)
Spoke with Otilio Saber and she will have the pt go to [facility] for stat BMET.today.

## 2020-01-18 DIAGNOSIS — H524 Presbyopia: Secondary | ICD-10-CM | POA: Diagnosis not present

## 2020-01-25 DIAGNOSIS — R69 Illness, unspecified: Secondary | ICD-10-CM | POA: Diagnosis not present

## 2020-01-31 ENCOUNTER — Encounter: Payer: Self-pay | Admitting: Cardiology

## 2020-01-31 ENCOUNTER — Ambulatory Visit: Payer: Medicare HMO | Admitting: Cardiology

## 2020-01-31 ENCOUNTER — Other Ambulatory Visit: Payer: Self-pay

## 2020-01-31 VITALS — BP 146/62 | HR 65 | Ht 74.0 in | Wt 305.0 lb

## 2020-01-31 DIAGNOSIS — E1159 Type 2 diabetes mellitus with other circulatory complications: Secondary | ICD-10-CM

## 2020-01-31 DIAGNOSIS — I10 Essential (primary) hypertension: Secondary | ICD-10-CM

## 2020-01-31 DIAGNOSIS — E782 Mixed hyperlipidemia: Secondary | ICD-10-CM | POA: Diagnosis not present

## 2020-01-31 DIAGNOSIS — I2510 Atherosclerotic heart disease of native coronary artery without angina pectoris: Secondary | ICD-10-CM

## 2020-01-31 DIAGNOSIS — R600 Localized edema: Secondary | ICD-10-CM

## 2020-01-31 NOTE — Progress Notes (Signed)
Cardiology Office Note:    Date:  01/31/2020   ID:  Paul Wood., DOB [birth year], MRN [id number]  PCP:  Nicoletta Dress, MD  Cardiologist:  Jenean Lindau, MD   Referring MD: Nicoletta Dress, MD    ASSESSMENT:    1. Accelerated hypertension   2. Atherosclerosis of native coronary artery of native heart without angina pectoris   3. DM type 2 causing vascular disease ([place])   4. Essential hypertension, benign   5. Mixed hyperlipidemia   6. Morbid obesity ([place])   7. Pedal edema    PLAN:    In order of problems listed above:  1. Coronary artery disease: Secondary prevention stressed with the patient.  Importance of compliance with diet medication stressed and he vocalized understanding.  Patient is tolerating Ranexa well.  He will continue this medication. 2. Essential hypertension: Blood pressure is stable and he will continue current medications. 3. Chronic renal insufficiency and hyperkalemia: This will be managed by his primary care physician and nephrologist.  He is having fluid retention issues also because of renal insufficiency.  Echocardiogram was unremarkable and discussed with the patient. 4. In view of bilateral pedal edema left greater than right we will do a DVT study. 5. Weight reduction was stressed and this of obesity explained and he vocalized understanding. 6. Patient will be seen in follow-up appointment in 6 months or earlier if the patient has any concerns    Medication Adjustments/Labs and Tests Ordered: Current medicines are reviewed at length with the patient today.  Concerns regarding medicines are outlined above.  No orders of the defined types were placed in this encounter.  No orders of the defined types were placed in this encounter.    No chief complaint on file.    History of Present Illness:    Paul Wood. is a 86 y.o. male.  Patient has past medical history of coronary artery disease essential hypertension dyslipidemia,  morbid obesity and advanced renal insufficiency.  He has had history of hyperkalemia.  He denies any problems except bilateral pedal edema left greater than right.  This has been happening for the past several months.  No chest pain orthopnea or PND.  His son-in-law accompanies him for this visit today.  At the time of my evaluation, the patient is alert awake oriented and in no distress.  Past Medical History:  Diagnosis Date  . AKI (acute kidney injury) ([place]) 08/09/2019  . BPH (benign prostatic hyperplasia) 02/08/2015  . Chronic renal insufficiency 11/01/2010  . CKD (chronic kidney disease), stage III 02/08/2015  . Class 1 obesity due to excess calories with serious comorbidity and body mass index (BMI) of 33.0 to 33.9 in adult 09/29/2008   Qualifier: Diagnosis of  By: Jonna Munro MD, Roderic Scarce    . Coronary atherosclerosis of native coronary artery    4/12 - DES mid RCA, DES distal LAD, DES x2 mid LAD  . Depression   . DM type 2 causing vascular disease ([place]) 03/30/2017  . Essential hypertension, benign   . GERD without esophagitis 02/08/2015  . Gout   . Hearing loss   . Hyperkalemia 08/10/2019  . Hypertrophy of prostate without urinary obstruction and other lower urinary tract symptoms (LUTS)   . Hypothyroidism   . Incarcerated umbilical hernia   . Mixed hyperlipidemia   . Proteinuria    Mild  . PUD (peptic ulcer disease)   . Shortness of breath 01/20/2014  . Stroke ([place])    2008; slight  st memory deficirs since last stroke  . TIA (transient ischemic attack) 02/08/2015  . Type 2 diabetes mellitus ([place])   . Type 2 diabetes mellitus with stage 3 chronic kidney disease, with long-term current use of insulin ([place]) 09/29/2008   Qualifier: Diagnosis of  By: Jonna Munro MD, Roderic Scarce    . Ventral hernia     Past Surgical History:  Procedure Laterality Date  . CATARACT EXTRACTION W/PHACO Right 08/15/2014   Procedure: CATARACT EXTRACTION PHACO AND INTRAOCULAR LENS PLACEMENT (IOC);  Surgeon: Elta Guadeloupe T. Gershon Crane,  MD;  Location: [facility];  Service: Ophthalmology;  Laterality: Right;  CDE:10.77  . HERNIA REPAIR  01/04/14   Dr. Aaron Edelman [place];umbilical  . MULTIPLE TOOTH EXTRACTIONS     HX DENTAL CARIES/ALMOST ALL REMOVED    Current Medications: Current Meds  Medication Sig  . allopurinol (ZYLOPRIM) 300 MG tablet Take 300 mg by mouth daily.  Marland Kitchen amLODipine (NORVASC) 5 MG tablet Take 1 tablet (5 mg total) by mouth daily.  Marland Kitchen ascorbic acid (VITAMIN C) 500 MG tablet Take 1 tablet (500 mg total) by mouth daily.  Marland Kitchen aspirin EC 81 MG tablet Take 1 tablet (81 mg total) by mouth daily. Swallow whole.  Marland Kitchen atorvastatin (LIPITOR) 80 MG tablet TAKE 1 TABLET BY MOUTH AT BEDTIME ( PT NEEDS OFFICE VISIT) (Patient taking differently: Take 80 mg by mouth daily. )  . carvedilol (COREG) 12.5 MG tablet TAKE 1 TABLET BY MOUTH TWICE DAILY (Patient taking differently: Take 12.5 mg by mouth 2 (two) times daily. )  . clopidogrel (PLAVIX) 75 MG tablet TAKE 1 TABLET BY MOUTH EVERY DAY (PATIENT NEEDS OFFICE VISIT) (Patient taking differently: Take 75 mg by mouth daily. )  . doxazosin (CARDURA) 2 MG tablet Take 1 tablet (2 mg total) by mouth daily.  . furosemide (LASIX) 40 MG tablet Take 40 mg by mouth 2 (two) times daily.  Marland Kitchen LEVEMIR FLEXTOUCH 100 UNIT/ML FlexPen 30 Units.   Marland Kitchen levothyroxine (SYNTHROID) 125 MCG tablet Take 125 mcg by mouth daily.  . pantoprazole (PROTONIX) 40 MG tablet Take 40 mg by mouth daily.   . ranolazine (RANEXA) 500 MG 12 hr tablet Take 1 tablet (500 mg total) by mouth 2 (two) times daily.  . tamsulosin (FLOMAX) 0.4 MG CAPS capsule Take 0.4 mg by mouth daily.  Marland Kitchen zinc sulfate 220 (50 Zn) MG capsule Take 1 capsule (220 mg total) by mouth daily.  . [DISCONTINUED] KLOR-CON M20 20 MEQ tablet Take 20 mEq by mouth daily.  . [DISCONTINUED] metolazone (ZAROXOLYN) 2.5 MG tablet Take 1 tablet (2.5 mg total) by mouth daily.     Allergies:   Patient has no known allergies.   Social History   Socioeconomic History  .  Marital status: Married    Spouse name: Not on file  . Number of children: 4  . Years of education: 9th  . Highest education level: Not on file  Occupational History  . Occupation: Retired  Tobacco Use  . Smoking status: Never Smoker  . Smokeless tobacco: Never Used  Vaping Use  . Vaping Use: Never used  Substance and Sexual Activity  . Alcohol use: No    Alcohol/week: 0.0 standard drinks  . Drug use: No  . Sexual activity: Yes    Birth control/protection: None  Other Topics Concern  . 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   Social Determinants of Health   Financial Resource Strain:   .  Difficulty of Paying Living Expenses:   Food Insecurity:   . Worried About Charity fundraiser in the Last Year:   . Arboriculturist in the Last Year:   Transportation Needs:   . Film/video editor (Medical):   Marland Kitchen Lack of Transportation (Non-Medical):   Physical Activity:   . Days of Exercise per Week:   . Minutes of Exercise per Session:   Stress:   . Feeling of Stress :   Social Connections:   . Frequency of Communication with Friends and Family:   . Frequency of Social Gatherings with Friends and Family:   . Attends Religious Services:   . Active Member of Clubs or Organizations:   . Attends Archivist Meetings:   Marland Kitchen Marital Status:      Family History: The patient's family history includes Alzheimer's disease in his mother; Colon cancer in his sister; Gout in his father and son; Hyperlipidemia in his son; Hypertension in his son; Other in his brother and daughter.  ROS:   Please see the history of present illness.    All other systems reviewed and are negative.  EKGs/Labs/Other Studies Reviewed:    The following studies were reviewed today: I discussed my findings with the patient at extensive length and EKG reveals sinus rhythm and nonspecific ST-T changes.   Recent  Labs: 08/09/2019: B Natriuretic Peptide 95.0 08/14/2019: ALT 43; Hemoglobin 10.6; Platelets 178 01/06/2020: BUN 36; Creatinine, Ser 1.96; NT-Pro BNP 1,095; Potassium 6.0; Sodium 137  Recent Lipid Panel    Component Value Date/Time   CHOL 141 05/17/2015 0536   TRIG 48 05/17/2015 0536   HDL 58 05/17/2015 0536   CHOLHDL 2.4 05/17/2015 0536   VLDL 10 05/17/2015 0536   LDLCALC 73 05/17/2015 0536    Physical Exam:    VS:  BP (!) 146/62 (BP Location: Left Arm, Patient Position: Sitting)   Pulse 65   Ht 6\' 2"  (1.88 m)   Wt (!) 305 lb (138.3 kg)   SpO2 94%   BMI 39.16 kg/m     Wt Readings from Last 3 Encounters:  01/31/20 (!) 305 lb (138.3 kg)  12/29/19 (!) 308 lb (139.7 kg)  08/09/19 262 lb 2 oz (118.9 kg)     GEN: Patient is in no acute distress HEENT: Normal NECK: No JVD; No carotid bruits LYMPHATICS: No lymphadenopathy CARDIAC: Hear sounds regular, 2/6 systolic murmur at the apex. RESPIRATORY:  Clear to auscultation without rales, wheezing or rhonchi  ABDOMEN: Soft, non-tender, non-distended MUSCULOSKELETAL: Bilateral pedal edema, left greater than right.; No deformity  SKIN: Warm and dry NEUROLOGIC:  Alert and oriented x 3 PSYCHIATRIC:  Normal affect   Signed, Jenean Lindau, MD  01/31/2020 9:20 AM    [place]

## 2020-01-31 NOTE — Patient Instructions (Signed)
Medication Instructions:  Your physician recommends that you continue on your current medications as directed. Please refer to the Current Medication list given to you today.  *If you need a refill on your cardiac medications before your next appointment, please call your pharmacy*   Lab Work: None ordered   If you have labs (blood work) drawn today and your tests are completely normal, you will receive your results only by:  [place] (if you have MyChart) OR  A paper copy in the mail If you have any lab test that is abnormal or we need to change your treatment, we will call you to review the results.   Testing/Procedures: A order has been placed for you to have a ultrasound at [facility] will contact you with a appointment    Follow-Up: At [facility], you and your health needs are our priority.  As part of our continuing mission to provide you with exceptional heart care, we have created designated Provider Care Teams.  These Care Teams include your primary Cardiologist (physician) and Advanced Practice Providers (APPs -  Physician Assistants and Nurse Practitioners) who all work together to provide you with the care you need, when you need it.  We recommend signing up for the patient portal called "MyChart".  Sign up information is provided on this After Visit Summary.  MyChart is used to connect with patients for Virtual Visits (Telemedicine).  Patients are able to view lab/test results, encounter notes, upcoming appointments, etc.  Non-urgent messages can be sent to your provider as well.   To learn more about what you can do with MyChart, go to [email or web address].    Your next appointment:   6 month(s)  The format for your next appointment:   In Person  Provider:   Jyl Heinz, MD   Other Instructions None

## 2020-02-06 ENCOUNTER — Encounter: Payer: Self-pay | Admitting: Cardiology

## 2020-02-06 DIAGNOSIS — I5032 Chronic diastolic (congestive) heart failure: Secondary | ICD-10-CM | POA: Diagnosis not present

## 2020-02-06 DIAGNOSIS — E039 Hypothyroidism, unspecified: Secondary | ICD-10-CM | POA: Diagnosis not present

## 2020-02-06 DIAGNOSIS — N1832 Chronic kidney disease, stage 3b: Secondary | ICD-10-CM | POA: Diagnosis not present

## 2020-02-06 DIAGNOSIS — I10 Essential (primary) hypertension: Secondary | ICD-10-CM | POA: Diagnosis not present

## 2020-02-06 DIAGNOSIS — R600 Localized edema: Secondary | ICD-10-CM | POA: Diagnosis not present

## 2020-02-06 DIAGNOSIS — I2510 Atherosclerotic heart disease of native coronary artery without angina pectoris: Secondary | ICD-10-CM | POA: Diagnosis not present

## 2020-02-06 DIAGNOSIS — Z713 Dietary counseling and surveillance: Secondary | ICD-10-CM | POA: Diagnosis not present

## 2020-02-07 ENCOUNTER — Telehealth: Payer: Self-pay

## 2020-02-07 NOTE — Telephone Encounter (Signed)
Pt's daughter aware that patients CT scan will be at [facility] on 02/09/20. Arrive in the outpatient center at 3:30 for a 4:00 scan. Verbalized understanding and had no additional questions.

## 2020-02-09 ENCOUNTER — Encounter: Payer: Self-pay | Admitting: Cardiology

## 2020-02-09 DIAGNOSIS — I700 Atherosclerosis of aorta: Secondary | ICD-10-CM | POA: Diagnosis not present

## 2020-02-09 DIAGNOSIS — I2510 Atherosclerotic heart disease of native coronary artery without angina pectoris: Secondary | ICD-10-CM | POA: Diagnosis not present

## 2020-02-09 DIAGNOSIS — I7789 Other specified disorders of arteries and arterioles: Secondary | ICD-10-CM | POA: Diagnosis not present

## 2020-02-09 DIAGNOSIS — I712 Thoracic aortic aneurysm, without rupture: Secondary | ICD-10-CM | POA: Diagnosis not present

## 2020-02-09 DIAGNOSIS — I289 Disease of pulmonary vessels, unspecified: Secondary | ICD-10-CM | POA: Diagnosis not present

## 2020-02-20 DIAGNOSIS — R809 Proteinuria, unspecified: Secondary | ICD-10-CM | POA: Diagnosis not present

## 2020-02-20 DIAGNOSIS — N1832 Chronic kidney disease, stage 3b: Secondary | ICD-10-CM | POA: Diagnosis not present

## 2020-02-20 DIAGNOSIS — E1122 Type 2 diabetes mellitus with diabetic chronic kidney disease: Secondary | ICD-10-CM | POA: Diagnosis not present

## 2020-02-20 DIAGNOSIS — I5033 Acute on chronic diastolic (congestive) heart failure: Secondary | ICD-10-CM | POA: Diagnosis not present

## 2020-02-20 DIAGNOSIS — E875 Hyperkalemia: Secondary | ICD-10-CM | POA: Diagnosis not present

## 2020-02-20 DIAGNOSIS — N400 Enlarged prostate without lower urinary tract symptoms: Secondary | ICD-10-CM | POA: Diagnosis not present

## 2020-02-20 DIAGNOSIS — R0600 Dyspnea, unspecified: Secondary | ICD-10-CM | POA: Diagnosis not present

## 2020-02-20 DIAGNOSIS — I129 Hypertensive chronic kidney disease with stage 1 through stage 4 chronic kidney disease, or unspecified chronic kidney disease: Secondary | ICD-10-CM | POA: Diagnosis not present

## 2020-02-20 DIAGNOSIS — D509 Iron deficiency anemia, unspecified: Secondary | ICD-10-CM | POA: Diagnosis not present

## 2020-02-26 DIAGNOSIS — R69 Illness, unspecified: Secondary | ICD-10-CM | POA: Diagnosis not present

## 2020-03-05 DIAGNOSIS — N1832 Chronic kidney disease, stage 3b: Secondary | ICD-10-CM | POA: Diagnosis not present

## 2020-03-05 DIAGNOSIS — I5033 Acute on chronic diastolic (congestive) heart failure: Secondary | ICD-10-CM | POA: Diagnosis not present

## 2020-03-05 DIAGNOSIS — E039 Hypothyroidism, unspecified: Secondary | ICD-10-CM | POA: Diagnosis not present

## 2020-03-05 DIAGNOSIS — E1122 Type 2 diabetes mellitus with diabetic chronic kidney disease: Secondary | ICD-10-CM | POA: Diagnosis not present

## 2020-03-05 DIAGNOSIS — D509 Iron deficiency anemia, unspecified: Secondary | ICD-10-CM | POA: Diagnosis not present

## 2020-03-05 DIAGNOSIS — I129 Hypertensive chronic kidney disease with stage 1 through stage 4 chronic kidney disease, or unspecified chronic kidney disease: Secondary | ICD-10-CM | POA: Diagnosis not present

## 2020-03-05 DIAGNOSIS — R809 Proteinuria, unspecified: Secondary | ICD-10-CM | POA: Diagnosis not present

## 2020-03-05 DIAGNOSIS — N400 Enlarged prostate without lower urinary tract symptoms: Secondary | ICD-10-CM | POA: Diagnosis not present

## 2020-03-05 DIAGNOSIS — E875 Hyperkalemia: Secondary | ICD-10-CM | POA: Diagnosis not present

## 2020-03-15 ENCOUNTER — Ambulatory Visit: Payer: Medicare HMO | Admitting: Podiatry

## 2020-03-19 ENCOUNTER — Ambulatory Visit: Payer: Medicare HMO | Admitting: Podiatry

## 2020-03-19 DIAGNOSIS — N1832 Chronic kidney disease, stage 3b: Secondary | ICD-10-CM | POA: Diagnosis not present

## 2020-03-19 DIAGNOSIS — E1140 Type 2 diabetes mellitus with diabetic neuropathy, unspecified: Secondary | ICD-10-CM | POA: Diagnosis not present

## 2020-03-19 DIAGNOSIS — E785 Hyperlipidemia, unspecified: Secondary | ICD-10-CM | POA: Diagnosis not present

## 2020-03-19 DIAGNOSIS — S90212A Contusion of left great toe with damage to nail, initial encounter: Secondary | ICD-10-CM | POA: Diagnosis not present

## 2020-03-19 DIAGNOSIS — I10 Essential (primary) hypertension: Secondary | ICD-10-CM | POA: Diagnosis not present

## 2020-03-19 DIAGNOSIS — I712 Thoracic aortic aneurysm, without rupture: Secondary | ICD-10-CM | POA: Diagnosis not present

## 2020-03-19 DIAGNOSIS — I5032 Chronic diastolic (congestive) heart failure: Secondary | ICD-10-CM | POA: Diagnosis not present

## 2020-03-19 DIAGNOSIS — E039 Hypothyroidism, unspecified: Secondary | ICD-10-CM | POA: Diagnosis not present

## 2020-03-19 DIAGNOSIS — E1165 Type 2 diabetes mellitus with hyperglycemia: Secondary | ICD-10-CM | POA: Diagnosis not present

## 2020-03-22 ENCOUNTER — Encounter: Payer: Self-pay | Admitting: Podiatry

## 2020-03-22 ENCOUNTER — Other Ambulatory Visit: Payer: Self-pay

## 2020-03-22 ENCOUNTER — Ambulatory Visit: Payer: Medicare HMO | Admitting: Podiatry

## 2020-03-22 DIAGNOSIS — E1142 Type 2 diabetes mellitus with diabetic polyneuropathy: Secondary | ICD-10-CM

## 2020-03-22 DIAGNOSIS — E1169 Type 2 diabetes mellitus with other specified complication: Secondary | ICD-10-CM

## 2020-03-22 DIAGNOSIS — B351 Tinea unguium: Secondary | ICD-10-CM

## 2020-03-26 NOTE — Progress Notes (Signed)
  Subjective:  Patient ID: Lorin Picket., male    DOB: 23-Aug-1931,  MRN: [id number]  Chief Complaint  Patient presents with  . Toe Injury    fell two weeks walking in grass and the left big toe was injured and saw Dr Delena Bali    86 y.o. male presents with the above complaint. History confirmed with patient.   Objective:  Physical Exam: warm, good capillary refill, nail exam onychomycosis of the toenails, no trophic changes or ulcerative lesions. Left great toenail distal lysis. DP pulses palpable, PT pulses palpable, protective sensation absent and vibratory sensation diminished hammertoes bilat   Assessment:   1. Onychomycosis of multiple toenails with type 2 diabetes mellitus and peripheral neuropathy ([place])      Plan:  Patient was evaluated and treated and all questions answered.  Onychomycosis, Diabetes, DPN and Coagulation Defect -Nails debrided as below  Procedure: Nail Debridement Rationale: Patient meets criteria for routine foot care due to DPN Type of Debridement: manual, sharp debridement. Instrumentation: Nail nipper, rotary burr. Number of Nails: 10     No follow-ups on file.

## 2020-04-07 DIAGNOSIS — R69 Illness, unspecified: Secondary | ICD-10-CM | POA: Diagnosis not present

## 2020-05-07 DEATH — deceased

## 2020-06-18 ENCOUNTER — Ambulatory Visit (INDEPENDENT_AMBULATORY_CARE_PROVIDER_SITE_OTHER): Payer: Medicare HMO | Admitting: Podiatry

## 2020-06-18 DIAGNOSIS — Z5329 Procedure and treatment not carried out because of patient's decision for other reasons: Secondary | ICD-10-CM

## 2020-06-18 NOTE — Progress Notes (Signed)
No show for appt.
